# Patient Record
Sex: Male | Born: 1940 | Race: White | Hispanic: No | Marital: Married | Smoking: Former smoker
Health system: Southern US, Community
[De-identification: ages and names within clinical notes are randomized; demographics above are authoritative.]

## PROBLEM LIST (undated history)

## (undated) DIAGNOSIS — E785 Hyperlipidemia, unspecified: Secondary | ICD-10-CM

## (undated) DIAGNOSIS — I4891 Unspecified atrial fibrillation: Secondary | ICD-10-CM

## (undated) DIAGNOSIS — I10 Essential (primary) hypertension: Secondary | ICD-10-CM

---

## 2002-01-16 ENCOUNTER — Encounter (HOSPITAL_COMMUNITY): Admission: RE | Admit: 2002-01-16 | Discharge: 2002-04-16 | Payer: Self-pay | Admitting: Internal Medicine

## 2002-07-09 ENCOUNTER — Ambulatory Visit (HOSPITAL_COMMUNITY): Admission: RE | Admit: 2002-07-09 | Discharge: 2002-07-09 | Payer: Self-pay | Admitting: Gastroenterology

## 2002-07-09 ENCOUNTER — Encounter (INDEPENDENT_AMBULATORY_CARE_PROVIDER_SITE_OTHER): Payer: Self-pay | Admitting: Specialist

## 2002-08-31 ENCOUNTER — Encounter (HOSPITAL_COMMUNITY): Admission: RE | Admit: 2002-08-31 | Discharge: 2002-11-29 | Payer: Self-pay | Admitting: Internal Medicine

## 2003-03-31 ENCOUNTER — Encounter (HOSPITAL_COMMUNITY): Admission: RE | Admit: 2003-03-31 | Discharge: 2003-06-29 | Payer: Self-pay | Admitting: Internal Medicine

## 2003-10-01 ENCOUNTER — Encounter (HOSPITAL_COMMUNITY): Admission: RE | Admit: 2003-10-01 | Discharge: 2003-12-30 | Payer: Self-pay | Admitting: Internal Medicine

## 2004-06-28 ENCOUNTER — Encounter (HOSPITAL_COMMUNITY): Admission: RE | Admit: 2004-06-28 | Discharge: 2004-09-26 | Payer: Self-pay | Admitting: Internal Medicine

## 2005-03-12 ENCOUNTER — Encounter (HOSPITAL_COMMUNITY): Admission: RE | Admit: 2005-03-12 | Discharge: 2005-03-13 | Payer: Self-pay | Admitting: Internal Medicine

## 2005-07-27 ENCOUNTER — Encounter (HOSPITAL_COMMUNITY): Admission: RE | Admit: 2005-07-27 | Discharge: 2005-10-25 | Payer: Self-pay | Admitting: Internal Medicine

## 2006-01-24 ENCOUNTER — Ambulatory Visit (HOSPITAL_COMMUNITY): Admission: RE | Admit: 2006-01-24 | Discharge: 2006-01-24 | Payer: Self-pay | Admitting: Internal Medicine

## 2006-07-19 ENCOUNTER — Encounter (HOSPITAL_COMMUNITY): Admission: RE | Admit: 2006-07-19 | Discharge: 2006-10-08 | Payer: Self-pay | Admitting: Internal Medicine

## 2006-10-11 ENCOUNTER — Encounter (HOSPITAL_COMMUNITY): Admission: RE | Admit: 2006-10-11 | Discharge: 2007-01-09 | Payer: Self-pay | Admitting: Internal Medicine

## 2007-03-07 ENCOUNTER — Encounter (HOSPITAL_COMMUNITY): Admission: RE | Admit: 2007-03-07 | Discharge: 2007-05-05 | Payer: Self-pay | Admitting: Internal Medicine

## 2007-05-20 ENCOUNTER — Encounter (HOSPITAL_COMMUNITY): Admission: RE | Admit: 2007-05-20 | Discharge: 2007-08-01 | Payer: Self-pay | Admitting: Internal Medicine

## 2007-09-17 ENCOUNTER — Encounter (HOSPITAL_COMMUNITY): Admission: RE | Admit: 2007-09-17 | Discharge: 2007-09-25 | Payer: Self-pay | Admitting: Internal Medicine

## 2008-01-23 ENCOUNTER — Encounter (HOSPITAL_COMMUNITY): Admission: RE | Admit: 2008-01-23 | Discharge: 2008-03-31 | Payer: Self-pay | Admitting: Internal Medicine

## 2008-04-27 ENCOUNTER — Encounter (HOSPITAL_COMMUNITY): Admission: RE | Admit: 2008-04-27 | Discharge: 2008-07-07 | Payer: Self-pay | Admitting: Internal Medicine

## 2008-08-26 ENCOUNTER — Encounter (HOSPITAL_COMMUNITY): Admission: RE | Admit: 2008-08-26 | Discharge: 2008-11-24 | Payer: Self-pay | Admitting: Internal Medicine

## 2009-01-04 ENCOUNTER — Encounter (HOSPITAL_COMMUNITY): Admission: RE | Admit: 2009-01-04 | Discharge: 2009-03-14 | Payer: Self-pay | Admitting: Internal Medicine

## 2009-04-27 ENCOUNTER — Encounter (HOSPITAL_COMMUNITY): Admission: RE | Admit: 2009-04-27 | Discharge: 2009-07-26 | Payer: Self-pay | Admitting: Internal Medicine

## 2009-10-20 ENCOUNTER — Encounter (HOSPITAL_COMMUNITY): Admission: RE | Admit: 2009-10-20 | Discharge: 2010-01-04 | Payer: Self-pay | Admitting: Internal Medicine

## 2010-04-27 ENCOUNTER — Encounter (HOSPITAL_COMMUNITY): Admission: RE | Admit: 2010-04-27 | Discharge: 2010-06-29 | Payer: Self-pay | Admitting: Internal Medicine

## 2010-10-12 ENCOUNTER — Ambulatory Visit (HOSPITAL_COMMUNITY): Payer: Medicare Other | Attending: Internal Medicine

## 2010-11-13 LAB — CBC
HCT: 45.6 % (ref 39.0–52.0)
Hemoglobin: 15.6 g/dL (ref 13.0–17.0)
MCHC: 34.3 g/dL (ref 30.0–36.0)
MCV: 102.3 fL — ABNORMAL HIGH (ref 78.0–100.0)
Platelets: 170 10*3/uL (ref 150–400)
RBC: 4.46 MIL/uL (ref 4.22–5.81)
RDW: 13.1 % (ref 11.5–15.5)
WBC: 5.2 10*3/uL (ref 4.0–10.5)

## 2011-02-02 ENCOUNTER — Encounter (HOSPITAL_COMMUNITY): Payer: Medicare Other | Attending: Internal Medicine

## 2011-04-26 LAB — DIFFERENTIAL
Basophils Absolute: 0
Basophils Relative: 1
Eosinophils Absolute: 0.2
Eosinophils Relative: 4
Lymphocytes Relative: 31
Lymphs Abs: 1.6
Monocytes Absolute: 0.5
Monocytes Relative: 9
Neutro Abs: 2.9
Neutrophils Relative %: 55

## 2011-04-26 LAB — CBC
HCT: 43.9
Hemoglobin: 15.6
MCHC: 35.5
MCV: 100.5 — ABNORMAL HIGH
Platelets: 167
RBC: 4.37
RDW: 13.2
WBC: 5.2

## 2011-04-30 LAB — CBC
HCT: 46.9
Hemoglobin: 16
MCHC: 34.1
MCV: 102.9 — ABNORMAL HIGH
Platelets: 156
RBC: 4.56
RDW: 13
WBC: 5.8

## 2011-05-09 LAB — CBC
HCT: 43.7
Hemoglobin: 15.3
MCHC: 35
MCV: 99.8
Platelets: 169
RBC: 4.38
RDW: 13
WBC: 5.9

## 2011-05-25 ENCOUNTER — Ambulatory Visit (HOSPITAL_COMMUNITY)
Admission: RE | Admit: 2011-05-25 | Discharge: 2011-05-25 | Disposition: A | Discharge status: Home / Self Care | Payer: Medicare Other | Source: Ambulatory Visit | Attending: Internal Medicine | Admitting: Internal Medicine

## 2011-09-27 DIAGNOSIS — Z86010 Personal history of colonic polyps: Secondary | ICD-10-CM | POA: Diagnosis not present

## 2011-09-27 DIAGNOSIS — Z1331 Encounter for screening for depression: Secondary | ICD-10-CM | POA: Diagnosis not present

## 2011-09-27 DIAGNOSIS — I10 Essential (primary) hypertension: Secondary | ICD-10-CM | POA: Diagnosis not present

## 2011-09-27 DIAGNOSIS — E7800 Pure hypercholesterolemia, unspecified: Secondary | ICD-10-CM | POA: Diagnosis not present

## 2011-10-26 ENCOUNTER — Other Ambulatory Visit (HOSPITAL_COMMUNITY): Payer: Self-pay | Admitting: *Deleted

## 2011-10-30 ENCOUNTER — Encounter (HOSPITAL_COMMUNITY)
Admission: RE | Admit: 2011-10-30 | Discharge: 2011-10-30 | Disposition: A | Discharge status: Home / Self Care | Payer: Medicare Other | Source: Ambulatory Visit | Attending: Internal Medicine | Admitting: Internal Medicine

## 2011-10-30 MED ORDER — LIDOCAINE HCL (PF) 1 % IJ SOLN
INTRAMUSCULAR | Status: AC
  Filled 2011-10-30: qty 5

## 2011-10-30 MED ORDER — LIDOCAINE HCL 2 % IJ SOLN
0.1000 mL | INTRAMUSCULAR | Status: DC
  Filled 2011-10-30: qty 0.1

## 2011-10-30 NOTE — Progress Notes (Signed)
Left ac accessed for phlebotomy.  Numbed area with lidocaine prior to procedure per md order and removed 250cc of blood.  Pt tolerated procedure well.

## 2011-11-05 ENCOUNTER — Other Ambulatory Visit: Payer: Self-pay | Admitting: Dermatology

## 2011-11-05 DIAGNOSIS — L570 Actinic keratosis: Secondary | ICD-10-CM | POA: Diagnosis not present

## 2011-11-05 DIAGNOSIS — D485 Neoplasm of uncertain behavior of skin: Secondary | ICD-10-CM | POA: Diagnosis not present

## 2011-11-05 DIAGNOSIS — D239 Other benign neoplasm of skin, unspecified: Secondary | ICD-10-CM | POA: Diagnosis not present

## 2011-11-05 DIAGNOSIS — L820 Inflamed seborrheic keratosis: Secondary | ICD-10-CM | POA: Diagnosis not present

## 2011-11-05 DIAGNOSIS — Z85828 Personal history of other malignant neoplasm of skin: Secondary | ICD-10-CM | POA: Diagnosis not present

## 2011-11-19 ENCOUNTER — Other Ambulatory Visit: Payer: Self-pay | Admitting: Gastroenterology

## 2011-11-19 DIAGNOSIS — D126 Benign neoplasm of colon, unspecified: Secondary | ICD-10-CM | POA: Diagnosis not present

## 2011-11-19 DIAGNOSIS — Z09 Encounter for follow-up examination after completed treatment for conditions other than malignant neoplasm: Secondary | ICD-10-CM | POA: Diagnosis not present

## 2011-11-19 DIAGNOSIS — D129 Benign neoplasm of anus and anal canal: Secondary | ICD-10-CM | POA: Diagnosis not present

## 2011-11-19 DIAGNOSIS — Z86010 Personal history of colonic polyps: Secondary | ICD-10-CM | POA: Diagnosis not present

## 2011-11-19 DIAGNOSIS — D128 Benign neoplasm of rectum: Secondary | ICD-10-CM | POA: Diagnosis not present

## 2012-02-14 ENCOUNTER — Other Ambulatory Visit: Payer: Self-pay | Admitting: Internal Medicine

## 2012-02-15 ENCOUNTER — Encounter (HOSPITAL_COMMUNITY)
Admission: RE | Admit: 2012-02-15 | Discharge: 2012-02-15 | Disposition: A | Discharge status: Home / Self Care | Payer: Medicare Other | Source: Ambulatory Visit | Attending: Internal Medicine | Admitting: Internal Medicine

## 2012-02-15 LAB — POCT HEMOGLOBIN-HEMACUE: Hemoglobin: 15.2 g/dL (ref 13.0–17.0)

## 2012-02-15 MED ORDER — LIDOCAINE HCL 1 % IJ SOLN
2.0000 mL | Freq: Once | INTRAMUSCULAR | Status: DC
  Filled 2012-02-15: qty 2

## 2012-02-15 MED ORDER — LIDOCAINE HCL (PF) 1 % IJ SOLN
INTRAMUSCULAR | Status: AC
  Administered 2012-02-15: 2 mL via INTRADERMAL
  Filled 2012-02-15: qty 2

## 2012-02-15 NOTE — Progress Notes (Signed)
Phelbotomized 250 ml blood from left A/C after numbing with 1% lidocaine. Tol well.

## 2012-04-01 DIAGNOSIS — E7800 Pure hypercholesterolemia, unspecified: Secondary | ICD-10-CM | POA: Diagnosis not present

## 2012-04-01 DIAGNOSIS — M7660 Achilles tendinitis, unspecified leg: Secondary | ICD-10-CM | POA: Diagnosis not present

## 2012-04-01 DIAGNOSIS — I10 Essential (primary) hypertension: Secondary | ICD-10-CM | POA: Diagnosis not present

## 2012-04-09 DIAGNOSIS — M25579 Pain in unspecified ankle and joints of unspecified foot: Secondary | ICD-10-CM | POA: Diagnosis not present

## 2012-06-19 ENCOUNTER — Other Ambulatory Visit (HOSPITAL_COMMUNITY): Payer: Self-pay | Admitting: *Deleted

## 2012-06-20 ENCOUNTER — Ambulatory Visit (HOSPITAL_COMMUNITY)
Admission: RE | Admit: 2012-06-20 | Discharge: 2012-06-20 | Disposition: A | Discharge status: Home / Self Care | Payer: Medicare Other | Source: Ambulatory Visit | Attending: Internal Medicine | Admitting: Internal Medicine

## 2012-06-20 DIAGNOSIS — Z5189 Encounter for other specified aftercare: Secondary | ICD-10-CM | POA: Insufficient documentation

## 2012-06-20 LAB — POCT HEMOGLOBIN-HEMACUE: Hemoglobin: 16.9 g/dL (ref 13.0–17.0)

## 2012-06-20 MED ORDER — LIDOCAINE HCL (PF) 1 % IJ SOLN: 2.0000 mL | Freq: Once | INTRAMUSCULAR | Status: DC

## 2012-06-20 NOTE — Progress Notes (Signed)
HGB 16.9 per Hemocue: Theraputic phlebotomy complete per order accessing right antecub.; pt. Tolerated well

## 2012-08-20 ENCOUNTER — Other Ambulatory Visit: Payer: Self-pay | Admitting: Dermatology

## 2012-08-20 DIAGNOSIS — D0420 Carcinoma in situ of skin of unspecified ear and external auricular canal: Secondary | ICD-10-CM | POA: Diagnosis not present

## 2012-10-01 ENCOUNTER — Other Ambulatory Visit: Payer: Self-pay | Admitting: Internal Medicine

## 2012-10-01 DIAGNOSIS — I10 Essential (primary) hypertension: Secondary | ICD-10-CM | POA: Diagnosis not present

## 2012-10-23 DIAGNOSIS — D0420 Carcinoma in situ of skin of unspecified ear and external auricular canal: Secondary | ICD-10-CM | POA: Diagnosis not present

## 2012-10-30 ENCOUNTER — Other Ambulatory Visit: Payer: Self-pay | Admitting: Internal Medicine

## 2012-11-03 ENCOUNTER — Other Ambulatory Visit: Payer: Self-pay | Admitting: Internal Medicine

## 2012-11-06 ENCOUNTER — Inpatient Hospital Stay (HOSPITAL_COMMUNITY): Admission: RE | Admit: 2012-11-06 | Payer: Medicare Other | Source: Ambulatory Visit

## 2012-11-06 ENCOUNTER — Encounter (HOSPITAL_COMMUNITY)
Admission: RE | Admit: 2012-11-06 | Discharge: 2012-11-06 | Disposition: A | Discharge status: Home / Self Care | Payer: Medicare Other | Source: Ambulatory Visit | Attending: Internal Medicine | Admitting: Internal Medicine

## 2012-11-06 LAB — POCT HEMOGLOBIN-HEMACUE: Hemoglobin: 14.7 g/dL (ref 13.0–17.0)

## 2012-11-06 MED ORDER — LIDOCAINE HCL 2 % IJ SOLN
0.1000 mL | Freq: Once | INTRAMUSCULAR | Status: DC
  Filled 2012-11-06: qty 10

## 2012-11-06 MED ORDER — LIDOCAINE HCL (PF) 1 % IJ SOLN
0.1000 mL | Freq: Once | INTRAMUSCULAR | Status: AC
  Administered 2012-11-06: 0.1 mL via INTRADERMAL

## 2012-11-06 MED ORDER — LIDOCAINE HCL (PF) 1 % IJ SOLN
INTRAMUSCULAR | Status: AC
  Filled 2012-11-06: qty 2

## 2012-11-06 NOTE — Progress Notes (Signed)
Phlebotomy orders entered into [facility] by MD's office in the discharge orders.   Performed hemocue on patient per order.  Hgb 14.7.  Proceeded with phlebotomy, first injected lidocaine to Right AC, then phlebotomized 450cc of blood waster per order. Patient tolerated well.  VSS.

## 2013-02-19 ENCOUNTER — Other Ambulatory Visit: Payer: Self-pay | Admitting: Internal Medicine

## 2013-02-20 ENCOUNTER — Encounter (HOSPITAL_COMMUNITY)
Admission: RE | Admit: 2013-02-20 | Discharge: 2013-02-20 | Disposition: A | Discharge status: Home / Self Care | Payer: Medicare Other | Source: Ambulatory Visit | Attending: Internal Medicine | Admitting: Internal Medicine

## 2013-02-20 MED ORDER — LIDOCAINE HCL (PF) 1 % IJ SOLN
INTRAMUSCULAR | Status: AC
  Administered 2013-02-20: 2 mL
  Filled 2013-02-20: qty 2

## 2013-02-23 LAB — POCT HEMOGLOBIN-HEMACUE: Hemoglobin: 15.5 g/dL (ref 13.0–17.0)

## 2013-04-08 DIAGNOSIS — I10 Essential (primary) hypertension: Secondary | ICD-10-CM | POA: Diagnosis not present

## 2013-04-16 DIAGNOSIS — S9030XA Contusion of unspecified foot, initial encounter: Secondary | ICD-10-CM | POA: Diagnosis not present

## 2013-05-19 ENCOUNTER — Encounter (HOSPITAL_COMMUNITY)
Admission: RE | Admit: 2013-05-19 | Discharge: 2013-05-19 | Disposition: A | Discharge status: Home / Self Care | Payer: Medicare Other | Source: Ambulatory Visit | Attending: Internal Medicine | Admitting: Internal Medicine

## 2013-05-19 MED ORDER — LIDOCAINE HCL (PF) 1 % IJ SOLN: 2.0000 mL | Freq: Once | INTRAMUSCULAR | Status: AC

## 2013-05-19 MED ORDER — LIDOCAINE HCL (PF) 1 % IJ SOLN
INTRAMUSCULAR | Status: AC
  Administered 2013-05-19: 2 mL via INTRADERMAL
  Filled 2013-05-19: qty 2

## 2013-05-19 NOTE — Progress Notes (Addendum)
Numbed rt AC per pt request with lidacaine and order 500cc blood withdrawn pt tolerated well. HGB 16.1

## 2013-05-20 LAB — POCT HEMOGLOBIN-HEMACUE: Hemoglobin: 16.1 g/dL (ref 13.0–17.0)

## 2013-07-15 ENCOUNTER — Other Ambulatory Visit (HOSPITAL_COMMUNITY): Payer: Self-pay | Admitting: *Deleted

## 2013-07-16 ENCOUNTER — Encounter (HOSPITAL_COMMUNITY)
Admission: RE | Admit: 2013-07-16 | Discharge: 2013-07-16 | Disposition: A | Discharge status: Home / Self Care | Payer: Medicare Other | Source: Ambulatory Visit | Attending: Internal Medicine | Admitting: Internal Medicine

## 2013-07-16 LAB — POCT HEMOGLOBIN-HEMACUE: Hemoglobin: 15.6 g/dL (ref 13.0–17.0)

## 2013-07-16 MED ORDER — LIDOCAINE HCL (PF) 1 % IJ SOLN: 2.0000 mL | Freq: Once | INTRAMUSCULAR | Status: DC

## 2013-07-16 MED ORDER — LIDOCAINE HCL (PF) 1 % IJ SOLN
INTRAMUSCULAR | Status: AC
  Administered 2013-07-16: 0.1 mL via INTRADERMAL
  Filled 2013-07-16: qty 2

## 2013-07-16 NOTE — Progress Notes (Signed)
Hgb 15.6.  Lidocaine used in Right AC at 0950 for therapeutic phlebotomy.  Removed of blood waste.  Patient tolerated well.

## 2013-09-16 ENCOUNTER — Encounter (HOSPITAL_COMMUNITY)
Admission: RE | Admit: 2013-09-16 | Discharge: 2013-09-16 | Disposition: A | Discharge status: Home / Self Care | Payer: Medicare Other | Source: Ambulatory Visit | Attending: Internal Medicine | Admitting: Internal Medicine

## 2013-09-16 LAB — POCT HEMOGLOBIN-HEMACUE: Hemoglobin: 15.2 g/dL (ref 13.0–17.0)

## 2013-09-16 MED ORDER — LIDOCAINE HCL (PF) 1 % IJ SOLN
INTRAMUSCULAR | Status: AC
  Administered 2013-09-16: 0.2 mL via INTRADERMAL
  Filled 2013-09-16: qty 2

## 2013-09-16 MED ORDER — LIDOCAINE HCL 1 % IJ SOLN
0.2000 mg | Freq: Once | INTRAMUSCULAR | Status: DC
  Filled 2013-09-16: qty 0.02

## 2013-09-16 NOTE — Progress Notes (Signed)
1000 500 cc phlebotomy right antecubital after lidocaine 0.2mg . Patient tolerated well.  Taking po fluids

## 2013-11-05 DIAGNOSIS — R059 Cough, unspecified: Secondary | ICD-10-CM | POA: Diagnosis not present

## 2013-11-05 DIAGNOSIS — R05 Cough: Secondary | ICD-10-CM | POA: Diagnosis not present

## 2013-11-05 DIAGNOSIS — Z23 Encounter for immunization: Secondary | ICD-10-CM | POA: Diagnosis not present

## 2013-11-05 DIAGNOSIS — R21 Rash and other nonspecific skin eruption: Secondary | ICD-10-CM | POA: Diagnosis not present

## 2013-11-05 DIAGNOSIS — I10 Essential (primary) hypertension: Secondary | ICD-10-CM | POA: Diagnosis not present

## 2013-11-11 ENCOUNTER — Encounter (HOSPITAL_COMMUNITY)
Admission: RE | Admit: 2013-11-11 | Discharge: 2013-11-11 | Disposition: A | Discharge status: Home / Self Care | Payer: Medicare Other | Source: Ambulatory Visit | Attending: Internal Medicine | Admitting: Internal Medicine

## 2013-11-11 ENCOUNTER — Encounter (HOSPITAL_COMMUNITY): Payer: Medicare Other

## 2013-11-11 LAB — POCT HEMOGLOBIN-HEMACUE: Hemoglobin: 14.6 g/dL (ref 13.0–17.0)

## 2013-11-11 MED ORDER — LIDOCAINE HCL 1 % IJ SOLN
2.0000 mL | Freq: Once | INTRAMUSCULAR | Status: DC
  Filled 2013-11-11: qty 2

## 2013-11-11 MED ORDER — LIDOCAINE HCL (PF) 1 % IJ SOLN
INTRAMUSCULAR | Status: AC
  Administered 2013-11-11: 2 mL
  Filled 2013-11-11: qty 2

## 2013-11-11 NOTE — Progress Notes (Signed)
Attempted phlebotomy to right arm unsuccessfully.  Used lidocaine to left AC and phlebotomized 500cc of blood and pt tolerated procedure well.

## 2013-11-13 DIAGNOSIS — R233 Spontaneous ecchymoses: Secondary | ICD-10-CM | POA: Diagnosis not present

## 2013-11-13 DIAGNOSIS — R609 Edema, unspecified: Secondary | ICD-10-CM | POA: Diagnosis not present

## 2013-11-18 DIAGNOSIS — M79609 Pain in unspecified limb: Secondary | ICD-10-CM | POA: Diagnosis not present

## 2014-01-18 ENCOUNTER — Other Ambulatory Visit (HOSPITAL_COMMUNITY): Payer: Self-pay | Admitting: *Deleted

## 2014-01-19 ENCOUNTER — Encounter (HOSPITAL_COMMUNITY)
Admission: RE | Admit: 2014-01-19 | Discharge: 2014-01-19 | Disposition: A | Discharge status: Home / Self Care | Payer: Medicare Other | Source: Ambulatory Visit | Attending: Internal Medicine | Admitting: Internal Medicine

## 2014-01-19 LAB — POCT HEMOGLOBIN-HEMACUE: Hemoglobin: 15.2 g/dL (ref 13.0–17.0)

## 2014-01-19 MED ORDER — LIDOCAINE HCL (PF) 1 % IJ SOLN
INTRAMUSCULAR | Status: AC
  Administered 2014-01-19: 2 mL
  Filled 2014-01-19: qty 2

## 2014-01-19 MED ORDER — LIDOCAINE HCL (PF) 1 % IJ SOLN: 2.0000 mL | Freq: Once | INTRAMUSCULAR | Status: DC

## 2014-01-19 NOTE — Progress Notes (Signed)
Therapeutic phlebotomy performed without complication.  500cc blood removed.  Pt tolerated with no c/o or complication.  Will monitor prior to discharge.  Po's offered.

## 2014-03-03 DIAGNOSIS — M25579 Pain in unspecified ankle and joints of unspecified foot: Secondary | ICD-10-CM | POA: Diagnosis not present

## 2014-03-24 ENCOUNTER — Other Ambulatory Visit (HOSPITAL_COMMUNITY): Payer: Self-pay

## 2014-03-25 ENCOUNTER — Encounter (HOSPITAL_COMMUNITY): Payer: Medicare Other

## 2014-04-01 ENCOUNTER — Encounter (HOSPITAL_COMMUNITY)
Admission: RE | Admit: 2014-04-01 | Discharge: 2014-04-01 | Disposition: A | Discharge status: Home / Self Care | Payer: Medicare Other | Source: Ambulatory Visit | Attending: Internal Medicine | Admitting: Internal Medicine

## 2014-04-01 LAB — POCT HEMOGLOBIN-HEMACUE: Hemoglobin: 13.9 g/dL (ref 13.0–17.0)

## 2014-04-01 MED ORDER — LIDOCAINE HCL (PF) 1 % IJ SOLN: 2.0000 mL | Freq: Once | INTRAMUSCULAR | Status: AC | PRN

## 2014-04-01 MED ORDER — LIDOCAINE HCL (PF) 1 % IJ SOLN
INTRAMUSCULAR | Status: AC
  Administered 2014-04-01: 2 mL via INTRADERMAL
  Filled 2014-04-01: qty 2

## 2014-04-01 NOTE — Progress Notes (Signed)
500 cc phlebotomy right lateral AC.  Patient tolerated well.  Taking po fluids.

## 2014-05-10 ENCOUNTER — Ambulatory Visit
Admission: RE | Admit: 2014-05-10 | Discharge: 2014-05-10 | Disposition: A | Discharge status: Home / Self Care | Payer: Medicare Other | Source: Ambulatory Visit | Attending: Nurse Practitioner | Admitting: Nurse Practitioner

## 2014-05-10 ENCOUNTER — Other Ambulatory Visit: Payer: Self-pay | Admitting: Nurse Practitioner

## 2014-05-10 DIAGNOSIS — R05 Cough: Secondary | ICD-10-CM

## 2014-05-10 DIAGNOSIS — J189 Pneumonia, unspecified organism: Secondary | ICD-10-CM | POA: Diagnosis not present

## 2014-05-10 DIAGNOSIS — I10 Essential (primary) hypertension: Secondary | ICD-10-CM | POA: Diagnosis not present

## 2014-05-10 DIAGNOSIS — J181 Lobar pneumonia, unspecified organism: Secondary | ICD-10-CM | POA: Diagnosis not present

## 2014-05-10 DIAGNOSIS — R21 Rash and other nonspecific skin eruption: Secondary | ICD-10-CM | POA: Diagnosis not present

## 2014-05-10 DIAGNOSIS — R059 Cough, unspecified: Secondary | ICD-10-CM

## 2014-05-10 DIAGNOSIS — Z23 Encounter for immunization: Secondary | ICD-10-CM | POA: Diagnosis not present

## 2014-05-17 DIAGNOSIS — J189 Pneumonia, unspecified organism: Secondary | ICD-10-CM | POA: Diagnosis not present

## 2014-05-17 DIAGNOSIS — H6690 Otitis media, unspecified, unspecified ear: Secondary | ICD-10-CM | POA: Diagnosis not present

## 2014-06-04 ENCOUNTER — Other Ambulatory Visit (HOSPITAL_COMMUNITY): Payer: Self-pay | Admitting: *Deleted

## 2014-06-07 ENCOUNTER — Other Ambulatory Visit: Payer: Self-pay | Admitting: Nurse Practitioner

## 2014-06-07 ENCOUNTER — Encounter (HOSPITAL_COMMUNITY)
Admission: RE | Admit: 2014-06-07 | Discharge: 2014-06-07 | Disposition: A | Discharge status: Home / Self Care | Payer: Medicare Other | Source: Ambulatory Visit | Attending: Internal Medicine | Admitting: Internal Medicine

## 2014-06-07 ENCOUNTER — Ambulatory Visit
Admission: RE | Admit: 2014-06-07 | Discharge: 2014-06-07 | Disposition: A | Discharge status: Home / Self Care | Payer: Medicare Other | Source: Ambulatory Visit | Attending: Nurse Practitioner | Admitting: Nurse Practitioner

## 2014-06-07 DIAGNOSIS — I10 Essential (primary) hypertension: Secondary | ICD-10-CM

## 2014-06-07 DIAGNOSIS — R918 Other nonspecific abnormal finding of lung field: Secondary | ICD-10-CM | POA: Diagnosis not present

## 2014-06-07 DIAGNOSIS — J189 Pneumonia, unspecified organism: Secondary | ICD-10-CM | POA: Diagnosis not present

## 2014-06-07 LAB — POCT HEMOGLOBIN-HEMACUE: Hemoglobin: 15.5 g/dL (ref 13.0–17.0)

## 2014-06-07 MED ORDER — LIDOCAINE HCL (PF) 1 % IJ SOLN
2.0000 mL | Freq: Once | INTRAMUSCULAR | Status: AC | PRN
  Administered 2014-06-07: 2 mL via INTRADERMAL

## 2014-06-07 MED ORDER — LIDOCAINE HCL (PF) 1 % IJ SOLN
INTRAMUSCULAR | Status: DC
Start: 2014-06-07 — End: 2014-06-08
  Filled 2014-06-07: qty 2

## 2014-06-07 NOTE — Progress Notes (Signed)
Phlebotomy complete using right antecub.  500 ml; pt. Tolerated well

## 2014-07-29 ENCOUNTER — Other Ambulatory Visit: Payer: Self-pay | Admitting: Internal Medicine

## 2014-07-29 ENCOUNTER — Ambulatory Visit
Admission: RE | Admit: 2014-07-29 | Discharge: 2014-07-29 | Disposition: A | Discharge status: Home / Self Care | Payer: Medicare Other | Source: Ambulatory Visit | Attending: Internal Medicine | Admitting: Internal Medicine

## 2014-07-29 DIAGNOSIS — J189 Pneumonia, unspecified organism: Secondary | ICD-10-CM

## 2014-07-29 DIAGNOSIS — J449 Chronic obstructive pulmonary disease, unspecified: Secondary | ICD-10-CM | POA: Diagnosis not present

## 2014-08-09 ENCOUNTER — Other Ambulatory Visit (HOSPITAL_COMMUNITY): Payer: Self-pay | Admitting: *Deleted

## 2014-08-10 ENCOUNTER — Encounter (HOSPITAL_COMMUNITY)
Admission: RE | Admit: 2014-08-10 | Discharge: 2014-08-10 | Disposition: A | Discharge status: Home / Self Care | Payer: Medicare Other | Source: Ambulatory Visit | Attending: Internal Medicine | Admitting: Internal Medicine

## 2014-08-10 LAB — POCT HEMOGLOBIN-HEMACUE: Hemoglobin: 15.4 g/dL (ref 13.0–17.0)

## 2014-08-10 MED ORDER — LIDOCAINE HCL (PF) 1 % IJ SOLN
INTRAMUSCULAR | Status: AC
  Administered 2014-08-10: 0.3 mL via INTRADERMAL
  Filled 2014-08-10: qty 2

## 2014-08-10 MED ORDER — LIDOCAINE HCL (PF) 1 % IJ SOLN
2.0000 mL | Freq: Once | INTRAMUSCULAR | Status: AC
  Administered 2014-08-10: 0.3 mL via INTRADERMAL

## 2014-08-10 NOTE — Progress Notes (Signed)
1015 500 cc phlebotomy right antecubital fossae after ID lidocaine.  Patient tolerated well.  Taking po fluids

## 2014-10-05 DIAGNOSIS — R310 Gross hematuria: Secondary | ICD-10-CM | POA: Diagnosis not present

## 2014-10-05 DIAGNOSIS — R351 Nocturia: Secondary | ICD-10-CM | POA: Diagnosis not present

## 2014-10-05 DIAGNOSIS — N390 Urinary tract infection, site not specified: Secondary | ICD-10-CM | POA: Diagnosis not present

## 2014-10-05 DIAGNOSIS — N281 Cyst of kidney, acquired: Secondary | ICD-10-CM | POA: Diagnosis not present

## 2014-10-05 DIAGNOSIS — N401 Enlarged prostate with lower urinary tract symptoms: Secondary | ICD-10-CM | POA: Diagnosis not present

## 2014-10-06 ENCOUNTER — Other Ambulatory Visit (HOSPITAL_COMMUNITY): Payer: Self-pay | Admitting: *Deleted

## 2014-10-07 ENCOUNTER — Inpatient Hospital Stay (HOSPITAL_COMMUNITY): Admission: RE | Admit: 2014-10-07 | Payer: Medicare Other | Source: Ambulatory Visit

## 2014-10-07 DIAGNOSIS — N390 Urinary tract infection, site not specified: Secondary | ICD-10-CM | POA: Diagnosis not present

## 2014-10-07 DIAGNOSIS — R310 Gross hematuria: Secondary | ICD-10-CM | POA: Diagnosis not present

## 2014-10-15 DIAGNOSIS — R310 Gross hematuria: Secondary | ICD-10-CM | POA: Diagnosis not present

## 2014-10-15 DIAGNOSIS — I700 Atherosclerosis of aorta: Secondary | ICD-10-CM | POA: Diagnosis not present

## 2014-10-15 DIAGNOSIS — R591 Generalized enlarged lymph nodes: Secondary | ICD-10-CM | POA: Diagnosis not present

## 2014-11-09 ENCOUNTER — Encounter (HOSPITAL_COMMUNITY)
Admission: RE | Admit: 2014-11-09 | Discharge: 2014-11-09 | Disposition: A | Discharge status: Home / Self Care | Payer: Medicare Other | Source: Ambulatory Visit | Attending: Internal Medicine | Admitting: Internal Medicine

## 2014-11-09 LAB — POCT HEMOGLOBIN-HEMACUE: Hemoglobin: 14.5 g/dL (ref 13.0–17.0)

## 2014-11-09 MED ORDER — LIDOCAINE HCL (PF) 1 % IJ SOLN
2.0000 mL | Freq: Once | INTRAMUSCULAR | Status: AC
  Administered 2014-11-09: 2 mL via INTRADERMAL

## 2014-11-09 MED ORDER — LIDOCAINE HCL (PF) 1 % IJ SOLN
INTRAMUSCULAR | Status: AC
  Filled 2014-11-09: qty 2

## 2014-11-09 NOTE — Progress Notes (Signed)
500 ml phlebotomy completed using right antecubital fossoe; pt. Tolerated well; HGB per hemocue 14.5 prior to phlebotomy

## 2014-11-12 DIAGNOSIS — R351 Nocturia: Secondary | ICD-10-CM | POA: Diagnosis not present

## 2014-11-12 DIAGNOSIS — N401 Enlarged prostate with lower urinary tract symptoms: Secondary | ICD-10-CM | POA: Diagnosis not present

## 2014-11-30 DIAGNOSIS — Z86010 Personal history of colonic polyps: Secondary | ICD-10-CM | POA: Diagnosis not present

## 2014-11-30 DIAGNOSIS — Z0000 Encounter for general adult medical examination without abnormal findings: Secondary | ICD-10-CM | POA: Diagnosis not present

## 2014-11-30 DIAGNOSIS — I10 Essential (primary) hypertension: Secondary | ICD-10-CM | POA: Diagnosis not present

## 2014-11-30 DIAGNOSIS — E780 Pure hypercholesterolemia: Secondary | ICD-10-CM | POA: Diagnosis not present

## 2015-02-08 ENCOUNTER — Encounter (HOSPITAL_COMMUNITY): Payer: Medicare Other

## 2015-02-10 ENCOUNTER — Encounter (HOSPITAL_COMMUNITY): Payer: Medicare Other

## 2015-02-11 ENCOUNTER — Other Ambulatory Visit (HOSPITAL_COMMUNITY): Payer: Self-pay | Admitting: *Deleted

## 2015-02-14 ENCOUNTER — Encounter (HOSPITAL_COMMUNITY)
Admission: RE | Admit: 2015-02-14 | Discharge: 2015-02-14 | Disposition: A | Discharge status: Home / Self Care | Payer: Medicare Other | Source: Ambulatory Visit | Attending: Internal Medicine | Admitting: Internal Medicine

## 2015-02-14 LAB — POCT HEMOGLOBIN-HEMACUE: Hemoglobin: 16.6 g/dL (ref 13.0–17.0)

## 2015-02-14 MED ORDER — LIDOCAINE HCL (PF) 1 % IJ SOLN
INTRAMUSCULAR | Status: AC
  Filled 2015-02-14: qty 2

## 2015-02-14 MED ORDER — LIDOCAINE HCL (PF) 1 % IJ SOLN
2.0000 mL | Freq: Once | INTRAMUSCULAR | Status: AC
  Administered 2015-02-14: 0.2 mL via INTRADERMAL

## 2015-02-14 NOTE — Progress Notes (Signed)
Phlebotomy of 500cc via left antecubital hemocue >11; tolerated well

## 2015-05-10 ENCOUNTER — Encounter (HOSPITAL_COMMUNITY): Payer: Medicare Other

## 2015-05-13 ENCOUNTER — Other Ambulatory Visit (HOSPITAL_COMMUNITY): Payer: Self-pay | Admitting: *Deleted

## 2015-05-17 ENCOUNTER — Encounter (HOSPITAL_COMMUNITY)
Admission: RE | Admit: 2015-05-17 | Discharge: 2015-05-17 | Disposition: A | Discharge status: Home / Self Care | Payer: Medicare Other | Source: Ambulatory Visit | Attending: Internal Medicine | Admitting: Internal Medicine

## 2015-05-17 LAB — POCT HEMOGLOBIN-HEMACUE: Hemoglobin: 16.5 g/dL (ref 13.0–17.0)

## 2015-05-17 MED ORDER — LIDOCAINE HCL (PF) 1 % IJ SOLN
INTRAMUSCULAR | Status: AC
  Administered 2015-05-17: 2 mL via INTRADERMAL
  Filled 2015-05-17: qty 2

## 2015-06-02 DIAGNOSIS — Z23 Encounter for immunization: Secondary | ICD-10-CM | POA: Diagnosis not present

## 2015-06-02 DIAGNOSIS — H6123 Impacted cerumen, bilateral: Secondary | ICD-10-CM | POA: Diagnosis not present

## 2015-06-02 DIAGNOSIS — I10 Essential (primary) hypertension: Secondary | ICD-10-CM | POA: Diagnosis not present

## 2015-06-26 ENCOUNTER — Emergency Department (HOSPITAL_COMMUNITY)
Admission: EM | Admit: 2015-06-26 | Discharge: 2015-06-26 | Disposition: A | Discharge status: Home / Self Care | Payer: Medicare Other | Attending: Emergency Medicine | Admitting: Emergency Medicine

## 2015-06-26 ENCOUNTER — Encounter (HOSPITAL_COMMUNITY): Payer: Self-pay | Admitting: *Deleted

## 2015-06-26 ENCOUNTER — Emergency Department (HOSPITAL_COMMUNITY): Payer: Medicare Other

## 2015-06-26 DIAGNOSIS — S066X9A Traumatic subarachnoid hemorrhage with loss of consciousness of unspecified duration, initial encounter: Secondary | ICD-10-CM | POA: Diagnosis not present

## 2015-06-26 DIAGNOSIS — S066X0A Traumatic subarachnoid hemorrhage without loss of consciousness, initial encounter: Secondary | ICD-10-CM | POA: Insufficient documentation

## 2015-06-26 DIAGNOSIS — N390 Urinary tract infection, site not specified: Secondary | ICD-10-CM | POA: Diagnosis not present

## 2015-06-26 DIAGNOSIS — F10120 Alcohol abuse with intoxication, uncomplicated: Secondary | ICD-10-CM | POA: Insufficient documentation

## 2015-06-26 DIAGNOSIS — I10 Essential (primary) hypertension: Secondary | ICD-10-CM | POA: Insufficient documentation

## 2015-06-26 DIAGNOSIS — W1839XA Other fall on same level, initial encounter: Secondary | ICD-10-CM | POA: Insufficient documentation

## 2015-06-26 DIAGNOSIS — Z87891 Personal history of nicotine dependence: Secondary | ICD-10-CM | POA: Insufficient documentation

## 2015-06-26 DIAGNOSIS — Z79899 Other long term (current) drug therapy: Secondary | ICD-10-CM | POA: Insufficient documentation

## 2015-06-26 DIAGNOSIS — S61409A Unspecified open wound of unspecified hand, initial encounter: Secondary | ICD-10-CM | POA: Diagnosis not present

## 2015-06-26 DIAGNOSIS — Y998 Other external cause status: Secondary | ICD-10-CM | POA: Insufficient documentation

## 2015-06-26 DIAGNOSIS — S022XXA Fracture of nasal bones, initial encounter for closed fracture: Secondary | ICD-10-CM | POA: Diagnosis not present

## 2015-06-26 DIAGNOSIS — Y92000 Kitchen of unspecified non-institutional (private) residence as  the place of occurrence of the external cause: Secondary | ICD-10-CM | POA: Diagnosis not present

## 2015-06-26 DIAGNOSIS — S199XXA Unspecified injury of neck, initial encounter: Secondary | ICD-10-CM | POA: Diagnosis not present

## 2015-06-26 DIAGNOSIS — Y9389 Activity, other specified: Secondary | ICD-10-CM | POA: Diagnosis not present

## 2015-06-26 DIAGNOSIS — I609 Nontraumatic subarachnoid hemorrhage, unspecified: Secondary | ICD-10-CM

## 2015-06-26 DIAGNOSIS — F10920 Alcohol use, unspecified with intoxication, uncomplicated: Secondary | ICD-10-CM

## 2015-06-26 DIAGNOSIS — T148 Other injury of unspecified body region: Secondary | ICD-10-CM | POA: Diagnosis not present

## 2015-06-26 DIAGNOSIS — R55 Syncope and collapse: Secondary | ICD-10-CM | POA: Insufficient documentation

## 2015-06-26 DIAGNOSIS — S0990XA Unspecified injury of head, initial encounter: Secondary | ICD-10-CM | POA: Diagnosis present

## 2015-06-26 HISTORY — DX: Essential (primary) hypertension: I10

## 2015-06-26 LAB — CBC WITH DIFFERENTIAL/PLATELET
Basophils Absolute: 0 10*3/uL (ref 0.0–0.1)
Basophils Relative: 0 %
Eosinophils Absolute: 0.1 10*3/uL (ref 0.0–0.7)
Eosinophils Relative: 1 %
HCT: 45.1 % (ref 39.0–52.0)
Hemoglobin: 15.8 g/dL (ref 13.0–17.0)
Lymphocytes Relative: 16 %
Lymphs Abs: 1.5 10*3/uL (ref 0.7–4.0)
MCH: 35.9 pg — ABNORMAL HIGH (ref 26.0–34.0)
MCHC: 35 g/dL (ref 30.0–36.0)
MCV: 102.5 fL — ABNORMAL HIGH (ref 78.0–100.0)
Monocytes Absolute: 0.4 10*3/uL (ref 0.1–1.0)
Monocytes Relative: 5 %
Neutro Abs: 7.4 10*3/uL (ref 1.7–7.7)
Neutrophils Relative %: 78 %
Platelets: 170 10*3/uL (ref 150–400)
RBC: 4.4 MIL/uL (ref 4.22–5.81)
RDW: 13 % (ref 11.5–15.5)
WBC: 9.5 10*3/uL (ref 4.0–10.5)

## 2015-06-26 LAB — URINALYSIS, ROUTINE W REFLEX MICROSCOPIC
Bilirubin Urine: NEGATIVE
Glucose, UA: NEGATIVE mg/dL
Ketones, ur: NEGATIVE mg/dL
Nitrite: NEGATIVE
Protein, ur: NEGATIVE mg/dL
Specific Gravity, Urine: 1.011 (ref 1.005–1.030)
pH: 5.5 (ref 5.0–8.0)

## 2015-06-26 LAB — COMPREHENSIVE METABOLIC PANEL
ALT: 53 U/L (ref 17–63)
AST: 53 U/L — ABNORMAL HIGH (ref 15–41)
Albumin: 4.3 g/dL (ref 3.5–5.0)
Alkaline Phosphatase: 65 U/L (ref 38–126)
Anion gap: 8 (ref 5–15)
BUN: 14 mg/dL (ref 6–20)
CO2: 24 mmol/L (ref 22–32)
Calcium: 9.4 mg/dL (ref 8.9–10.3)
Chloride: 107 mmol/L (ref 101–111)
Creatinine, Ser: 0.94 mg/dL (ref 0.61–1.24)
GFR calc Af Amer: >60 mL/min (ref >60)
GFR calc non Af Amer: >60 mL/min (ref >60)
Glucose, Bld: 109 mg/dL — ABNORMAL HIGH (ref 65–99)
Potassium: 3.9 mmol/L (ref 3.5–5.1)
Sodium: 139 mmol/L (ref 135–145)
Total Bilirubin: 0.7 mg/dL (ref 0.3–1.2)
Total Protein: 7.6 g/dL (ref 6.5–8.1)

## 2015-06-26 LAB — URINE MICROSCOPIC-ADD ON
RBC / HPF: NONE SEEN RBC/hpf (ref 0–5)
Squamous Epithelial / LPF: NONE SEEN

## 2015-06-26 LAB — RAPID URINE DRUG SCREEN, HOSP PERFORMED
Amphetamines: NOT DETECTED
Barbiturates: NOT DETECTED
Benzodiazepines: NOT DETECTED
Cocaine: NOT DETECTED
Opiates: NOT DETECTED
Tetrahydrocannabinol: NOT DETECTED

## 2015-06-26 LAB — ETHANOL: Alcohol, Ethyl (B): 237 mg/dL — ABNORMAL HIGH (ref <5)

## 2015-06-26 LAB — TROPONIN I: Troponin I: <0.03 ng/mL (ref <0.031)

## 2015-06-26 MED ORDER — SODIUM CHLORIDE 0.9 % IV SOLN
INTRAVENOUS | Status: DC
  Administered 2015-06-26: 19:00:00 via INTRAVENOUS

## 2015-06-26 MED ORDER — HYDROCODONE-ACETAMINOPHEN 5-325 MG PO TABS: 1.0000 | ORAL_TABLET | ORAL | Status: DC | PRN

## 2015-06-26 MED ORDER — CEPHALEXIN 500 MG PO CAPS: 500.0000 mg | ORAL_CAPSULE | Freq: Four times a day (QID) | ORAL | Status: DC

## 2015-06-26 NOTE — ED Notes (Signed)
Cleaned blood from patient head no laceration noted.

## 2015-06-26 NOTE — ED Provider Notes (Signed)
CSN: [id number]     Arrival date & time 06/26/15  1751 History   First MD Initiated Contact with Patient 06/26/15 1815     Chief Complaint  Patient presents with  . Fall    with head injury      (Consider location/radiation/quality/duration/timing/severity/associated sxs/prior Treatment) HPI Comments: Patient here after having a syncopal event just prior to arrival. Patient is to drinking one [employer] and states that the next thing he knew he woke up on the floor. Did not have any food prior to drinking this. Denies any other drug ingestions. Would not specify how large the martini was. States he does drink daily. Patient denies any symptoms prior to the event. When he awoke he had swelling to the right side of his face as well as laceration to the left parietal scalp. Denies any pain from below the neck. No recent history of illnesses. No recent medication changes  Patient is a 74 y.o. male presenting with fall. The history is provided by the patient and the spouse.  Fall    Past Medical History  Diagnosis Date  . Hypertension    No past surgical history on file. History reviewed. No pertinent family history. Social History  Substance Use Topics  . Smoking status: Former Research scientist (life sciences)  . Smokeless tobacco: None  . Alcohol Use: 1.2 oz/week    2 Glasses of wine per week     Comment: 2 glasses wine daily     Review of Systems  All other systems reviewed and are negative.     Allergies  Review of patient's allergies indicates no known allergies.  Home Medications   Prior to Admission medications   Medication Sig Start Date End Date Taking? Authorizing Provider  amLODipine-atorvastatin (CADUET) 5-10 MG per tablet Take 1 tablet by mouth daily. 5 mg daily    Historical Provider, MD   BP 149/96 mmHg  Pulse 73  Temp(Src) 97.4 F (36.3 C) (Oral)  Ht 6' (1.829 m)  Wt 93.441 kg  BMI 27.93 kg/m2  SpO2 98% Physical Exam  Constitutional: He is oriented to person, place,  and time. He appears well-developed and well-nourished.  Non-toxic appearance. No distress.  HENT:  Head: Normocephalic and atraumatic.    Eyes: Conjunctivae, EOM and lids are normal. Pupils are equal, round, and reactive to light.  Patient denies diplopia. His extraocular muscles are intact  Neck: Normal range of motion. Neck supple. No spinous process tenderness and no muscular tenderness present. No tracheal deviation present. No thyroid mass present.  Cardiovascular: Normal rate, regular rhythm and normal heart sounds.  Exam reveals no gallop.   No murmur heard. Pulmonary/Chest: Effort normal and breath sounds normal. No stridor. No respiratory distress. He has no decreased breath sounds. He has no wheezes. He has no rhonchi. He has no rales.  Abdominal: Soft. Normal appearance and bowel sounds are normal. He exhibits no distension. There is no tenderness. There is no rebound and no CVA tenderness.  Musculoskeletal: Normal range of motion. He exhibits no edema or tenderness.  Neurological: He is alert and oriented to person, place, and time. He has normal strength. No cranial nerve deficit or sensory deficit. GCS eye subscore is 4. GCS verbal subscore is 5. GCS motor subscore is 6.  Skin: Skin is warm and dry. No abrasion and no rash noted.  Psychiatric: He has a normal mood and affect. His speech is normal and behavior is normal.  Nursing note and vitals reviewed.   ED Course  Procedures (including critical care time) Labs Review Labs Reviewed  CBC WITH DIFFERENTIAL/PLATELET  COMPREHENSIVE METABOLIC PANEL  ETHANOL  URINE RAPID DRUG SCREEN, HOSP PERFORMED  TROPONIN I  URINALYSIS, ROUTINE W REFLEX MICROSCOPIC (NOT AT [facility])    Imaging Review No results found. I have personally reviewed and evaluated these images and lab results as part of my medical decision-making.   EKG Interpretation   Date/Time:  Sunday June 26 2015 18:30:16 EST Ventricular Rate:  66 PR Interval:   216 QRS Duration: 88 QT Interval:  397 QTC Calculation: 416 R Axis:   23 Text Interpretation:  Sinus rhythm Atrial premature complex Borderline  prolonged PR interval Confirmed by Zenia Resides  MD, Keenan Dimitrov ([id number]) on  06/26/2015 7:04:11 PM      MDM   Final diagnoses:  None    Patient's head CT results noted. Discuss with neurosurgeon on call, Dr. Saintclair Halsted, will follow-up with patient in 2 days. Patient's nasal fracture noted in will be given referral to ENT on call. This is UTI to be treated with Keflex. Suspect the constipation syncope worse from excessive alcohol intake. He is not orthostatic here.  9:14 PM At time of discharge, patient is alert and oriented 3. The patient's right Eye was reexamined he has no evidence of hyphema. He has no visual complaints of at this time.   [name], MD 06/26/15 2114

## 2015-06-26 NOTE — ED Notes (Signed)
Patient found down by wife sitting in kitchen floor covered in blood.  Unsure of how long patient was down before got there.  Per EMS patient has laceration right posterior head.  Swollen right eye and nose. Skin tear to right arm.  Patient lost consciousness  And does not remember what happened. Patient had 1 martini prior to incident

## 2015-06-26 NOTE — Discharge Instructions (Signed)
Alcohol Intoxication Alcohol intoxication occurs when the amount of alcohol that a person has consumed impairs his or her ability to mentally and physically function. Alcohol directly impairs the normal chemical activity of the brain. Drinking large amounts of alcohol can lead to changes in mental function and behavior, and it can cause many physical effects that can be harmful.  Alcohol intoxication can range in severity from mild to very severe. Various factors can affect the level of intoxication that occurs, such as the person's age, gender, weight, frequency of alcohol consumption, and the presence of other medical conditions (such as diabetes, seizures, or heart conditions). Dangerous levels of alcohol intoxication may occur when people drink large amounts of alcohol in a short period (binge drinking). Alcohol can also be especially dangerous when combined with certain prescription medicines or "recreational" drugs. SIGNS AND SYMPTOMS Some common signs and symptoms of mild alcohol intoxication include:  Loss of coordination.  Changes in mood and behavior.  Impaired judgment.  Slurred speech. As alcohol intoxication progresses to more severe levels, other signs and symptoms will appear. These may include:  Vomiting.  Confusion and impaired memory.  Slowed breathing.  Seizures.  Loss of consciousness. DIAGNOSIS  Your health care provider will take a medical history and perform a physical exam. You will be asked about the amount and type of alcohol you have consumed. Blood tests will be done to measure the concentration of alcohol in your blood. In many places, your blood alcohol level must be lower than 80 mg/dL (0.08%) to legally drive. However, many dangerous effects of alcohol can occur at much lower levels.  TREATMENT  People with alcohol intoxication often do not require treatment. Most of the effects of alcohol intoxication are temporary, and they go away as the alcohol naturally  leaves the body. Your health care provider will monitor your condition until you are stable enough to go home. Fluids are sometimes given through an IV access tube to help prevent dehydration.  HOME CARE INSTRUCTIONS  Do not drive after drinking alcohol.  Stay hydrated. Drink enough water and fluids to keep your urine clear or pale yellow. Avoid caffeine.   Only take over-the-counter or prescription medicines as directed by your health care provider.  SEEK MEDICAL CARE IF:   You have persistent vomiting.   You do not feel better after a few days.  You have frequent alcohol intoxication. Your health care provider can help determine if you should see a substance use treatment counselor. SEEK IMMEDIATE MEDICAL CARE IF:   You become shaky or tremble when you try to stop drinking.   You shake uncontrollably (seizure).   You throw up (vomit) blood. This may be bright red or may look like black coffee grounds.   You have blood in your stool. This may be bright red or may appear as a black, tarry, bad smelling stool.   You become lightheaded or faint.  MAKE SURE YOU:   Understand these instructions.  Will watch your condition.  Will get help right away if you are not doing well or get worse.   This information is not intended to replace advice given to you by your health care provider. Make sure you discuss any questions you have with your health care provider.   Document Released: 04/25/2005 Document Revised: 03/18/2013 Document Reviewed: 12/19/2012 Elsevier Interactive Patient Education 2016 Elsevier Inc. Subarachnoid Hemorrhage Subarachnoid hemorrhage is bleeding in the area between the brain and the membrane that covers the brain (subarachnoid space). This  increases the pressure on the brain and causes some areas of the brain to be deprived of blood flow. Subarachnoid hemorrhage is a medical emergency that may cause permanent brain damage, stroke, or even death if not  treated.  CAUSES   Head injury.   Ruptured brain aneurysm.   Bleeding from blood vessels that develop abnormally (arteriovenous malformation).   Bleeding disorder.   Use of blood thinners (anticoagulants).  Use of certain drugs, such as cocaine. For some people with subarachnoid hemorrhage, the cause is unknown.  RISK FACTORS  Smoking.  Having high blood pressure (hypertension).  Abusing alcohol.  Being a male, especially being of post-menopausal age.  Having a family history of disease in the blood vessels of the brain (cerebrovascular disease).  Having certain genetic syndromes that result in kidney disease or connective tissue disease. SIGNS AND SYMPTOMS   A sudden, severe headache with no known cause. The headache is often described as the worst headache ever experienced.  Nausea or vomiting, especially when combined with other symptoms such as a headache.  Sudden weakness or numbness of the face, arm, or leg, especially on one side of the body.  Sudden trouble walking or difficulty moving arms or legs.  Sudden confusion.  Sudden personality changes.  Trouble speaking (aphasia) or understanding.  Difficulty swallowing.  Sudden trouble seeing in one or both eyes.  Double vision.  Dizziness.  Loss of balance or coordination.  Intolerance to light.  Stiff neck. DIAGNOSIS  Your health care provider will perform a physical exam and ask about your symptoms. If a subarachnoid hemorrhage is suspected, various tests may be ordered. These tests may include:   A CT scan.  An MRI.  A cerebral angiogram.  A spinal tap (lumbar puncture).  Blood tests. TREATMENT  Immediate treatment in the hospital is often required to reduce the risk of brain damage. Treatment will depend on the cause of the bleeding, where it is located, and the extent of the bleeding and damage. The goals of treatment include stopping the bleeding, repairing the cause of bleeding,  providing relief of symptoms, and preventing problems.   Medicines may be given to:  Lower blood pressure (antihypertensives).  Relieve pain (analgesics).  Relieve nausea or vomiting.  Surgery may also be needed to stop the bleeding, repair the cause of the bleeding, or remove the blood.  Rehabilitation may be needed to improve any cognitive and day-to-day functions impaired by the condition. Further treatment depends on the duration, severity, and cause of your symptoms. Physical, speech, and occupational therapists will assess you and work to improve any functions impaired by the subarachnoid hemorrhage. Measures will be taken to prevent short-term and long-term problems, including infection from breathing foreign material into the lungs (aspiration pneumonia), blood clots in the legs, bedsores, and falls. HOME CARE INSTRUCTIONS After your hospitalization or inpatient rehabilitation is completed and you are well enough to go home, it is important to prevent a reoccurrence. Take these steps to help prevent this:  Take medicines only as directed by your health care provider.  If swallow studies have determined that your swallowing reflex is present, you should eat healthy foods. A diet low in salt (sodium), saturated fat, trans fat, and cholesterol may be recommended to manage high blood pressure. Foods may need to be a special consistency (soft or pureed), or small bites may need to be taken in order to avoid aspirating or choking.  Rest and limit activities or movements as directed by your health care  provider.  Do not use any tobacco products including cigarettes, chewing tobacco, or electronic cigarettes. If you need help quitting, ask your health care provider.  Limit alcohol intake to no more than 1 drink per day for nonpregnant women and 2 drinks per day for men. One drink equals 12 ounces of beer, 5 ounces of wine, or 1 ounces of hard liquor.  Make any other lifestyle changes as  directed by your health care provider.  Monitor and record your blood pressure as directed by your health care provider.  A safe home environment is important to reduce the risk of falls. Your health care provider may arrange for specialists to evaluate your home. Having grab bars in the bedroom and bathroom is often important. Your health care provider may arrange for special equipment to be used at home, such as raised toilets and a seat for the shower.  Physical, occupational, and speech therapy. Ongoing therapy may be needed to maximize your recovery after a subarachnoid bleed. If you have been advised to use a walker or a cane, use it at all times. Be sure to keep your therapy appointments.  Keep all follow-up visits with your health care provider and other specialists. This includes any referrals, physical therapy, and rehabilitation. SEEK IMMEDIATE MEDICAL CARE IF:   You suddenly have a sudden, severe headache with no known cause.  You have nausea or vomiting occurring with another symptom.  You have sudden weakness or numbness of the face, arm, or leg, especially on one side of the body.  You have sudden trouble walking or difficulty moving arms or legs.  You have sudden confusion.  You have trouble speaking (aphasia) or understanding.  You have sudden trouble seeing in one or both eyes.  You have a sudden loss of balance or coordination.  You have a stiff neck.  You have difficulty breathing.  You have a partial or total loss of consciousness. Any of these symptoms may represent a serious problem that is an emergency. Do not wait to see if the symptoms will go away. Get medical help right away. Call your local emergency services (911 in [place] Do not drive yourself to the hospital.   This information is not intended to replace advice given to you by your health care provider. Make sure you discuss any questions you have with your health care provider.   Document  Released: 06/02/2004 Document Revised: 04/06/2015 Document Reviewed: 08/29/2012 Elsevier Interactive Patient Education 2016 Elsevier Inc. Nasal Fracture A nasal fracture is a break or crack in the bones or cartilage of the nose. Minor breaks do not require treatment. These breaks usually heal on their own after about one month. Serious breaks may require surgery. CAUSES This injury is usually caused by a blunt injury to the nose. This type of injury often occurs from:  Contact sports.  Car accidents.  Falls.  Getting punched. SYMPTOMS Symptoms of this injury include:  Pain.  Swelling of the nose.  Bleeding from the nose.  Bruising around the nose or eyes. This may include having black eyes.  Crooked appearance of the nose. DIAGNOSIS This injury may be diagnosed with a physical exam. The health care provider will gently feel the nose for signs of broken bones. He or she will look inside the nostrils to make sure that there is not a blood-filled swelling on the dividing wall between the nostrils (septal hematoma). X-rays of the nose may not show a nasal fracture even when one is present. In  some cases, X-rays or a CT scan may be done 1-5 days after the injury. Sometimes, the health care provider will want to wait until the swelling has gone down. TREATMENT Often, minor fractures that have caused no deformity do not require treatment. More serious fractures in which bones have moved out of position may require surgery, which will take place after the swelling is gone. Surgery will stabilize and align the fracture. In some cases, a health care provider may be able to reposition the bones without surgery. This may be done in the health care provider's office after medicine is given to numb the area (local anesthetic). HOME CARE INSTRUCTIONS  If directed, apply ice to the injured area:  Put ice in a plastic bag.  Place a towel between your skin and the bag.  Leave the ice on for 20  minutes, 2-3 times per day.  Take over-the-counter and prescription medicines only as told by your health care provider.  If your nose starts to bleed, sit in an upright position while you squeeze the soft parts of your nose against the dividing wall between your nostrils (septum) for 10 minutes.  Try to avoid blowing your nose.  Return to your normal activities as told by your health care provider. Ask your health care provider what activities are safe for you.  Avoid contact sports for 3-4 weeks or as told by your health care provider.  Keep all follow-up visits as told by your health care provider. This is important. SEEK MEDICAL CARE IF:  Your pain increases or becomes severe.  You continue to have nosebleeds.  The shape of your nose does not return to normal within 5 days.  You have pus draining out of your nose. SEEK IMMEDIATE MEDICAL CARE IF:  You have bleeding from your nose that does not stop after you pinch your nostrils closed for 20 minutes and keep ice on your nose.  You have clear fluid draining out of your nose.  You notice a grape-like swelling on the septum. This swelling is a collection of blood (hematoma) that must be drained to help prevent infection.  You have difficulty moving your eyes.  You have repeated vomiting.   This information is not intended to replace advice given to you by your health care provider. Make sure you discuss any questions you have with your health care provider.   Document Released: 07/13/2000 Document Revised: 04/06/2015 Document Reviewed: 08/23/2014 Elsevier Interactive Patient Education 2016 [employer]. Syncope Syncope is a medical term for fainting or passing out. This means you lose consciousness and drop to the ground. People are generally unconscious for less than 5 minutes. You may have some muscle twitches for up to 15 seconds before waking up and returning to normal. Syncope occurs more often in older adults, but it can  happen to anyone. While most causes of syncope are not dangerous, syncope can be a sign of a serious medical problem. It is important to seek medical care.  CAUSES  Syncope is caused by a sudden drop in blood flow to the brain. The specific cause is often not determined. Factors that can bring on syncope include:  Taking medicines that lower blood pressure.  Sudden changes in posture, such as standing up quickly.  Taking more medicine than prescribed.  Standing in one place for too long.  Seizure disorders.  Dehydration and excessive exposure to heat.  Low blood sugar (hypoglycemia).  Straining to have a bowel movement.  Heart disease, irregular heartbeat,  or other circulatory problems.  Fear, emotional distress, seeing blood, or severe pain. SYMPTOMS  Right before fainting, you may:  Feel dizzy or light-headed.  Feel nauseous.  See all white or all black in your field of vision.  Have cold, clammy skin. DIAGNOSIS  Your health care provider will ask about your symptoms, perform a physical exam, and perform an electrocardiogram (ECG) to record the electrical activity of your heart. Your health care provider may also perform other heart or blood tests to determine the cause of your syncope which may include:  Transthoracic echocardiogram (TTE). During echocardiography, sound waves are used to evaluate how blood flows through your heart.  Transesophageal echocardiogram (TEE).  Cardiac monitoring. This allows your health care provider to monitor your heart rate and rhythm in real time.  Holter monitor. This is a portable device that records your heartbeat and can help diagnose heart arrhythmias. It allows your health care provider to track your heart activity for several days, if needed.  Stress tests by exercise or by giving medicine that makes the heart beat faster. TREATMENT  In most cases, no treatment is needed. Depending on the cause of your syncope, your health care  provider may recommend changing or stopping some of your medicines. HOME CARE INSTRUCTIONS  Have someone stay with you until you feel stable.  Do not drive, use machinery, or play sports until your health care provider says it is okay.  Keep all follow-up appointments as directed by your health care provider.  Lie down right away if you start feeling like you might faint. Breathe deeply and steadily. Wait until all the symptoms have passed.  Drink enough fluids to keep your urine clear or pale yellow.  If you are taking blood pressure or heart medicine, get up slowly and take several minutes to sit and then stand. This can reduce dizziness. SEEK IMMEDIATE MEDICAL CARE IF:   You have a severe headache.  You have unusual pain in the chest, abdomen, or back.  You are bleeding from your mouth or rectum, or you have black or tarry stool.  You have an irregular or very fast heartbeat.  You have pain with breathing.  You have repeated fainting or seizure-like jerking during an episode.  You faint when sitting or lying down.  You have confusion.  You have trouble walking.  You have severe weakness.  You have vision problems. If you fainted, call your local emergency services (911 in [place] Do not drive yourself to the hospital.    This information is not intended to replace advice given to you by your health care provider. Make sure you discuss any questions you have with your health care provider.   Document Released: 07/16/2005 Document Revised: 11/30/2014 Document Reviewed: 09/14/2011 Elsevier Interactive Patient Education 2016 Elsevier Inc. Urinary Tract Infection Urinary tract infections (UTIs) can develop anywhere along your urinary tract. Your urinary tract is your body's drainage system for removing wastes and extra water. Your urinary tract includes two kidneys, two ureters, a bladder, and a urethra. Your kidneys are a pair of bean-shaped organs. Each kidney is about the  size of your fist. They are located below your ribs, one on each side of your spine. CAUSES Infections are caused by microbes, which are microscopic organisms, including fungi, viruses, and bacteria. These organisms are so small that they can only be seen through a microscope. Bacteria are the microbes that most commonly cause UTIs. SYMPTOMS  Symptoms of UTIs may vary by age and  gender of the patient and by the location of the infection. Symptoms in young women typically include a frequent and intense urge to urinate and a painful, burning feeling in the bladder or urethra during urination. Older women and men are more likely to be tired, shaky, and weak and have muscle aches and abdominal pain. A fever may mean the infection is in your kidneys. Other symptoms of a kidney infection include pain in your back or sides below the ribs, nausea, and vomiting. DIAGNOSIS To diagnose a UTI, your caregiver will ask you about your symptoms. Your caregiver will also ask you to provide a urine sample. The urine sample will be tested for bacteria and white blood cells. White blood cells are made by your body to help fight infection. TREATMENT  Typically, UTIs can be treated with medication. Because most UTIs are caused by a bacterial infection, they usually can be treated with the use of antibiotics. The choice of antibiotic and length of treatment depend on your symptoms and the type of bacteria causing your infection. HOME CARE INSTRUCTIONS  If you were prescribed antibiotics, take them exactly as your caregiver instructs you. Finish the medication even if you feel better after you have only taken some of the medication.  Drink enough water and fluids to keep your urine clear or pale yellow.  Avoid caffeine, tea, and carbonated beverages. They tend to irritate your bladder.  Empty your bladder often. Avoid holding urine for long periods of time.  Empty your bladder before and after sexual  intercourse.  After a bowel movement, women should cleanse from front to back. Use each tissue only once. SEEK MEDICAL CARE IF:   You have back pain.  You develop a fever.  Your symptoms do not begin to resolve within 3 days. SEEK IMMEDIATE MEDICAL CARE IF:   You have severe back pain or lower abdominal pain.  You develop chills.  You have nausea or vomiting.  You have continued burning or discomfort with urination. MAKE SURE YOU:   Understand these instructions.  Will watch your condition.  Will get help right away if you are not doing well or get worse.   This information is not intended to replace advice given to you by your health care provider. Make sure you discuss any questions you have with your health care provider.   Document Released: 04/25/2005 Document Revised: 04/06/2015 Document Reviewed: 08/24/2011 Elsevier Interactive Patient Education [employer].

## 2015-06-26 NOTE — ED Notes (Signed)
Bed: WA06 Expected date:  Expected time:  Means of arrival:  Comments: EMS-Fall head lac

## 2015-06-28 DIAGNOSIS — S022XXA Fracture of nasal bones, initial encounter for closed fracture: Secondary | ICD-10-CM | POA: Diagnosis not present

## 2015-06-28 DIAGNOSIS — I609 Nontraumatic subarachnoid hemorrhage, unspecified: Secondary | ICD-10-CM | POA: Diagnosis not present

## 2015-08-16 ENCOUNTER — Encounter (HOSPITAL_COMMUNITY)
Admission: RE | Admit: 2015-08-16 | Discharge: 2015-08-16 | Disposition: A | Discharge status: Home / Self Care | Payer: Medicare Other | Source: Ambulatory Visit | Attending: Internal Medicine | Admitting: Internal Medicine

## 2015-08-16 ENCOUNTER — Other Ambulatory Visit (HOSPITAL_COMMUNITY): Payer: Self-pay | Admitting: *Deleted

## 2015-08-16 LAB — POCT HEMOGLOBIN-HEMACUE: Hemoglobin: 13.9 g/dL (ref 13.0–17.0)

## 2015-08-16 MED ORDER — LIDOCAINE HCL (PF) 1 % IJ SOLN
2.0000 mL | Freq: Once | INTRAMUSCULAR | Status: AC
  Administered 2015-08-16: 2 mL via INTRADERMAL

## 2015-08-16 MED ORDER — LIDOCAINE HCL (PF) 1 % IJ SOLN
INTRAMUSCULAR | Status: AC
  Administered 2015-08-16: 2 mL via INTRADERMAL
  Filled 2015-08-16: qty 2

## 2015-08-16 NOTE — Progress Notes (Signed)
hemocue 13.9. Right AC injected with lidocaine per md order and pt request.  Phlebotomized 500cc and pt tolerated procedure well. VSS upon DC

## 2015-11-07 ENCOUNTER — Other Ambulatory Visit (HOSPITAL_COMMUNITY): Payer: Self-pay | Admitting: *Deleted

## 2015-11-08 ENCOUNTER — Encounter (HOSPITAL_COMMUNITY)
Admission: RE | Admit: 2015-11-08 | Discharge: 2015-11-08 | Disposition: A | Discharge status: Home / Self Care | Payer: Medicare Other | Source: Ambulatory Visit | Attending: Internal Medicine | Admitting: Internal Medicine

## 2015-11-08 LAB — POCT HEMOGLOBIN-HEMACUE: Hemoglobin: 15.6 g/dL (ref 13.0–17.0)

## 2015-11-08 MED ORDER — LIDOCAINE HCL (PF) 1 % IJ SOLN
2.0000 mL | Freq: Once | INTRAMUSCULAR | Status: AC
  Administered 2015-11-08: 2 mL via INTRADERMAL

## 2015-11-08 NOTE — Progress Notes (Signed)
One unit phlebotomy completed using right anecub; pt tolerated well

## 2016-02-01 ENCOUNTER — Other Ambulatory Visit (HOSPITAL_COMMUNITY): Payer: Self-pay | Admitting: *Deleted

## 2016-02-02 ENCOUNTER — Encounter (HOSPITAL_COMMUNITY)
Admission: RE | Admit: 2016-02-02 | Discharge: 2016-02-02 | Disposition: A | Discharge status: Home / Self Care | Payer: Medicare Other | Source: Ambulatory Visit | Attending: Internal Medicine | Admitting: Internal Medicine

## 2016-02-02 LAB — POCT HEMOGLOBIN-HEMACUE: Hemoglobin: 14.4 g/dL (ref 13.0–17.0)

## 2016-02-02 MED ORDER — LIDOCAINE HCL (PF) 1 % IJ SOLN: 2.0000 mL | Freq: Once | INTRAMUSCULAR | Status: DC

## 2016-02-02 NOTE — Progress Notes (Signed)
PT came in today for scheduled therapeutic phlebotomy.  HemoCue was 14.4 prior to procedure.  Right AC was used Lidocaine 1% was administered sq prior to procedure. 500 cc of blood was removed.  Pt tolerated procedure well.  Will continue to monitor

## 2016-02-23 DIAGNOSIS — I10 Essential (primary) hypertension: Secondary | ICD-10-CM | POA: Diagnosis not present

## 2016-02-23 DIAGNOSIS — E7800 Pure hypercholesterolemia, unspecified: Secondary | ICD-10-CM | POA: Diagnosis not present

## 2016-02-23 DIAGNOSIS — Z0000 Encounter for general adult medical examination without abnormal findings: Secondary | ICD-10-CM | POA: Diagnosis not present

## 2016-02-23 DIAGNOSIS — Z1389 Encounter for screening for other disorder: Secondary | ICD-10-CM | POA: Diagnosis not present

## 2016-02-23 DIAGNOSIS — Z86010 Personal history of colonic polyps: Secondary | ICD-10-CM | POA: Diagnosis not present

## 2016-02-23 DIAGNOSIS — Z6830 Body mass index (BMI) 30.0-30.9, adult: Secondary | ICD-10-CM | POA: Diagnosis not present

## 2016-05-07 ENCOUNTER — Other Ambulatory Visit (HOSPITAL_COMMUNITY): Payer: Self-pay | Admitting: *Deleted

## 2016-05-08 ENCOUNTER — Encounter (HOSPITAL_COMMUNITY)
Admission: RE | Admit: 2016-05-08 | Discharge: 2016-05-08 | Disposition: A | Discharge status: Home / Self Care | Payer: Medicare Other | Source: Ambulatory Visit | Attending: Internal Medicine | Admitting: Internal Medicine

## 2016-05-08 LAB — POCT HEMOGLOBIN-HEMACUE: Hemoglobin: 14.9 g/dL (ref 13.0–17.0)

## 2016-05-08 MED ORDER — LIDOCAINE HCL (PF) 1 % IJ SOLN: 2.0000 mL | INTRAMUSCULAR | Status: DC

## 2016-05-08 MED ORDER — LIDOCAINE HCL (PF) 1 % IJ SOLN
INTRAMUSCULAR | Status: AC
  Filled 2016-05-08: qty 2

## 2016-05-08 NOTE — Progress Notes (Signed)
Pt came in today for scheduled therapeutic phlebotomy.  Pt's HemoCue prior to procedure was 14.9.  Right AC was used.  Lidocaine was used.  500 cc of blood was removed.  Pt tolerated procedure well.

## 2016-06-01 IMAGING — CR DG CHEST 2V
2 series · 2 of 2 positions shown · non-contrast
Comparison: None.

CLINICAL DATA: Cough for 4 weeks, productive sputum

EXAM:
CHEST  2 VIEW

[w chest pa]
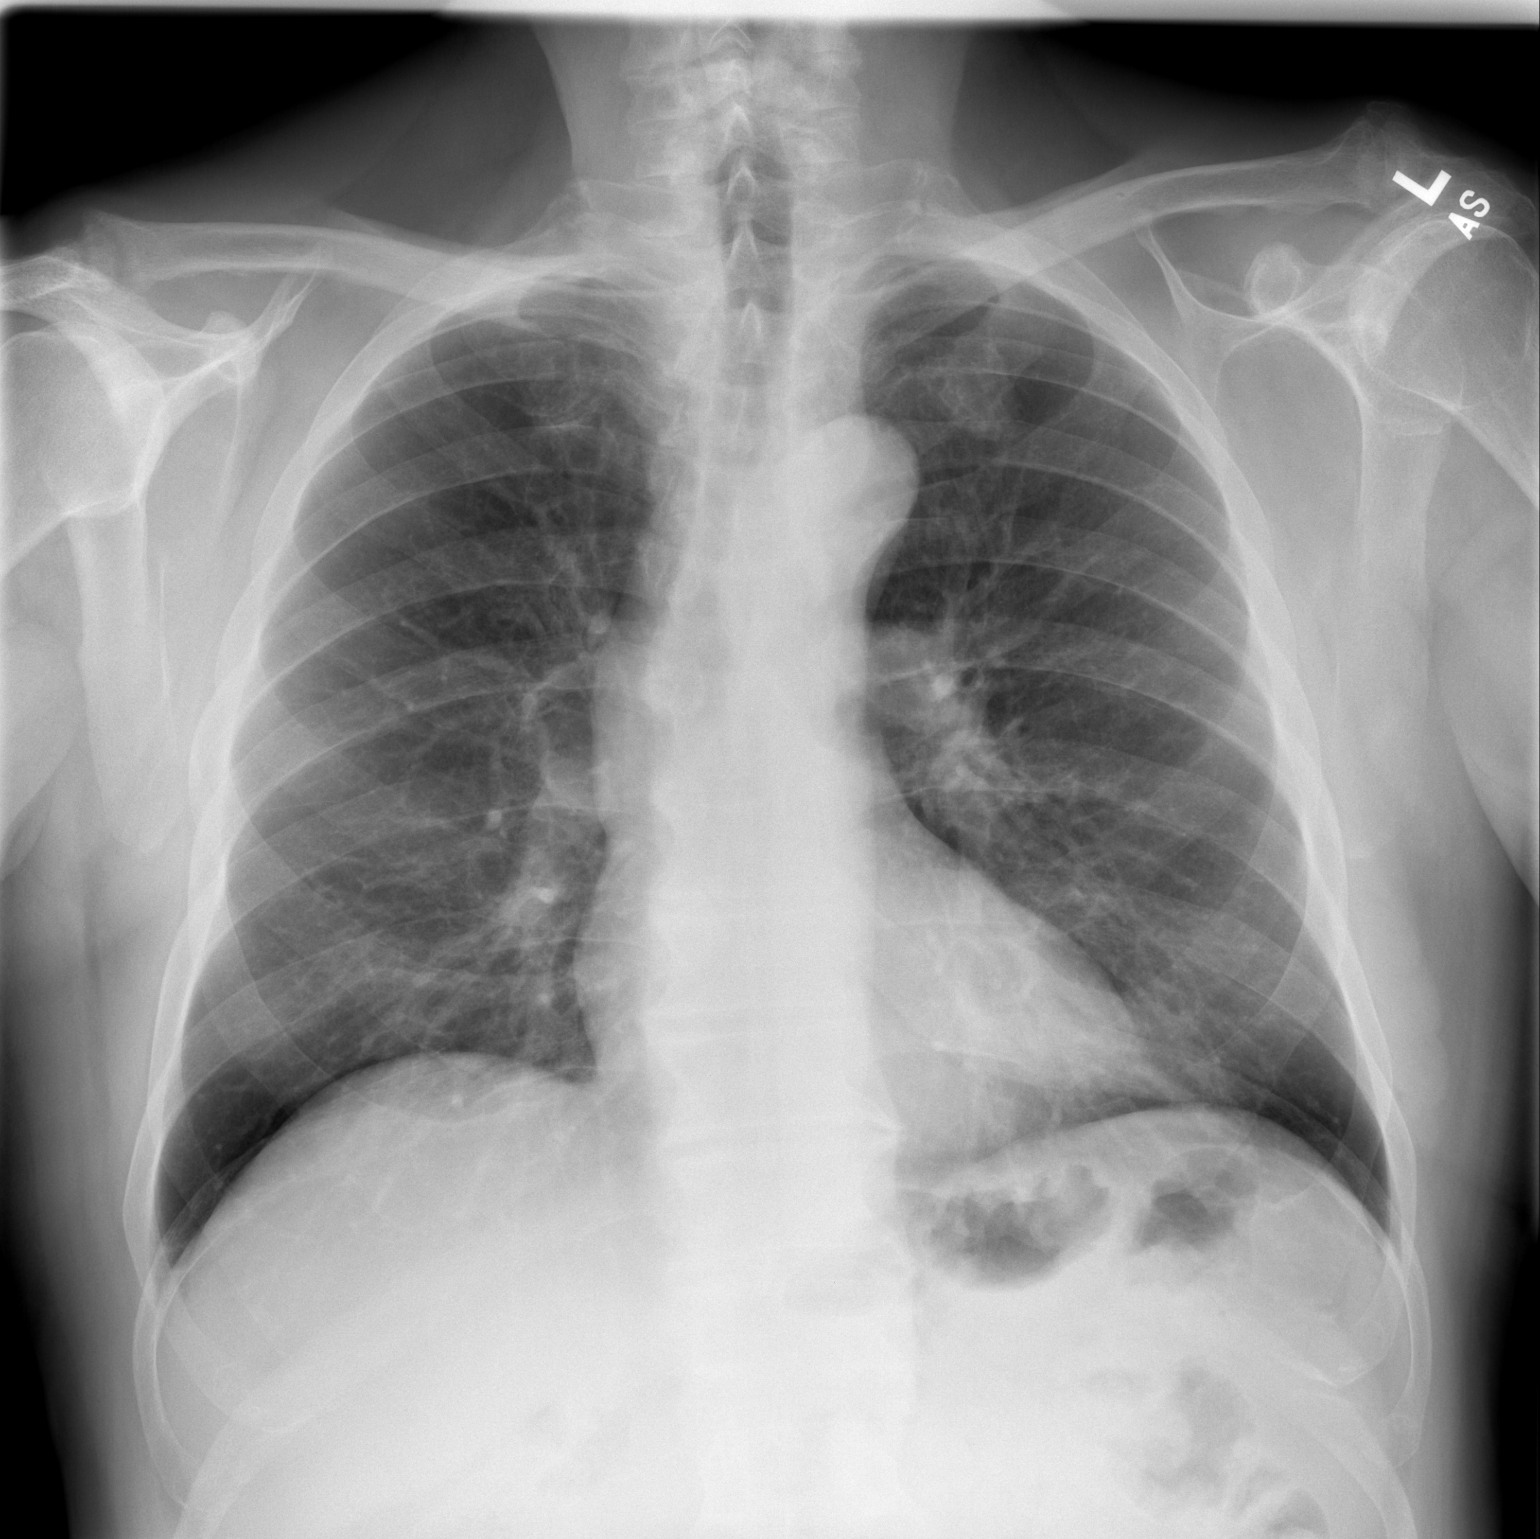

[w chest lat]
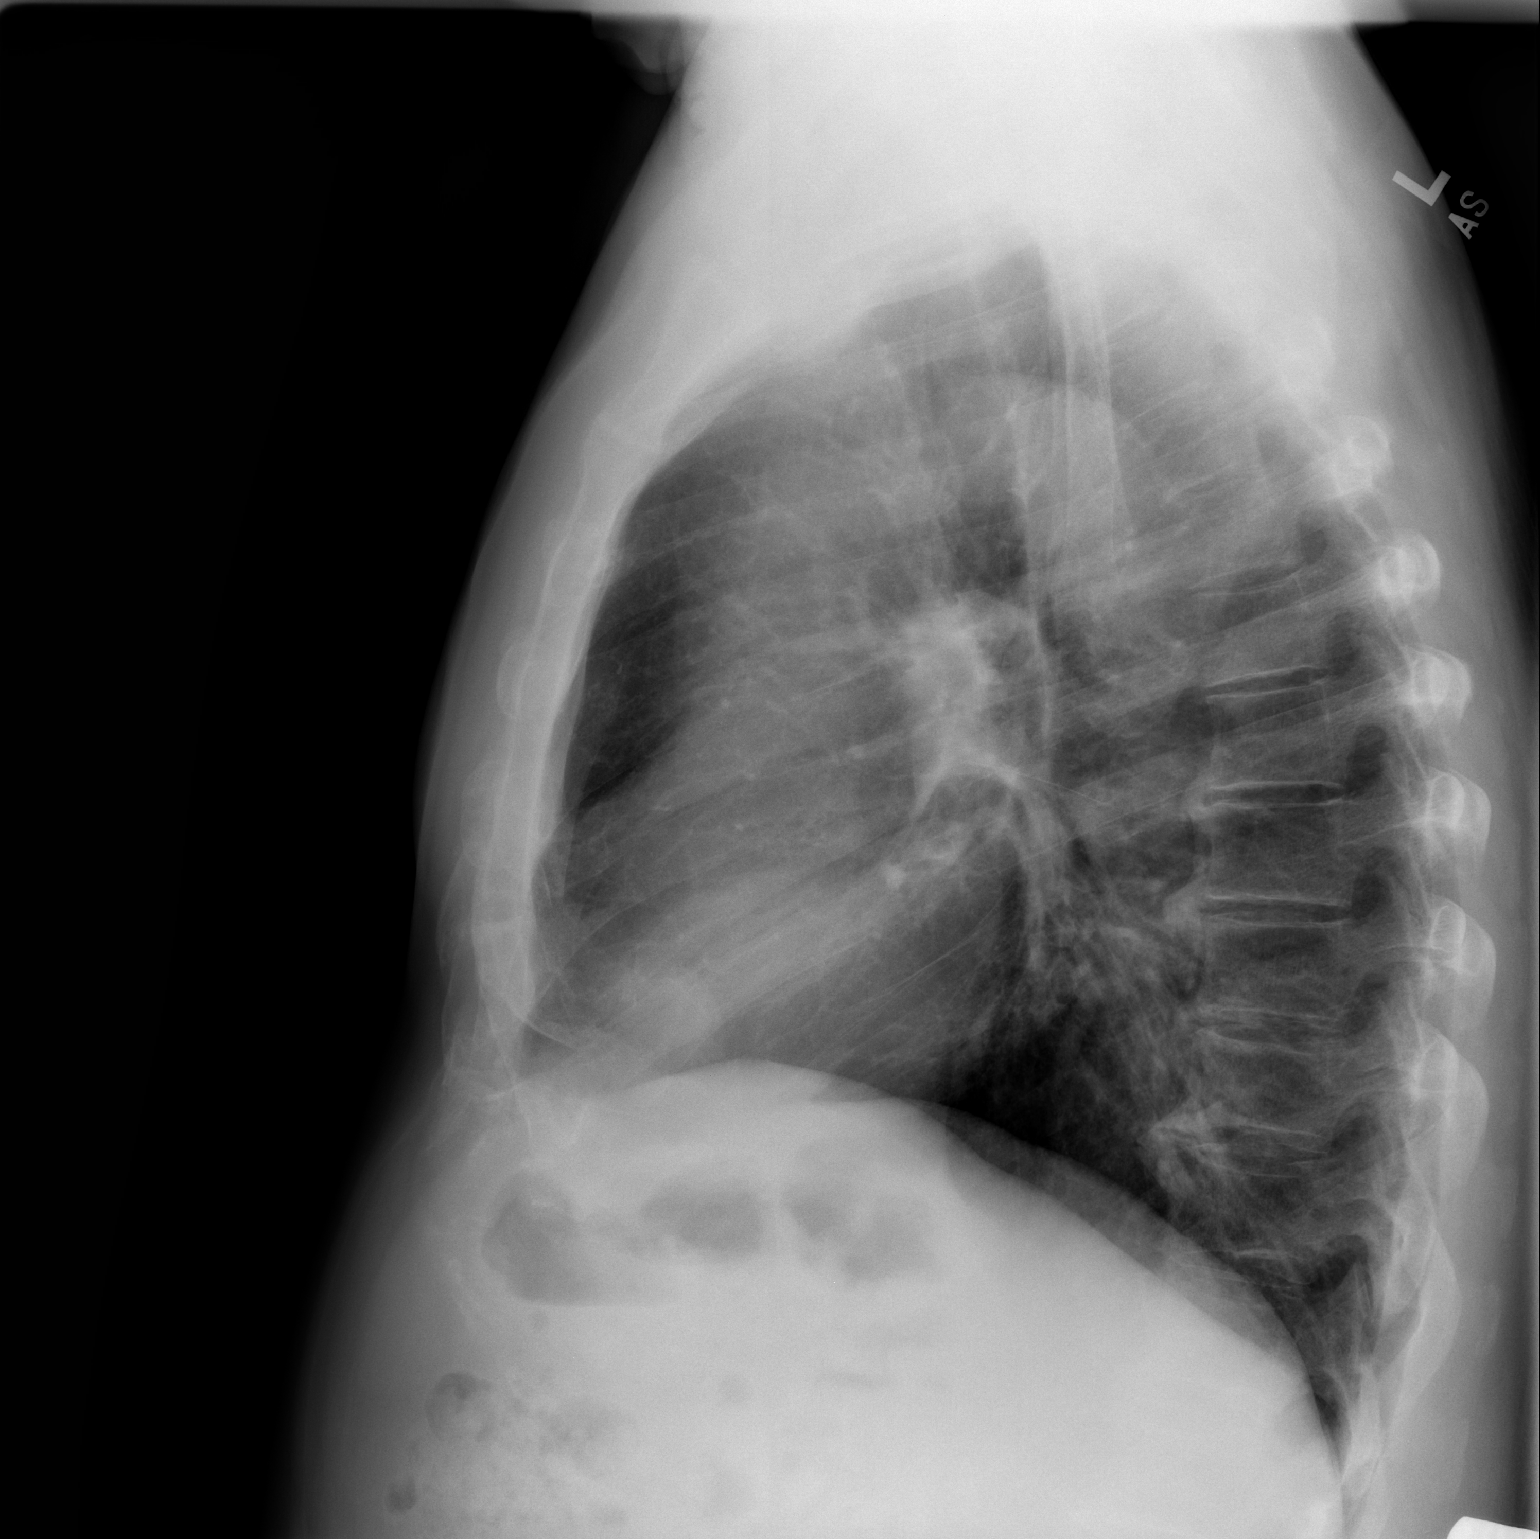

[2 of 2 positions shown; findings below may reference images not displayed]

FINDINGS: There is lingular airspace disease most concerning for pneumonia.
There is no pleural effusion or pneumothorax. The heart and
mediastinal contours are unremarkable.

The osseous structures are unremarkable.
IMPRESSION: Lingular pneumonia. Recommend followup radiography in 4-6 weeks, to
document complete resolution following adequate medical therapy. If
there is not complete resolution, then recommend further evaluation
with CT of the chest to exclude underlying pathology.

## 2016-07-10 DIAGNOSIS — Z23 Encounter for immunization: Secondary | ICD-10-CM | POA: Diagnosis not present

## 2016-08-07 ENCOUNTER — Inpatient Hospital Stay (HOSPITAL_COMMUNITY): Admission: RE | Admit: 2016-08-07 | Payer: Medicare Other | Source: Ambulatory Visit

## 2016-08-15 ENCOUNTER — Other Ambulatory Visit (HOSPITAL_COMMUNITY): Payer: Self-pay | Admitting: *Deleted

## 2016-08-16 ENCOUNTER — Inpatient Hospital Stay (HOSPITAL_COMMUNITY): Admission: RE | Admit: 2016-08-16 | Payer: Medicare Other | Source: Ambulatory Visit

## 2016-08-23 ENCOUNTER — Encounter (HOSPITAL_COMMUNITY)
Admission: RE | Admit: 2016-08-23 | Discharge: 2016-08-23 | Disposition: A | Discharge status: Home / Self Care | Payer: Medicare Other | Source: Ambulatory Visit | Attending: Internal Medicine | Admitting: Internal Medicine

## 2016-08-23 LAB — POCT HEMOGLOBIN-HEMACUE: Hemoglobin: 15.6 g/dL (ref 13.0–17.0)

## 2016-08-23 MED ORDER — LIDOCAINE HCL 2 % IJ SOLN
0.1000 mL | Freq: Once | INTRAMUSCULAR | Status: AC
  Administered 2016-08-23: 2 mg via INTRADERMAL

## 2016-08-23 MED ORDER — LIDOCAINE HCL (PF) 2 % IJ SOLN
INTRAMUSCULAR | Status: AC
  Filled 2016-08-23: qty 10

## 2016-08-23 NOTE — Progress Notes (Signed)
Pt came in today for scheduled therapeutic phlebotomy.  His HemoCue prior to procedure was 15.8  His right Ac was used.  500 cc was removed.  Lidocaine was used prior to procedure.  Pt tolerated well.

## 2016-10-10 ENCOUNTER — Other Ambulatory Visit (HOSPITAL_COMMUNITY): Payer: Self-pay | Admitting: *Deleted

## 2016-10-11 ENCOUNTER — Encounter (HOSPITAL_COMMUNITY)
Admission: RE | Admit: 2016-10-11 | Discharge: 2016-10-11 | Disposition: A | Discharge status: Home / Self Care | Payer: Medicare Other | Source: Ambulatory Visit | Attending: Internal Medicine | Admitting: Internal Medicine

## 2016-10-11 MED ORDER — LIDOCAINE HCL 2 % IJ SOLN
0.1000 mL | Freq: Once | INTRAMUSCULAR | Status: DC
  Administered 2016-10-11: 0.1 mL via INTRADERMAL

## 2016-10-11 MED ORDER — LIDOCAINE HCL (PF) 2 % IJ SOLN
INTRAMUSCULAR | Status: AC
  Administered 2016-10-11: 0.1 mL via INTRADERMAL
  Filled 2016-10-11: qty 10

## 2016-10-11 NOTE — Progress Notes (Signed)
PT came in today for scheduled therapeutic phlebotomy.  Pts HemoCue prior to procedure was 16.3.  Lidocaine was used prior to procedure.  Right AC was used 500 cc of blood was removed.  PT tolerated procedure well

## 2016-10-12 LAB — POCT HEMOGLOBIN-HEMACUE: Hemoglobin: 16.3 g/dL (ref 13.0–17.0)

## 2016-12-12 ENCOUNTER — Other Ambulatory Visit (HOSPITAL_COMMUNITY): Payer: Self-pay | Admitting: *Deleted

## 2016-12-13 ENCOUNTER — Encounter (HOSPITAL_COMMUNITY)
Admission: RE | Admit: 2016-12-13 | Discharge: 2016-12-13 | Disposition: A | Discharge status: Home / Self Care | Payer: Medicare Other | Source: Ambulatory Visit | Attending: Internal Medicine | Admitting: Internal Medicine

## 2016-12-13 LAB — POCT HEMOGLOBIN-HEMACUE: Hemoglobin: 15.6 g/dL (ref 13.0–17.0)

## 2016-12-13 MED ORDER — LIDOCAINE HCL 2 % IJ SOLN
0.1000 mL | Freq: Once | INTRAMUSCULAR | Status: DC
Start: 2016-12-13 — End: 2016-12-14

## 2016-12-13 MED ORDER — LIDOCAINE HCL (PF) 1 % IJ SOLN
INTRAMUSCULAR | Status: AC
  Administered 2016-12-13: 2 mL
  Filled 2016-12-13: qty 2

## 2016-12-13 NOTE — Progress Notes (Signed)
hemocue 15.6 today.  Used lidocaine to right AC per MD order and pt request.  Phlebotomized 500cc and pt tolerated procedure well.

## 2016-12-25 DIAGNOSIS — R21 Rash and other nonspecific skin eruption: Secondary | ICD-10-CM | POA: Diagnosis not present

## 2016-12-25 DIAGNOSIS — R040 Epistaxis: Secondary | ICD-10-CM | POA: Diagnosis not present

## 2017-01-01 DIAGNOSIS — I10 Essential (primary) hypertension: Secondary | ICD-10-CM | POA: Diagnosis not present

## 2017-01-01 DIAGNOSIS — R040 Epistaxis: Secondary | ICD-10-CM | POA: Diagnosis not present

## 2017-01-02 DIAGNOSIS — R040 Epistaxis: Secondary | ICD-10-CM | POA: Diagnosis not present

## 2017-02-28 DIAGNOSIS — Z125 Encounter for screening for malignant neoplasm of prostate: Secondary | ICD-10-CM | POA: Diagnosis not present

## 2017-02-28 DIAGNOSIS — Z0000 Encounter for general adult medical examination without abnormal findings: Secondary | ICD-10-CM | POA: Diagnosis not present

## 2017-02-28 DIAGNOSIS — Z1211 Encounter for screening for malignant neoplasm of colon: Secondary | ICD-10-CM | POA: Diagnosis not present

## 2017-02-28 DIAGNOSIS — E7800 Pure hypercholesterolemia, unspecified: Secondary | ICD-10-CM | POA: Diagnosis not present

## 2017-02-28 DIAGNOSIS — I10 Essential (primary) hypertension: Secondary | ICD-10-CM | POA: Diagnosis not present

## 2017-02-28 DIAGNOSIS — Z6830 Body mass index (BMI) 30.0-30.9, adult: Secondary | ICD-10-CM | POA: Diagnosis not present

## 2017-02-28 DIAGNOSIS — Z86010 Personal history of colonic polyps: Secondary | ICD-10-CM | POA: Diagnosis not present

## 2017-02-28 DIAGNOSIS — N400 Enlarged prostate without lower urinary tract symptoms: Secondary | ICD-10-CM | POA: Diagnosis not present

## 2017-03-25 DIAGNOSIS — D229 Melanocytic nevi, unspecified: Secondary | ICD-10-CM | POA: Diagnosis not present

## 2017-03-25 DIAGNOSIS — L723 Sebaceous cyst: Secondary | ICD-10-CM | POA: Diagnosis not present

## 2017-04-06 DIAGNOSIS — I499 Cardiac arrhythmia, unspecified: Secondary | ICD-10-CM | POA: Diagnosis not present

## 2017-04-06 DIAGNOSIS — S7001XA Contusion of right hip, initial encounter: Secondary | ICD-10-CM | POA: Diagnosis not present

## 2017-04-06 DIAGNOSIS — S51021A Laceration with foreign body of right elbow, initial encounter: Secondary | ICD-10-CM | POA: Diagnosis not present

## 2017-04-06 DIAGNOSIS — Z87891 Personal history of nicotine dependence: Secondary | ICD-10-CM | POA: Diagnosis not present

## 2017-04-06 DIAGNOSIS — I440 Atrioventricular block, first degree: Secondary | ICD-10-CM | POA: Diagnosis not present

## 2017-04-06 DIAGNOSIS — R000 Tachycardia, unspecified: Secondary | ICD-10-CM | POA: Diagnosis not present

## 2017-04-06 DIAGNOSIS — R937 Abnormal findings on diagnostic imaging of other parts of musculoskeletal system: Secondary | ICD-10-CM | POA: Diagnosis not present

## 2017-04-06 DIAGNOSIS — I498 Other specified cardiac arrhythmias: Secondary | ICD-10-CM | POA: Diagnosis not present

## 2017-04-06 DIAGNOSIS — R9431 Abnormal electrocardiogram [ECG] [EKG]: Secondary | ICD-10-CM | POA: Diagnosis not present

## 2017-04-06 DIAGNOSIS — E7800 Pure hypercholesterolemia, unspecified: Secondary | ICD-10-CM | POA: Diagnosis not present

## 2017-04-06 DIAGNOSIS — S7011XA Contusion of right thigh, initial encounter: Secondary | ICD-10-CM | POA: Diagnosis not present

## 2017-04-06 DIAGNOSIS — S79911A Unspecified injury of right hip, initial encounter: Secondary | ICD-10-CM | POA: Diagnosis not present

## 2017-04-06 DIAGNOSIS — M795 Residual foreign body in soft tissue: Secondary | ICD-10-CM | POA: Diagnosis not present

## 2017-04-06 DIAGNOSIS — W1830XA Fall on same level, unspecified, initial encounter: Secondary | ICD-10-CM | POA: Diagnosis not present

## 2017-04-06 DIAGNOSIS — S51011A Laceration without foreign body of right elbow, initial encounter: Secondary | ICD-10-CM | POA: Diagnosis not present

## 2017-04-08 DIAGNOSIS — T8189XA Other complications of procedures, not elsewhere classified, initial encounter: Secondary | ICD-10-CM | POA: Diagnosis not present

## 2017-04-08 DIAGNOSIS — L98491 Non-pressure chronic ulcer of skin of other sites limited to breakdown of skin: Secondary | ICD-10-CM | POA: Diagnosis not present

## 2017-04-11 ENCOUNTER — Encounter (HOSPITAL_COMMUNITY): Payer: Self-pay

## 2017-04-11 ENCOUNTER — Encounter (HOSPITAL_COMMUNITY): Payer: Medicare Other

## 2017-04-11 ENCOUNTER — Emergency Department (HOSPITAL_COMMUNITY)
Admission: EM | Admit: 2017-04-11 | Discharge: 2017-04-11 | Discharge status: Against Medical Advice | Payer: Medicare Other | Source: Home / Self Care

## 2017-04-11 DIAGNOSIS — S99911A Unspecified injury of right ankle, initial encounter: Secondary | ICD-10-CM | POA: Diagnosis not present

## 2017-04-11 DIAGNOSIS — S7011XD Contusion of right thigh, subsequent encounter: Secondary | ICD-10-CM | POA: Diagnosis not present

## 2017-04-11 DIAGNOSIS — M7989 Other specified soft tissue disorders: Secondary | ICD-10-CM | POA: Diagnosis not present

## 2017-04-11 DIAGNOSIS — I10 Essential (primary) hypertension: Secondary | ICD-10-CM | POA: Insufficient documentation

## 2017-04-11 DIAGNOSIS — R600 Localized edema: Secondary | ICD-10-CM | POA: Diagnosis not present

## 2017-04-11 DIAGNOSIS — R2242 Localized swelling, mass and lump, left lower limb: Secondary | ICD-10-CM | POA: Diagnosis not present

## 2017-04-11 DIAGNOSIS — R2241 Localized swelling, mass and lump, right lower limb: Secondary | ICD-10-CM | POA: Insufficient documentation

## 2017-04-11 DIAGNOSIS — S9031XD Contusion of right foot, subsequent encounter: Secondary | ICD-10-CM | POA: Insufficient documentation

## 2017-04-11 DIAGNOSIS — Z7982 Long term (current) use of aspirin: Secondary | ICD-10-CM | POA: Diagnosis not present

## 2017-04-11 DIAGNOSIS — Z87891 Personal history of nicotine dependence: Secondary | ICD-10-CM | POA: Insufficient documentation

## 2017-04-11 DIAGNOSIS — Z79899 Other long term (current) drug therapy: Secondary | ICD-10-CM | POA: Insufficient documentation

## 2017-04-11 DIAGNOSIS — S99921A Unspecified injury of right foot, initial encounter: Secondary | ICD-10-CM | POA: Diagnosis not present

## 2017-04-11 DIAGNOSIS — S51811D Laceration without foreign body of right forearm, subsequent encounter: Secondary | ICD-10-CM | POA: Insufficient documentation

## 2017-04-11 DIAGNOSIS — W19XXXD Unspecified fall, subsequent encounter: Secondary | ICD-10-CM | POA: Diagnosis not present

## 2017-04-11 NOTE — ED Notes (Signed)
No answer in the waiting room

## 2017-04-12 ENCOUNTER — Encounter (HOSPITAL_COMMUNITY): Payer: Self-pay

## 2017-04-12 ENCOUNTER — Emergency Department (HOSPITAL_COMMUNITY)
Admission: EM | Admit: 2017-04-12 | Discharge: 2017-04-12 | Disposition: A | Discharge status: Home / Self Care | Payer: Medicare Other | Attending: Emergency Medicine | Admitting: Emergency Medicine

## 2017-04-12 ENCOUNTER — Emergency Department (HOSPITAL_COMMUNITY): Payer: Medicare Other

## 2017-04-12 ENCOUNTER — Ambulatory Visit (HOSPITAL_BASED_OUTPATIENT_CLINIC_OR_DEPARTMENT_OTHER)
Admission: RE | Admit: 2017-04-12 | Discharge: 2017-04-12 | Disposition: A | Discharge status: Home / Self Care | Payer: Medicare Other | Source: Ambulatory Visit | Attending: Student | Admitting: Student

## 2017-04-12 DIAGNOSIS — W19XXXA Unspecified fall, initial encounter: Secondary | ICD-10-CM

## 2017-04-12 DIAGNOSIS — S99911A Unspecified injury of right ankle, initial encounter: Secondary | ICD-10-CM | POA: Diagnosis not present

## 2017-04-12 DIAGNOSIS — M7989 Other specified soft tissue disorders: Secondary | ICD-10-CM | POA: Diagnosis not present

## 2017-04-12 DIAGNOSIS — R2241 Localized swelling, mass and lump, right lower limb: Secondary | ICD-10-CM | POA: Diagnosis not present

## 2017-04-12 DIAGNOSIS — S99921A Unspecified injury of right foot, initial encounter: Secondary | ICD-10-CM | POA: Diagnosis not present

## 2017-04-12 DIAGNOSIS — R600 Localized edema: Secondary | ICD-10-CM | POA: Diagnosis not present

## 2017-04-12 DIAGNOSIS — S56921D Laceration of unspecified muscles, fascia and tendons at forearm level, right arm, subsequent encounter: Secondary | ICD-10-CM | POA: Diagnosis not present

## 2017-04-12 DIAGNOSIS — S51801D Unspecified open wound of right forearm, subsequent encounter: Secondary | ICD-10-CM | POA: Diagnosis not present

## 2017-04-12 MED ORDER — DOXYCYCLINE HYCLATE 100 MG PO CAPS: 100.0000 mg | ORAL_CAPSULE | Freq: Two times a day (BID) | ORAL | 0 refills | Status: DC

## 2017-04-12 MED ORDER — BACITRACIN ZINC 500 UNIT/GM EX OINT
TOPICAL_OINTMENT | Freq: Two times a day (BID) | CUTANEOUS | Status: DC
  Administered 2017-04-12: 06:00:00 via TOPICAL
  Filled 2017-04-12: qty 1.8

## 2017-04-12 NOTE — Discharge Instructions (Signed)
Please make sure you take the new antibiotics for 7 days. Keep the wound clean and dry. Your xray showed no signs of fracture. Make sure you see your pcp today and follow up with your [place] this am.     IMPORTANT PATIENT INSTRUCTIONS:  You have been scheduled for an Outpatient Vascular Study at [facility].    If tomorrow is a Saturday, Sunday or holiday, please go to the [facility] Emergency Department Registration Desk at 8 am tomorrow morning and tell them you are there for a vascular study.  If tomorrow is a weekday (Monday-Friday), please go to [facility] at 8 am and tell them  you are  there for a vascular study.

## 2017-04-12 NOTE — ED Provider Notes (Signed)
[facility] DEPT Provider Note   CSN: [id number] Arrival date & time: 04/11/17  2335     History   Chief Complaint Chief Complaint  Patient presents with  . Leg Swelling    HPI Drew Calderon is a 76 y.o. male.  HPI 76 year old Caucasian male past medical history significant for hypertension presents to the emergency Department today with complaints of right ankle swelling. The patient states that he recently returned from [place] one day ago. The patient states that he flew on an air pain. The patient states that 3 days ago patient was in [place] and tripped and fell causing a laceration to his right forearm and bruising to his right lateral thigh. The patient was seen at [facility] in [place] where he was seen by ortho who underwent surgical debridement and laceration repair. Patient has 60 stitches to his right lateral forearm. The patient was started on Keflex. Denies any pain with associated with the arm. The patient states that since being back from [place] he has noticed swelling in his right ankle. Denies any associated pain. Patient has been ambulatory. Denies any associated shortness of breath or chest pain. Patient concern for possible blood clot and would like ultrasound to evaluate for this.he patient also states he has follow-up with his PCP at 8 o'clock this morning to get referral to wound clinic for his wound to his right lateral forearm.patient reports ecchymosis to his right lateral thigh. Denies any associated pain. Patient denies hitting his head during this fall. States that he did have imaging that showed no signs of fracture.   Pt denies any fever, chill, ha, vision changes, lightheadedness, dizziness, congestion, neck pain, cp, sob, cough, abd pain, n/v/d, urinary symptoms, change in bowel habits, melena, hematochezia, lower extremity paresthesias.   Past Medical History:  Diagnosis Date  . Hypertension     There are no active problems to display for this  patient.   History reviewed. No pertinent surgical history.     Home Medications    Prior to Admission medications   Medication Sig Start Date End Date Taking? Authorizing Provider  amLODipine (NORVASC) 5 MG tablet Take 5 mg by mouth daily. 05/20/15  Yes [provider]  aspirin EC 81 MG tablet Take 81 mg by mouth daily.   Yes [provider]  atorvastatin (LIPITOR) 10 MG tablet Take 10 mg by mouth daily. 05/20/15  Yes [provider]  cephALEXin (KEFLEX) 500 MG capsule Take 1 capsule by mouth 4 (four) times daily. 04/08/17 04/19/17 Yes [provider]  ECHINACEA PO Take 1 tablet by mouth daily.   Yes [provider]  OVER THE COUNTER MEDICATION Take 1 tablet by mouth 2 (two) times daily. Antioxidant multi vitamin   Yes [provider]  cephALEXin (KEFLEX) 500 MG capsule Take 1 capsule (500 mg total) by mouth 4 (four) times daily. Patient not taking: Reported on 04/12/2017 06/26/15   Drew Leigh, MD  doxycycline (VIBRAMYCIN) 100 MG capsule Take 1 capsule (100 mg total) by mouth 2 (two) times daily. 04/12/17   [name], PA-C  HYDROcodone-acetaminophen (NORCO/VICODIN) 5-325 MG tablet Take 1-2 tablets by mouth every 4 (four) hours as needed. Patient not taking: Reported on 04/12/2017 06/26/15   Drew Leigh, MD    Family History History reviewed. No pertinent family history.  Social History Social History  Substance Use Topics  . Smoking status: Former Research scientist (life sciences)  . Smokeless tobacco: Never Used  . Alcohol use 1.2 oz/week    2  Glasses of wine per week     Comment: 2 glasses wine daily      Allergies   Patient has no known allergies.   Review of Systems Review of Systems  Constitutional: Negative for chills and fever.  HENT: Negative for congestion and sore throat.   Eyes: Negative for visual disturbance.  Respiratory: Negative for cough and shortness of breath.   Cardiovascular: Positive for leg swelling.  Negative for chest pain and palpitations.  Gastrointestinal: Negative for abdominal pain, diarrhea, nausea and vomiting.  Genitourinary: Negative for dysuria, flank pain, frequency, hematuria, scrotal swelling, testicular pain and urgency.  Musculoskeletal: Negative for arthralgias and myalgias.  Skin: Positive for color change and wound. Negative for rash.  Neurological: Negative for dizziness, syncope, weakness, light-headedness, numbness and headaches.  Psychiatric/Behavioral: Negative for sleep disturbance. The patient is not nervous/anxious.      Physical Exam Updated Vital Signs BP (!) 148/97 (BP Location: Left Arm)   Pulse 74   Temp 98.4 F (36.9 C) (Oral)   Resp 18   SpO2 95%   Physical Exam  Constitutional: He is oriented to person, place, and time. He appears well-developed and well-nourished.  Non-toxic appearance. No distress.  HENT:  Head: Normocephalic and atraumatic.  Mouth/Throat: Oropharynx is clear and moist.  Eyes: Pupils are equal, round, and reactive to light. Conjunctivae are normal. Right eye exhibits no discharge. Left eye exhibits no discharge.  Neck: Normal range of motion. Neck supple.  Cardiovascular: Normal rate, regular rhythm, normal heart sounds and intact distal pulses.  Exam reveals no gallop and no friction rub.   No murmur heard. Pulmonary/Chest: Effort normal and breath sounds normal. No respiratory distress. He has no wheezes. He has no rales. He exhibits no tenderness.  Abdominal: Soft. Bowel sounds are normal. He exhibits no distension. There is no tenderness. There is no rebound and no guarding.  Musculoskeletal: Normal range of motion. He exhibits edema. He exhibits no tenderness or deformity.  The patient does have nonpitting edema to his right ankle that is nontender. No ecchymosis. Does have some bruising between the first and second metatarsal joint without tenderness to palpation. Full range of motion of the right foot and right ankle  without pain. Cap refill is normal. DP pulses are 2+ bilaterally. Sensation intact in all dermatomes. PATIENT WITH LARGE ECCHYMOSIS TO THE RIGHT LATERAL THIGH AND HIP.COMPARTMENTS ARE SOFT.  Lymphadenopathy:    He has no cervical adenopathy.  Neurological: He is alert and oriented to person, place, and time.  Skin: Skin is warm and dry. Capillary refill takes less than 2 seconds. No rash noted.  Patient does have healing wound to the right lateral forearm with stitches in place. Mild purulent drainage is noted. Granulated tissue noted. Full range of motion of the right elbow. Radial pulses 2+ bilaterally.  Psychiatric: His behavior is normal. Judgment and thought content normal.  Nursing note and vitals reviewed.          ED Treatments / Results  Labs (all labs ordered are listed, but only abnormal results are displayed) Labs Reviewed - No data to display  EKG  EKG Interpretation None       Radiology Dg Ankle Complete Right  Result Date: 04/12/2017 CLINICAL DATA:  Right foot ankle pain and swelling. Fall 4 days prior. EXAM: RIGHT ANKLE - COMPLETE 3+ VIEW COMPARISON:  None. FINDINGS: There is no evidence of acute fracture, dislocation, or joint effusion. Well corticated osseous density adjacent to the medial malleolus may be  sequela of remote prior injury or an accessory ossicle. Diffuse soft tissue edema. IMPRESSION: Soft tissue edema without acute osseous abnormality. No acute fracture. Electronically Signed   By: Jeb Levering M.D.   On: 04/12/2017 05:08   Dg Foot Complete Right  Result Date: 04/12/2017 CLINICAL DATA:  Right foot ankle pain and swelling. Fall 4 days prior. EXAM: RIGHT FOOT COMPLETE - 3+ VIEW COMPARISON:  None. FINDINGS: There is no evidence of fracture or dislocation. 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 Prominent plantar calcaneal spur and small Achilles tendon enthesophyte. Incidental os peroneal. Dorsal soft tissue edema. IMPRESSION: Soft tissue  edema without acute osseous abnormality. Scattered osteoarthritis. Electronically Signed   By: Jeb Levering M.D.   On: 04/12/2017 05:07    Procedures Procedures (including critical care time)  Medications Ordered in ED Medications  bacitracin ointment ( Topical Given 04/12/17 0540)     Initial Impression / Assessment and Plan / ED Course  I have reviewed the triage vital signs and the nursing notes.  Pertinent labs & imaging results that were available during my care of the patient were reviewed by me and considered in my medical decision making (see chart for details).     Patient presents to the ED with complaints of swelling to his right ankle. Recent travel from the [place] with surgical for orthopedic debridement after fall. Patient denies any associated pain to the right ankle, chest pain, shortness of breath. Patient does have ecchymosis to the right lateral thigh with soft compartments. Wound to the right lateral forearm with stitches in place and mild purulent drainage. Patient is neurovascularly intact in all extremity is.  Patient is overall well-appearing and nontoxic. Vital signs are reassuring. No hypoxia, no tachypnea, no tachycardia. Patient is afebrile.  Patient's wound was redressed and patient was started on doxycycline to cover for infection. Patient does have some lower extremity edema on exam however he does have ecchymosis to his right lateral thigh. Recent fall that required surgical intervention.  Unable to obtain lower extremity Doppler at this time. Patient will need outpatient  Ultrasound at [facility] in the a.m. Cautious to provide patient with Lovenox given recent fall and significant ecchymosis to the right lateral thigh concern for increasing component syndrome the patient about a Lovenox.  The patient does have some bruising noted to the first and second metatarsal joint of the right foot. We'll obtain x-rays to rule out any occult fracture that could  be causing this patient edema. X-ray showed no occult fracture. Patient is ambulatory. Neurovascular intact in all extremities. Patient denies any associated symptoms of chest pain or shortness of breath. Given reassuring vital signs doubt PE at this time.  Shared decision-making with patient concerning Lovenox administration for patient. Discussed risks versus benefits of Lovenox injections given recent fall and significant ecchymosis of the thigh vs risk of dvt/pe. Patient would like to defer the Lovenox injection at this time. Will see his primary care doctor for an appointment this morning. We'll also have outpatient ultrasound this a.m. Patient discussed strict return precautions.  Pt is hemodynamically stable, in NAD, & able to ambulate in the ED. Evaluation does not show pathology that would require ongoing emergent intervention or inpatient treatment. I explained the diagnosis to the patient. Pain has been managed & has no complaints prior to dc. Pt is comfortable with above plan and is stable for discharge at this time. All questions were answered prior to disposition. Strict return precautions for f/u to the  ED were discussed. Encouraged follow up with PCP.   Pt seen and eval by Dr. Randal Buba who is agreeable to the above plan.   Final Clinical Impressions(s) / ED Diagnoses   Final diagnoses:  Fall    New Prescriptions Discharge Medication List as of 04/12/2017  5:37 AM    START taking these medications   Details  doxycycline (VIBRAMYCIN) 100 MG capsule Take 1 capsule (100 mg total) by mouth 2 (two) times daily., Starting Fri 04/12/2017, Print         [name], PA-C 04/12/17 1245    Randal Buba, April, MD 04/12/17 8099

## 2017-04-12 NOTE — ED Notes (Signed)
Right arm wound dressed with Telfa and Bacitracin. Pt tolerated well. Site supported with ACE wrap.

## 2017-04-12 NOTE — ED Triage Notes (Signed)
Pt complains of right foot swelling for 4 hours Pt fell in [place] 4 days ago and had emergency surgery on his right arm and has 60 stitches Pt flew home yesterday from [place], about 4 hours ago he noticed his right foot was much larger than his left foot Pt denies any pain

## 2017-04-12 NOTE — ED Notes (Signed)
Patient transported to X-ray 

## 2017-04-12 NOTE — Progress Notes (Signed)
**  Preliminary report by tech**  Right lower extremity venous duplex complete. There is no evidence of deep or superficial vein thrombosis involving the right lower extremity. All visualized vessels appear patent and compressible. There is no evidence of a Baker's cyst on the right.  04/12/17 10:30 AM Drew Calderon RVT

## 2017-04-15 ENCOUNTER — Encounter: Payer: Medicare Other | Attending: Surgery | Admitting: Surgery

## 2017-04-15 DIAGNOSIS — X58XXXA Exposure to other specified factors, initial encounter: Secondary | ICD-10-CM | POA: Diagnosis not present

## 2017-04-15 DIAGNOSIS — Z79899 Other long term (current) drug therapy: Secondary | ICD-10-CM | POA: Insufficient documentation

## 2017-04-15 DIAGNOSIS — I10 Essential (primary) hypertension: Secondary | ICD-10-CM | POA: Insufficient documentation

## 2017-04-15 DIAGNOSIS — Z7982 Long term (current) use of aspirin: Secondary | ICD-10-CM | POA: Insufficient documentation

## 2017-04-15 DIAGNOSIS — S51801A Unspecified open wound of right forearm, initial encounter: Secondary | ICD-10-CM | POA: Insufficient documentation

## 2017-04-15 DIAGNOSIS — Z87891 Personal history of nicotine dependence: Secondary | ICD-10-CM | POA: Diagnosis not present

## 2017-04-15 DIAGNOSIS — S51811A Laceration without foreign body of right forearm, initial encounter: Secondary | ICD-10-CM | POA: Diagnosis not present

## 2017-04-15 NOTE — Progress Notes (Signed)
MARCELUS, DUBBERLY ([id number]) Visit Report for 04/15/2017 Abuse/Suicide Risk Screen Details Patient Name: Drew Calderon, Drew Calderon 04/15/2017 1:00 Date of Service: PM Medical Record [id number] Number: Patient Account Number: [id number] Date of Birth/Sex: [birth year] (76 y.o. Male) Treating RN: Deliah Boston, Other Clinician: Primary Care Mallori Araque: RUPASHREE Treating Britto, Bo Mcclintock, Saniyyah Elster/Extender: Referring Shannia Jacuinde: Jake Samples in Treatment: 0 Abuse/Suicide Risk Screen Items Answer ABUSE/SUICIDE RISK SCREEN: Has anyone close to you tried to hurt or harm you recentlyo No Do you feel uncomfortable with anyone in your familyo No Has anyone forced you do things that you didnot want to doo No Do you have any thoughts of harming yourselfo No Patient displays signs or symptoms of abuse and/or neglect. No Electronic Signature(s) Signed: 04/15/2017 3:22:11 PM By: Gretta Cool, BSN, RN, CWS, Kim RN, BSN Entered By: Gretta Cool, BSN, RN, CWS, Kim on 04/15/2017 13:27:25 Drew Calderon ([id number]) -------------------------------------------------------------------------------- Activities of Daily Living Details Patient Name: Drew, Calderon 04/15/2017 1:00 Date of Service: PM Medical Record [id number] Number: Patient Account Number: [id number] Date of Birth/Sex: 07/02/1941 (76 y.o. Male) Treating RN: Deliah Boston, Other Clinician: Primary Care Margit Batte: RUPASHREE Treating Britto, Bo Mcclintock, Reise Gladney/Extender: Referring Dartagnan Beavers: Jake Samples in Treatment: 0 Activities of Daily Living Items Answer Activities of Daily Living (Please select one for each item) Drive Automobile Completely Able Take Medications Completely Able Use Telephone Completely Able Care for Appearance Completely Able Use Toilet Completely Able Bath / Shower Completely Able Dress Self Completely Able Feed Self Completely Able Walk Completely Able Get In / Out Bed  Completely Able Housework Completely Able Prepare Meals Completely [place] for Self Completely Able Electronic Signature(s) Signed: 04/15/2017 3:22:11 PM By: Gretta Cool, BSN, RN, CWS, Kim RN, BSN Entered By: Gretta Cool, BSN, RN, CWS, Kim on 04/15/2017 13:27:31 Drew Calderon ([id number]) -------------------------------------------------------------------------------- Education Assessment Details Patient Name: Drew Calderon 04/15/2017 1:00 Date of Service: PM Medical Record [id number] Number: Patient Account Number: [id number] Date of Birth/Sex: [birth year] (76 y.o. Male) Treating RN: Deliah Boston, Other Clinician: Primary Care Ottie Neglia: RUPASHREE Treating Britto, Bo Mcclintock, Reona Zendejas/Extender: Referring Login Muckleroy: Jake Samples in Treatment: 0 Primary Learner Assessed: Patient Learning Preferences/Education Level/Primary Language Learning Preference: Explanation Highest Education Level: College or Above Preferred Language: English Cognitive Barrier Assessment/Beliefs Language Barrier: No Translator Needed: No Memory Deficit: No Emotional Barrier: No Cultural/Religious Beliefs Affecting Medical No Care: Physical Barrier Assessment Impaired Vision: No Impaired Hearing: No Decreased Hand dexterity: No Knowledge/Comprehension Assessment Knowledge Level: High Comprehension Level: High Ability to understand written High instructions: Ability to understand verbal High instructions: Motivation Assessment Anxiety Level: Calm Cooperation: Cooperative Education Importance: Acknowledges Need Interest in Health Problems: Asks Questions Perception: Coherent Willingness to Engage in Self- High Management Activities: High SHRIHAN, PUTT ([id number]) Readiness to Engage in Self- Management Activities: Electronic Signature(s) Signed: 04/15/2017 3:22:11 PM By: Gretta Cool, BSN, RN, CWS, Kim RN, BSN Entered By: Gretta Cool, BSN,  RN, CWS, Kim on 04/15/2017 13:27:36 Drew Calderon ([id number]) -------------------------------------------------------------------------------- Fall Risk Assessment Details Patient Name: Drew Calderon. 04/15/2017 1:00 Date of Service: PM Medical Record [id number] Number: Patient Account Number: [id number] Date of Birth/Sex: [birth year] (76 y.o. Male) Treating RN: Deliah Boston, Other Clinician: Primary Care Jasamine Pottinger: RUPASHREE Treating Britto, Bo Mcclintock, Rielly Brunn/Extender: Referring Nozomi Mettler: Jake Samples in Treatment: 0 Fall Risk Assessment Items Have you had 2 or more falls in the last 12 monthso 0 No Have you had any fall that resulted in injury in the last 12 monthso 0 Yes FALL RISK ASSESSMENT: History of  falling - immediate or within 3 months 25 Yes Secondary diagnosis 0 No Ambulatory aid None/bed rest/wheelchair/nurse 0 Yes Crutches/cane/walker 0 No Furniture 0 No IV Access/Saline Lock 0 No Gait/Training Normal/bed rest/immobile 0 Yes Weak 0 No Impaired 0 No Mental Status Oriented to own ability 0 Yes Electronic Signature(s) Signed: 04/15/2017 3:22:11 PM By: Gretta Cool, BSN, RN, CWS, Kim RN, BSN Entered By: Gretta Cool, BSN, RN, CWS, Kim on 04/15/2017 13:27:41 Drew Calderon ([id number]) -------------------------------------------------------------------------------- Nutrition Risk Assessment Details Patient Name: Drew, Calderon 04/15/2017 1:00 Date of Service: PM Medical Record [id number] Number: Patient Account Number: [id number] Date of Birth/Sex: [birth year] (76 y.o. Male) Treating RN: Deliah Boston, Other Clinician: Primary Care Chrisy Hillebrand: RUPASHREE Treating Britto, Bo Mcclintock, Felita Bump/Extender: Referring Hatley Henegar: Jake Samples in Treatment: 0 Height (in): 72 Weight (lbs): 222 Body Mass Index (BMI): 30.1 Nutrition Risk Assessment Items NUTRITION RISK SCREEN: I have an illness or condition that made me  change the kind and/or 0 No amount of food I eat I eat fewer than two meals per day 0 No I eat few fruits and vegetables, or milk products 0 No I have three or more drinks of beer, liquor or wine almost every day 0 No I have tooth or mouth problems that make it hard for me to eat 0 No I don't always have enough money to buy the food I need 0 No I eat alone most of the time 0 No I take three or more different prescribed or over-the-counter drugs a 0 No day Without wanting to, I have lost or gained 10 pounds in the last six 0 No months I am not always physically able to shop, cook and/or feed myself 0 No Nutrition Protocols Good Risk Protocol 0 No interventions needed Moderate Risk Protocol Electronic Signature(s) Signed: 04/15/2017 3:22:11 PM By: Gretta Cool, BSN, RN, CWS, Kim RN, BSN Entered By: Gretta Cool, BSN, RN, CWS, Kim on 04/15/2017 13:27:46

## 2017-04-16 NOTE — Progress Notes (Addendum)
KELLER, BOUNDS ([id number]) Visit Report for 04/15/2017 Allergy List Details Patient Name: Drew Calderon, Drew Calderon 04/15/2017 1:00 Date of Service: PM Medical Record [id number] Number: Patient Account Number: [id number] Date of Birth/Sex: [birth year] (76 y.o. Male) Treating RN: Deliah Boston, Other Clinician: Primary Care Romin Divita: RUPASHREE Treating Britto, Bo Mcclintock, Samaira Holzworth/Extender: Referring Lee-Ann Gal: Jake Samples in Treatment: 0 Allergies Active Allergies No Known Allergies Allergy Notes Electronic Signature(s) Signed: 04/15/2017 3:22:11 PM By: Gretta Cool, BSN, RN, CWS, Kim RN, BSN Entered By: Gretta Cool, BSN, RN, CWS, Kim on 04/15/2017 13:26:57 Drew Calderon Drew Calderon ([id number]) -------------------------------------------------------------------------------- Arrival Information Details Patient Name: Drew Calderon, Drew Calderon 04/15/2017 1:00 Date of Service: PM Medical Record [id number] Number: Patient Account Number: [id number] Date of Birth/Sex: 30-Jan-1941 (76 y.o. Male) Treating RN: Deliah Boston, Other Clinician: Primary Care Penelopi Mikrut: RUPASHREE Treating Britto, Bo Mcclintock, Aleksa Catterton/Extender: Referring Calyx Hawker: Jake Samples in Treatment: 0 Visit Information Patient Arrived: Ambulatory Arrival Time: 13:16 Accompanied By: wife Transfer Assistance: None Patient Identification Verified: Yes Secondary Verification Process Yes Completed: Patient Requires Transmission- No Based Precautions: Patient Has Alerts: Yes Patient Alerts: Patient on Blood Thinner 81mg  aspirin Electronic Signature(s) Signed: 04/15/2017 3:22:11 PM By: Gretta Cool, BSN, RN, CWS, Kim RN, BSN Entered By: Gretta Cool, BSN, RN, CWS, Kim on 04/15/2017 13:26:11 Drew Calderon Drew Calderon ([id number]) -------------------------------------------------------------------------------- Clinic Level of Care Assessment Details Patient Name: Drew Calderon, Drew Calderon 04/15/2017 1:00 Date of  Service: PM Medical Record [id number] Number: Patient Account Number: [id number] Date of Birth/Sex: 30-Mar-1941 (76 y.o. Male) Treating RN: Deliah Boston, Other Clinician: Primary Care Yash Cacciola: RUPASHREE Treating Britto, Bo Mcclintock, Smiley Birr/Extender: Referring Zaedyn Covin: Jake Samples in Treatment: 0 Clinic Level of Care Assessment Items TOOL 2 Quantity Score []  - Use when only an EandM is performed on the INITIAL visit 0 ASSESSMENTS - Nursing Assessment / Reassessment X - General Physical Exam (combine w/ comprehensive assessment (listed just 1 20 below) when performed on new pt. evals) X - Comprehensive Assessment (HX, ROS, Risk Assessments, Wounds Hx, etc.) 1 25 ASSESSMENTS - Wound and Skin Assessment / Reassessment X - Simple Wound Assessment / Reassessment - one wound 1 5 []  - Complex Wound Assessment / Reassessment - multiple wounds 0 []  - Dermatologic / Skin Assessment (not related to wound area) 0 ASSESSMENTS - Ostomy and/or Continence Assessment and Care []  - Incontinence Assessment and Management 0 []  - Ostomy Care Assessment and Management (repouching, etc.) 0 PROCESS - Coordination of Care X - Simple Patient / Family Education for ongoing care 1 15 []  - Complex (extensive) Patient / Family Education for ongoing care 0 X - Staff obtains Programmer, systems, Records, Test Results / Process Orders 1 10 []  - Staff telephones HHA, Nursing Homes / Clarify orders / etc 0 []  - Routine Transfer to another Facility (non-emergent condition) 0 []  - Routine Hospital Admission (non-emergent condition) 0 X - New Admissions / Biomedical engineer / Ordering NPWT, Apligraf, etc. 1 15 MALICHI, PALARDY ([id number]) []  - Emergency Hospital Admission (emergent condition) 0 X - Simple Discharge Coordination 1 10 []  - Complex (extensive) Discharge Coordination 0 PROCESS - Special Needs []  - Pediatric / Minor Patient Management 0 []  - Isolation Patient Management 0 []  -  Hearing / Language / Visual special needs 0 []  - Assessment of Community assistance (transportation, D/C planning, etc.) 0 []  - Additional assistance / Altered mentation 0 []  - Support Surface(s) Assessment (bed, cushion, seat, etc.) 0 INTERVENTIONS - Wound Cleansing / Measurement X - Wound Imaging (photographs - any number of wounds) 1 5 []  - Wound Tracing (instead  of photographs) 0 X - Simple Wound Measurement - one wound 1 5 []  - Complex Wound Measurement - multiple wounds 0 []  - Simple Wound Cleansing - one wound 0 []  - Complex Wound Cleansing - multiple wounds 0 INTERVENTIONS - Wound Dressings []  - Small Wound Dressing one or multiple wounds 0 X - Medium Wound Dressing one or multiple wounds 1 15 []  - Large Wound Dressing one or multiple wounds 0 []  - Application of Medications - injection 0 INTERVENTIONS - Miscellaneous []  - External ear exam 0 []  - Specimen Collection (cultures, biopsies, blood, body fluids, etc.) 0 []  - Specimen(s) / Culture(s) sent or taken to Lab for analysis 0 []  - Patient Transfer (multiple staff / Harrel Lemon Lift / Similar devices) 0 Drew Calderon Drew Calderon ([id number]) []  - Simple Staple / Suture removal (25 or less) 0 []  - Complex Staple / Suture removal (26 or more) 0 []  - Hypo / Hyperglycemic Management (close monitor of Blood Glucose) 0 []  - Ankle / Brachial Index (ABI) - do not check if billed separately 0 Has the patient been seen at the hospital within the last three years: Yes Total Score: 125 Level Of Care: New/Established - Level 4 Electronic Signature(s) Signed: 04/15/2017 4:18:20 PM By: Gretta Cool, BSN, RN, CWS, Kim RN, BSN Entered By: Gretta Cool, BSN, RN, CWS, Kim on 04/15/2017 16:16:01 Drew Calderon Drew Calderon ([id number]) -------------------------------------------------------------------------------- Encounter Discharge Information Details Patient Name: Drew Calderon, Drew Calderon 04/15/2017 1:00 Date of Service: PM Medical Record [id number] Number: Patient  Account Number: [id number] Date of Birth/Sex: 12-22-1940 (76 y.o. Male) Treating RN: Deliah Boston, Other Clinician: Primary Care Waneda Klammer: RUPASHREE Treating Britto, Bo Mcclintock, Zakiah Beckerman/Extender: Referring Kathrene Sinopoli: Jake Samples in Treatment: 0 Encounter Discharge Information Items Discharge Pain Level: 0 Discharge Condition: Stable Ambulatory Status: Ambulatory Discharge Destination: Home Transportation: Private Auto Accompanied By: wife Schedule Follow-up Appointment: Yes Medication Reconciliation completed and provided to Patient/Care Yes Thamar Holik: Provided on Clinical Summary of Care: 04/16/2017 Form Type Recipient Paper Patient WS Electronic Signature(s) Signed: 04/16/2017 4:18:12 PM By: Ruthine Dose Previous Signature: 04/15/2017 4:18:20 PM Version By: Gretta Cool, BSN, RN, CWS, Kim RN, BSN Entered By: Ruthine Dose on 04/16/2017 09:02:05 Drew Calderon Drew Calderon ([id number]) -------------------------------------------------------------------------------- Lower Extremity Assessment Details Patient Name: Drew Calderon Drew Calderon. 04/15/2017 1:00 Date of Service: PM Medical Record [id number] Number: Patient Account Number: [id number] Date of Birth/Sex: [birth year] (76 y.o. Male) Treating RN: Deliah Boston, Other Clinician: Primary Care Joseangel Nettleton: RUPASHREE Treating Britto, Bo Mcclintock, Aiyana Stegmann/Extender: Referring Jaimen Melone: Jake Samples in Treatment: 0 Electronic Signature(s) Signed: 04/15/2017 3:22:11 PM By: Gretta Cool, BSN, RN, CWS, Kim RN, BSN Entered By: Gretta Cool, BSN, RN, CWS, Kim on 04/15/2017 13:33:33 Drew Calderon Drew Calderon ([id number]) -------------------------------------------------------------------------------- Multi Wound Chart Details Patient Name: Drew Calderon Drew Calderon. 04/15/2017 1:00 Date of Service: PM Medical Record [id number] Number: Patient Account Number: [id number] Date of Birth/Sex: [birth year] (76 y.o. Male) Treating RN: Deliah Boston, Other Clinician: Primary Care Lee-Anne Flicker: RUPASHREE Treating Britto, Bo Mcclintock, Towanda Hornstein/Extender: Referring Keyle Doby: Jake Samples in Treatment: 0 Vital Signs Height(in): 72 Pulse(bpm): 64 Weight(lbs): 222 Blood Pressure 140/73 (mmHg): Body Mass Index(BMI): 30 Temperature(F): 98.2 Respiratory Rate 16 (breaths/min): Photos: [N/A:N/A] Wound Location: Right Forearm N/A N/A Wounding Event: Trauma N/A N/A Primary Etiology: Trauma, Other N/A N/A Date Acquired: 04/06/2017 N/A N/A Weeks of Treatment: 0 N/A N/A Wound Status: Open N/A N/A Measurements L x W x D 5.6x5x0.1 N/A N/A (cm) Area (cm) : 21.991 N/A N/A Volume (cm) : 2.199 N/A N/A % Reduction in Area: 0.00% N/A N/A % Reduction in Volume: 0.00%  N/A N/A Classification: Partial Thickness N/A N/A Exudate Amount: Medium N/A N/A Exudate Type: Serous N/A N/A Exudate Color: amber N/A N/A Wound Margin: Flat and Intact N/A N/A Granulation Amount: None Present (0%) N/A N/A Necrotic Amount: Large (67-100%) N/A N/A Necrotic Tissue: Eschar, Adherent Slough N/A N/A Exposed Structures: N/A N/A DYLLEN, MENNING ([id number]) Fat Layer (Subcutaneous Tissue) Exposed: Yes Fascia: No Tendon: No Muscle: No Joint: No Bone: No Epithelialization: None N/A N/A Periwound Skin Texture: Scarring: Yes N/A N/A Excoriation: No Induration: No Callus: No Crepitus: No Rash: No Periwound Skin Maceration: No N/A N/A Moisture: Dry/Scaly: No Periwound Skin Color: Erythema: Yes N/A N/A Atrophie Blanche: No Cyanosis: No Ecchymosis: No Hemosiderin Staining: No Mottled: No Pallor: No Rubor: No Erythema Location: Circumferential N/A N/A Tenderness on No N/A N/A Palpation: Wound Preparation: Ulcer Cleansing: N/A N/A Rinsed/Irrigated with Saline Topical Anesthetic Applied: Other Treatment Notes Electronic Signature(s) Signed: 04/15/2017 3:43:44 PM By: Christin Fudge MD, FACS Entered By: Christin Fudge on  04/15/2017 14:02:26 Drew Calderon Drew Calderon ([id number]) -------------------------------------------------------------------------------- [place] Details Patient Name: AAIDEN, DEPOY 04/15/2017 1:00 Date of Service: PM Medical Record [id number] Number: Patient Account Number: [id number] Date of Birth/Sex: 01/17/1941 (76 y.o. Male) Treating RN: Deliah Boston, Other Clinician: Primary Care Devoiry Corriher: RUPASHREE Treating Britto, Bo Mcclintock, Eivin Mascio/Extender: Referring Dehaven Sine: Jake Samples in Treatment: 0 Active Inactive ` Abuse / Safety / Falls / Self Care Management Nursing Diagnoses: History of Falls Potential for falls Goals: Patient will remain injury free related to falls Date Initiated: 04/15/2017 Target Resolution Date: 04/26/2017 Goal Status: Active Interventions: Assess fall risk on admission and as needed Notes: ` Orientation to the Wound Care Program Nursing Diagnoses: Knowledge deficit related to the wound healing center program Goals: Patient/caregiver will verbalize understanding of the [facility] Program Date Initiated: 04/15/2017 Target Resolution Date: 04/26/2017 Goal Status: Active Interventions: Provide education on orientation to the wound center Notes: ` Wound/Skin Impairment KEONDRICK, DILKS ([id number]) Nursing Diagnoses: Impaired tissue integrity Goals: Ulcer/skin breakdown will heal within 14 weeks Date Initiated: 04/15/2017 Target Resolution Date: 07/29/2017 Goal Status: Active Interventions: Assess patient/caregiver ability to obtain necessary supplies Treatment Activities: Skin care regimen initiated : 04/15/2017 Topical wound management initiated : 04/15/2017 Notes: Electronic Signature(s) Signed: 04/15/2017 3:22:11 PM By: Gretta Cool, BSN, RN, CWS, Kim RN, BSN Entered By: Gretta Cool, BSN, RN, CWS, Kim on 04/15/2017 13:45:47 Drew Calderon Drew Calderon  ([id number]) -------------------------------------------------------------------------------- Pain Assessment Details Patient Name: Drew Calderon Drew Calderon. 04/15/2017 1:00 Date of Service: PM Medical Record [id number] Number: Patient Account Number: [id number] Date of Birth/Sex: 10/01/1940 (76 y.o. Male) Treating RN: Deliah Boston, Other Clinician: Primary Care Makyi Ledo: RUPASHREE Treating Britto, Bo Mcclintock, Lesleyanne Politte/Extender: Referring Junie Engram: Jake Samples in Treatment: 0 Active Problems Location of Pain Severity and Description of Pain Patient Has Paino No Site Locations With Dressing Change: No Pain Management and Medication Current Pain Management: Goals for Pain Management Topical or injectable lidocaine is offered to patient for acute pain when surgical debridement is performed. If needed, Patient is instructed to use over the counter pain medication for the following 24-48 hours after debridement. Wound care MDs do not prescribed pain medications. Patient has chronic pain or uncontrolled pain. Patient has been instructed to make an appointment with their Primary Care Physician for pain management. Electronic Signature(s) Signed: 04/15/2017 3:22:11 PM By: Gretta Cool, BSN, RN, CWS, Kim RN, BSN Entered By: Gretta Cool, BSN, RN, CWS, Kim on 04/15/2017 13:26:35 Drew Calderon Drew Calderon ([id number]) -------------------------------------------------------------------------------- Patient/Caregiver Education Details Patient Name: AODHAN, SCHEIDT 04/15/2017 1:00 Date of  Service: PM Medical Record [id number] Number: Patient Account Number: [id number] Date of Birth/Gender: 1941-04-12 (76 y.o. Male) Treating RN: Cornell Barman [place], Other Clinician: Physician: RUPASHREE Treating Britto, Bo Mcclintock, Physician/Extender: Referring Physician: Jake Samples in Treatment: 0 Education Assessment Education Provided To: Patient Education Topics  Provided Safety: Handouts: Personal Safety Methods: Demonstration Responses: State content correctly Welcome To [facility] [place]: Handouts: Welcome To [facility] [place] Methods: Demonstration, Explain/Verbal Responses: State content correctly Wound/Skin Impairment: Handouts: Caring for Your Ulcer Methods: Demonstration, Explain/Verbal Responses: State content correctly Electronic Signature(s) Signed: 04/15/2017 4:18:20 PM By: Gretta Cool, BSN, RN, CWS, Kim RN, BSN Entered By: Gretta Cool, BSN, RN, CWS, Kim on 04/15/2017 16:18:02 Drew Calderon Drew Calderon ([id number]) -------------------------------------------------------------------------------- Wound Assessment Details Patient Name: Drew Calderon, Drew Calderon 04/15/2017 1:00 Date of Service: PM Medical Record [id number] Number: Patient Account Number: [id number] Date of Birth/Sex: September 26, 1940 (76 y.o. Male) Treating RN: Deliah Boston, Other Clinician: Primary Care Gabrella Stroh: RUPASHREE Treating Britto, Bo Mcclintock, Alfreda Hammad/Extender: Referring Dason Mosley: Jake Samples in Treatment: 0 Wound Status Wound Number: 1 Primary Etiology: Trauma, Other Wound Location: Right Forearm Wound Status: Open Wounding Event: Trauma Date Acquired: 04/06/2017 Weeks Of Treatment: 0 Clustered Wound: No Photos Wound Measurements Length: (cm) 5.6 Width: (cm) 5 Depth: (cm) 0.1 Area: (cm) 21.991 Volume: (cm) 2.199 % Reduction in Area: 0% % Reduction in Volume: 0% Epithelialization: None Tunneling: No Undermining: No Wound Description Classification: Partial Thickness Foul Odor After Wound Margin: Flat and Intact Slough/Fibrino Exudate Amount: Medium Exudate Type: Serous Exudate Color: amber Cleansing: No Yes Wound Bed Granulation Amount: None Present (0%) Exposed Structure Necrotic Amount: Large (67-100%) Fascia Exposed: No Necrotic Quality: Eschar, Adherent Slough Fat Layer (Subcutaneous Tissue) Exposed: Yes Tendon Exposed:  No Muscle Exposed: No Drew Calderon Drew Calderon ([id number]) Joint Exposed: No Bone Exposed: No Periwound Skin Texture Texture Color No Abnormalities Noted: No No Abnormalities Noted: No Callus: No Atrophie Blanche: No Crepitus: No Cyanosis: No Excoriation: No Ecchymosis: No Induration: No Erythema: Yes Rash: No Erythema Location: Circumferential Scarring: Yes Hemosiderin Staining: No Mottled: No Moisture Pallor: No No Abnormalities Noted: No Rubor: No Dry / Scaly: No Maceration: No Wound Preparation Ulcer Cleansing: Rinsed/Irrigated with Saline Topical Anesthetic Applied: Other Treatment Notes Wound #1 (Right Forearm) 1. Cleansed with: Clean wound with Normal Saline 4. Dressing Applied: Mepitel 5. Secondary Dressing Applied Non-Adherent pad Notes ABD, conform and stretch net. Electronic Signature(s) Signed: 04/15/2017 3:22:11 PM By: Gretta Cool, BSN, RN, CWS, Kim RN, BSN Entered By: Gretta Cool, BSN, RN, CWS, Kim on 04/15/2017 13:32:44 Drew Calderon Drew Calderon ([id number]) -------------------------------------------------------------------------------- [place] Details Patient Name: Drew Calderon, Drew Calderon 04/15/2017 1:00 Date of Service: PM Medical Record [id number] Number: Patient Account Number: [id number] Date of Birth/Sex: 08-02-1940 (76 y.o. Male) Treating RN: Deliah Boston, Other Clinician: Primary Care Keiden Deskin: RUPASHREE Treating Britto, Bo Mcclintock, Cagney Degrace/Extender: Referring Amely Voorheis: Jake Samples in Treatment: 0 Vital Signs Time Taken: 13:17 Temperature (F): 98.2 Height (in): 72 Pulse (bpm): 64 Weight (lbs): 222 Respiratory Rate (breaths/min): 16 Body Mass Index (BMI): 30.1 Blood Pressure (mmHg): 140/73 Reference Range: 80 - 120 mg / dl Electronic Signature(s) Signed: 04/15/2017 3:22:11 PM By: Gretta Cool, BSN, RN, CWS, Kim RN, BSN Entered By: Gretta Cool, BSN, RN, CWS, Kim on 04/15/2017 13:26:42

## 2017-04-18 ENCOUNTER — Other Ambulatory Visit (HOSPITAL_COMMUNITY): Payer: Self-pay | Admitting: *Deleted

## 2017-04-19 ENCOUNTER — Encounter (HOSPITAL_COMMUNITY)
Admission: RE | Admit: 2017-04-19 | Discharge: 2017-04-19 | Disposition: A | Discharge status: Home / Self Care | Payer: Medicare Other | Source: Ambulatory Visit | Attending: Internal Medicine | Admitting: Internal Medicine

## 2017-04-19 LAB — POCT HEMOGLOBIN-HEMACUE: Hemoglobin: 15.6 g/dL (ref 13.0–17.0)

## 2017-04-19 MED ORDER — LIDOCAINE HCL (PF) 1 % IJ SOLN
INTRAMUSCULAR | Status: AC
  Administered 2017-04-19: 2 mL via INTRADERMAL
  Filled 2017-04-19: qty 2

## 2017-04-19 MED ORDER — LIDOCAINE HCL (PF) 1 % IJ SOLN
2.0000 mL | Freq: Once | INTRAMUSCULAR | Status: AC
  Administered 2017-04-19: 2 mL via INTRADERMAL

## 2017-04-19 NOTE — Progress Notes (Signed)
PEARLEY, BARANEK ([id number]) Visit Report for 04/15/2017 Chief Complaint Document Details Patient Name: Drew Calderon, Drew Calderon 04/15/2017 1:00 Date of Service: PM Medical Record [id number] Number: Patient Account Number: [id number] Date of Birth/Sex: Feb 15, 1941 (76 y.o. Male) Treating RN: Deliah Boston, Other Clinician: Primary Care Provider: RUPASHREE Treating Annelise Mccoy, Bo Mcclintock, Provider/Extender: Referring Provider: Jake Samples in Treatment: 0 Information Obtained from: Patient Chief Complaint Patient presents to the wound care center with open non-healing surgical wound(s) to the right forearm caused by a complex laceration on 04/06/2017 Electronic Signature(s) Signed: 04/15/2017 3:43:44 PM By: Christin Fudge MD, FACS Entered By: Christin Fudge on 04/15/2017 14:02:58 Drew Calderon ([id number]) -------------------------------------------------------------------------------- HPI Details Patient Name: Drew Calderon Date of Service: 04/15/2017 1:00 PM Medical Record Patient Account Number: [id number] Number: Treating RN: Cornell Barman Date of Birth/Sex: 04-01-1941 (75 y.o. Male) Other Clinician: [place], Treating ROBSON, MICHAEL Provider: RUPASHREE Provider/Extender: Renae Fickle, Referring Provider: Jake Samples in Treatment: 0 History of Present Illness Location: right lateral forearm just below elbow has a lacerated wound which is status post suturing Quality: Patient reports experiencing a dull pain to affected area(s). Severity: Patient states wound are getting worse. Duration: Patient has had the wound for < 2 weeks prior to presenting for treatment Timing: Pain in wound is Intermittent (comes and goes Context: The wound occurred when the patient had a fall in [place] and was taken to the OR for a complex lacerated suturing Modifying Factors: Other treatment(s) tried include:oral antibiotics and local  dressing Associated Signs and Symptoms: Patient reports having increase swelling. HPI Description: 76 year old male who was recently seen at the [facility] emergency medicine department at [facility], with a past medical history of hypertension, with complaints of right ankle swelling. He had recently returned from [place], [place], where he had recently tripped and fallen and was seen in the hospital where he underwent surgical debridement and laceration repair. He had 60 stitches placed to his right lateral forearm and was started on Keflex. He was concerned about a blood clot in his leg and would like to get evaluated for the same problem. x-rays done earlier showed no fracture. in the ER he was found to have edema of his right ankle and large ecchymosis to the right lateral thigh and hip. x-rays done at: Showed right ankle without acute bone abnormality and right foot showed no acute bone pathology. patient was started on doxycycline and a DVT study was recommended. this was done on 04/12/2017 -- Summary: - No evidence of deep vein or superficial thrombosis involving the right lower extremity and left common femoral vein. - No evidence of Bakerand's cyst on the right. I have reviewed electronic medical records of Dr. Cannon Kettle from [facility] -- instruction on 04/06/2017 recommended sutures and staples to come out in 11 days. x-rays done there showed right elbow 3 views -- no acute displaced fracture, and there were tiny foreign body radiopaque seen adjacent to the olecranon. X-ray right shoulder no acute fracture. x-ray of the pelvis and right hip -- no fracture. the patient was taken to the OR for closure of the wound. the procedure performed was a layered closure of complex laceration of the skin and soft tissue of the right elbow. Wound was irrigated with pulse irrigation of 5000 units of bacitracin and the patient also received 2 g of Ancef. The wound was debrided,  subcutaneous closed with 4-0 Vicryl interrupted sutures. A total of 30 4 interrupted sutures were applied to  the skin flaps. past medical history of hemochromatosis currently with phlebotomy 500 mL every 2 months, hypertension, colon polyps, ex-smoker stopped in 2014. Electronic Signature(s) ABISAI, DEER ([id number]) Signed: 04/15/2017 3:43:44 PM By: Christin Fudge MD, FACS Signed: 04/16/2017 4:13:20 PM By: Linton Ham MD Entered By: Christin Fudge on 04/15/2017 14:03:07 Drew Calderon ([id number]) -------------------------------------------------------------------------------- Physical Exam Details Patient Name: Drew Calderon 04/15/2017 1:00 Date of Service: PM Medical Record [id number] Number: Patient Account Number: [id number] Date of Birth/Sex: Jan 15, 1941 (76 y.o. Male) Treating RN: Deliah Boston, Other Clinician: Primary Care Provider: RUPASHREE Treating Willer Osorno, Bo Mcclintock, Provider/Extender: Referring Provider: Jake Samples in Treatment: 0 Constitutional . Pulse regular. Respirations normal and unlabored. Afebrile. . Eyes Nonicteric. Reactive to light. Ears, Nose, Mouth, and Throat Lips, teeth, and gums WNL.Marland Kitchen Moist mucosa without lesions. Neck supple and nontender. No palpable supraclavicular or cervical adenopathy. Normal sized without goiter. Respiratory WNL. No retractions.. Cardiovascular Pedal Pulses WNL. No clubbing, cyanosis or edema. Gastrointestinal (GI) Abdomen without masses or tenderness.. No liver or spleen enlargement or tenderness.. Lymphatic No adneopathy. No adenopathy. No adenopathy. Musculoskeletal Adexa without tenderness or enlargement.. Digits and nails w/o clubbing, cyanosis, infection, petechiae, ischemia, or inflammatory conditions.. Integumentary (Hair, Skin) No suspicious lesions. No crepitus or fluctuance. No peri-wound warmth or erythema. No masses.Marland Kitchen Psychiatric Judgement and insight Intact.. No  evidence of depression, anxiety, or agitation.. Notes the patient has a sutured wound to the right forearm just below the elbow region where there are multiple nylon sutures putting back skin and subcutaneous tissue. The skin is looking blanched and showing initial signs of necrosis and I believe the entire skin flap which was sutures in place is going to necrosis over the next few days. No debridement was done today Electronic Signature(s) Signed: 04/15/2017 3:43:44 PM By: Christin Fudge MD, FACS Entered By: Christin Fudge on 04/15/2017 14:04:29 Drew Calderon ([id number]) Drew Calderon ([id number]) -------------------------------------------------------------------------------- Physician Orders Details Patient Name: MONTE, ZINNI 04/15/2017 1:00 Date of Service: PM Medical Record [id number] Number: Patient Account Number: [id number] Date of Birth/Sex: 1940-11-19 (76 y.o. Male) Treating RN: Deliah Boston, Other Clinician: Primary Care Provider: RUPASHREE Treating Roy Snuffer, Bo Mcclintock, Provider/Extender: Referring Provider: Jake Samples in Treatment: 0 Verbal / Phone Orders: No Diagnosis Coding ICD-10 Coding Code Description K24.097D Unspecified open wound of right forearm, initial encounter S51.811A Laceration without foreign body of right forearm, initial encounter Wound Cleansing Wound #1 Right Forearm o Clean wound with Normal Saline. Primary Wound Dressing Wound #1 Right Forearm o Mepitel One Contact layer Secondary Dressing Wound #1 Right Forearm o Non-adherent pad o ABD and Kerlix/Conform Dressing Change Frequency Wound #1 Right Forearm o Other: - as needed due to moisture. Follow-up Appointments Wound #1 Right Forearm o Return Appointment in 1 week. Electronic Signature(s) Signed: 04/15/2017 4:15:31 PM By: Gretta Cool, BSN, RN, CWS, Kim RN, BSN Signed: 04/18/2017 2:14:22 PM By: Christin Fudge MD, FACS Entered By: Gretta Cool,  BSN, RN, CWS, Kim on 04/15/2017 16:15:30 Drew Calderon ([id number]) PHONG, ISENBERG ([id number]) -------------------------------------------------------------------------------- Problem List Details Patient Name: MACYN, REMMERT 04/15/2017 1:00 Date of Service: PM Medical Record [id number] Number: Patient Account Number: [id number] Date of Birth/Sex: 1940/12/22 (76 y.o. Male) Treating RN: Deliah Boston, Other Clinician: Primary Care Provider: RUPASHREE Treating Durell Lofaso, Bo Mcclintock, Provider/Extender: Referring Provider: Jake Samples in Treatment: 0 Active Problems ICD-10 Encounter Code Description Active Date Diagnosis S51.801A Unspecified open wound of right forearm, initial encounter 04/15/2017 Yes S51.811A Laceration without foreign body of right forearm, initial 04/15/2017 Yes encounter  Inactive Problems Resolved Problems Electronic Signature(s) Signed: 04/15/2017 3:43:44 PM By: Christin Fudge MD, FACS Entered By: Christin Fudge on 04/15/2017 14:02:18 Drew Calderon ([id number]) -------------------------------------------------------------------------------- Progress Note Details Patient Name: Drew Calderon 04/15/2017 1:00 Date of Service: PM Medical Record [id number] Number: Patient Account Number: [id number] Date of Birth/Sex: 1941-04-18 (76 y.o. Male) Treating RN: Deliah Boston, Other Clinician: Primary Care Provider: RUPASHREE Treating Channelle Bottger, Bo Mcclintock, Provider/Extender: Referring Provider: Jake Samples in Treatment: 0 Subjective Chief Complaint Information obtained from Patient Patient presents to the wound care center with open non-healing surgical wound(s) to the right forearm caused by a complex laceration on 04/06/2017 History of Present Illness (HPI) The following HPI elements were documented for the patient's wound: Location: right lateral forearm just below elbow has a lacerated wound  which is status post suturing Quality: Patient reports experiencing a dull pain to affected area(s). Severity: Patient states wound are getting worse. Duration: Patient has had the wound for < 2 weeks prior to presenting for treatment Timing: Pain in wound is Intermittent (comes and goes Context: The wound occurred when the patient had a fall in [place] and was taken to the OR for a complex lacerated suturing Modifying Factors: Other treatment(s) tried include:oral antibiotics and local dressing Associated Signs and Symptoms: Patient reports having increase swelling. 76 year old male who was recently seen at the [facility] emergency medicine department at [facility], with a past medical history of hypertension, with complaints of right ankle swelling. He had recently returned from [place], [place], where he had recently tripped and fallen and was seen in the hospital where he underwent surgical debridement and laceration repair. He had 60 stitches placed to his right lateral forearm and was started on Keflex. He was concerned about a blood clot in his leg and would like to get evaluated for the same problem. x-rays done earlier showed no fracture. in the ER he was found to have edema of his right ankle and large ecchymosis to the right lateral thigh and hip. x-rays done at: Showed right ankle without acute bone abnormality and right foot showed no acute bone pathology. patient was started on doxycycline and a DVT study was recommended. this was done on 04/12/2017 -- Summary: - No evidence of deep vein or superficial thrombosis involving the right lower extremity and left common femoral vein. - No evidence of Bakerand's cyst on the right. I have reviewed electronic medical records of Dr. Cannon Kettle from [facility] -- instruction on 04/06/2017 recommended sutures and staples to come out in 11 days. x-rays done there showed right elbow 3 views -- no acute displaced fracture, and  there were tiny foreign body radiopaque seen adjacent to the olecranon. X-ray right shoulder no acute fracture. x-ray of the pelvis and right hip -- no fracture. the patient was taken to the OR for closure of the wound. the procedure performed was a layered closure of JAMEIRE, KOUBA ([id number]) complex laceration of the skin and soft tissue of the right elbow. Wound was irrigated with pulse irrigation of 5000 units of bacitracin and the patient also received 2 g of Ancef. The wound was debrided, subcutaneous closed with 4-0 Vicryl interrupted sutures. A total of 30 4 interrupted sutures were applied to the skin flaps. past medical history of hemochromatosis currently with phlebotomy 500 mL every 2 months, hypertension, colon polyps, ex-smoker stopped in 2014. Wound History Patient presents with 1 open wound that has been present for approximately 2 weeks. Patient has been treating  wound in the following manner: bacitracin. Laboratory tests have not been performed in the last month. Patient reportedly has not tested positive for an antibiotic resistant organism. Patient reportedly has not tested positive for osteomyelitis. Patient reportedly has not had testing performed to evaluate circulation in the legs. Patient experiences the following problems associated with their wounds: infection, swelling. Patient History Allergies No Known Allergies Family History Cancer - Mother, Siblings, No family history of Diabetes, Heart Disease, Hereditary Spherocytosis, Hypertension, Kidney Disease, Lung Disease, Seizures, Stroke, Thyroid Problems, Tuberculosis. Social History Former smoker, Marital Status - Married, Alcohol Use - Moderate, Drug Use - No History, Caffeine Use - Moderate. Medical History Eyes Denies history of Cataracts, Glaucoma, Optic Neuritis Ear/Nose/Mouth/Throat Denies history of Chronic sinus problems/congestion, Middle ear problems Hematologic/Lymphatic Denies history of  Anemia, Hemophilia, Human Immunodeficiency Virus, Lymphedema, Sickle Cell Disease Respiratory Denies history of Aspiration, Asthma, Chronic Obstructive Pulmonary Disease (COPD), Pneumothorax, Sleep Apnea, Tuberculosis Cardiovascular Denies history of Angina, Arrhythmia, Congestive Heart Failure, Coronary Artery Disease, Deep Vein Thrombosis, Hypertension, Hypotension, Myocardial Infarction, Peripheral Arterial Disease, Peripheral Venous Disease, Phlebitis, Vasculitis Gastrointestinal Denies history of Cirrhosis , Colitis, Crohn s, Hepatitis A, Hepatitis B, Hepatitis C Endocrine Denies history of Type I Diabetes, Type II Diabetes Genitourinary Denies history of End Stage Renal Disease Immunological Denies history of Lupus Erythematosus, Raynaud s, Scleroderma Integumentary (Skin) Drew Calderon ([id number]) Denies history of History of Burn, History of pressure wounds Musculoskeletal Denies history of Gout, Rheumatoid Arthritis, Osteoarthritis, Osteomyelitis Neurologic Denies history of Dementia, Neuropathy, Quadriplegia, Paraplegia, Seizure Disorder Oncologic Denies history of Received Chemotherapy, Received Radiation Psychiatric Denies history of Anorexia/bulimia, Confinement Anxiety Medical And Surgical History Notes Constitutional Symptoms (General Health) HTN; Cholesterol; hemochromatosis Review of Systems (ROS) Constitutional Symptoms (General Health) The patient has no complaints or symptoms. Eyes The patient has no complaints or symptoms. Ear/Nose/Mouth/Throat The patient has no complaints or symptoms. Hematologic/Lymphatic The patient has no complaints or symptoms. Respiratory The patient has no complaints or symptoms. Cardiovascular Complains or has symptoms of LE edema - right foot dt fall. Denies complaints or symptoms of Chest pain. Gastrointestinal The patient has no complaints or symptoms. Endocrine The patient has no complaints or  symptoms. Genitourinary The patient has no complaints or symptoms. Immunological The patient has no complaints or symptoms. Integumentary (Skin) Complains or has symptoms of Wounds. Denies complaints or symptoms of Bleeding or bruising tendency, Breakdown, Swelling. Musculoskeletal The patient has no complaints or symptoms. Neurologic The patient has no complaints or symptoms. Oncologic The patient has no complaints or symptoms. Psychiatric The patient has no complaints or symptoms. HORACE, LUKAS ([id number]) Medications echinacea atorvastatin 10 mg tablet oral tablet oral amlodipine 5 mg tablet oral tablet oral Advil 200 mg tablet oral tablet oral aspirin 81 mg tablet,delayed release oral tablet,delayed release (DR/EC) oral Objective Constitutional Pulse regular. Respirations normal and unlabored. Afebrile. Vitals Time Taken: 1:17 PM, Height: 72 in, Weight: 222 lbs, BMI: 30.1, Temperature: 98.2 F, Pulse: 64 bpm, Respiratory Rate: 16 breaths/min, Blood Pressure: 140/73 mmHg. Eyes Nonicteric. Reactive to light. Ears, Nose, Mouth, and Throat Lips, teeth, and gums WNL.Marland Kitchen Moist mucosa without lesions. Neck supple and nontender. No palpable supraclavicular or cervical adenopathy. Normal sized without goiter. Respiratory WNL. No retractions.. Cardiovascular Pedal Pulses WNL. No clubbing, cyanosis or edema. Gastrointestinal (GI) Abdomen without masses or tenderness.. No liver or spleen enlargement or tenderness.. Lymphatic No adneopathy. No adenopathy. No adenopathy. Musculoskeletal Adexa without tenderness or enlargement.. Digits and nails w/o clubbing, cyanosis, infection, petechiae, ischemia, or inflammatory conditions.Marland Kitchen  Psychiatric Judgement and insight Intact.. No evidence of depression, anxiety, or agitation.Drew Calderon ([id number]) General Notes: the patient has a sutured wound to the right forearm just below the elbow region where there are multiple  nylon sutures putting back skin and subcutaneous tissue. The skin is looking blanched and showing initial signs of necrosis and I believe the entire skin flap which was sutures in place is going to necrosis over the next few days. No debridement was done today Integumentary (Hair, Skin) No suspicious lesions. No crepitus or fluctuance. No peri-wound warmth or erythema. No masses.. Wound #1 status is Open. Original cause of wound was Trauma. The wound is located on the Right Forearm. The wound measures 5.6cm length x 5cm width x 0.1cm depth; 21.991cm^2 area and 2.199cm^3 volume. There is Fat Layer (Subcutaneous Tissue) Exposed exposed. There is no tunneling or undermining noted. There is a medium amount of serous drainage noted. The wound margin is flat and intact. There is no granulation within the wound bed. There is a large (67-100%) amount of necrotic tissue within the wound bed including Eschar and Adherent Slough. The periwound skin appearance exhibited: Scarring, Erythema. The periwound skin appearance did not exhibit: Callus, Crepitus, Excoriation, Induration, Rash, Dry/Scaly, Maceration, Atrophie Blanche, Cyanosis, Ecchymosis, Hemosiderin Staining, Mottled, Pallor, Rubor. The surrounding wound skin color is noted with erythema which is circumferential. Assessment Active Problems ICD-10 S51.801A - Unspecified open wound of right forearm, initial encounter S51.811A - Laceration without foreign body of right forearm, initial encounter this 76 year old gentleman had a complex lacerated wound of his right forearm, on 04/06/2017 which was sutured after OR debridement and irrigation. Clinically the skin flap is not doing well and I suspect will necrose over the next few days. The patient is on oral antibiotics which she will continue and I have recommended a nonadherent dressing and a light Kerlix bandage over this and we will review him in a week's time. He had several questions answered to  his satisfaction Plan Wound Cleansing: Wound #1 Right Forearm: Clean wound with Normal Saline. ALIN, CHAVIRA ([id number]) Primary Wound Dressing: Wound #1 Right Forearm: Mepitel One Contact layer Secondary Dressing: Wound #1 Right Forearm: Non-adherent pad ABD and Kerlix/Conform Dressing Change Frequency: Wound #1 Right Forearm: Other: - as needed due to moisture. Follow-up Appointments: Wound #1 Right Forearm: Return Appointment in 1 week. this 76 year old gentleman had a complex lacerated wound of his right forearm, on 04/06/2017 which was sutured after OR debridement and irrigation. Clinically the skin flap is not doing well and I suspect will necrose over the next few days. The patient is on oral antibiotics which she will continue and I have recommended a nonadherent dressing and a light Kerlix bandage over this and we will review him in a week's time. He had several questions answered to his satisfaction Electronic Signature(s) Signed: 04/18/2017 2:14:22 PM By: Christin Fudge MD, FACS Previous Signature: 04/15/2017 3:43:44 PM Version By: Christin Fudge MD, FACS Entered By: Christin Fudge on 04/18/2017 13:49:35 Drew Calderon ([id number]) -------------------------------------------------------------------------------- ROS/PFSH Details Patient Name: Drew Calderon 04/15/2017 1:00 Date of Service: PM Medical Record [id number] Number: Patient Account Number: [id number] Date of Birth/Sex: 12/22/1940 (76 y.o. Male) Treating RN: Deliah Boston, Other Clinician: Primary Care Provider: RUPASHREE Treating Trulee Hamstra, Bo Mcclintock, Provider/Extender: Referring Provider: Jake Samples in Treatment: 0 Wound History Do you currently have one or more open woundso Yes How many open wounds do you currently haveo 1 Approximately how long have you had your woundso 2  weeks How have you been treating your wound(s) until nowo bacitracin Has your wound(s) ever  healed and then re-openedo No Have you had any lab work done in the past montho No Have you tested positive for an antibiotic resistant organism (MRSA, VRE)o No Have you tested positive for osteomyelitis (bone infection)o No Have you had any tests for circulation on your legso No Have you had other problems associated with your woundso Infection, Swelling Cardiovascular Complaints and Symptoms: Positive for: LE edema - right foot dt fall Negative for: Chest pain Medical History: Negative for: Angina; Arrhythmia; Congestive Heart Failure; Coronary Artery Disease; Deep Vein Thrombosis; Hypertension; Hypotension; Myocardial Infarction; Peripheral Arterial Disease; Peripheral Venous Disease; Phlebitis; Vasculitis Integumentary (Skin) Complaints and Symptoms: Positive for: Wounds Negative for: Bleeding or bruising tendency; Breakdown; Swelling Medical History: Negative for: History of Burn; History of pressure wounds Constitutional Symptoms (General Health) Complaints and Symptoms: No Complaints or Symptoms Medical History: Past Medical History Notes: SILVIO, SAUSEDO ([id number]) HTN; Cholesterol; hemochromatosis Eyes Complaints and Symptoms: No Complaints or Symptoms Medical History: Negative for: Cataracts; Glaucoma; Optic Neuritis Ear/Nose/Mouth/Throat Complaints and Symptoms: No Complaints or Symptoms Medical History: Negative for: Chronic sinus problems/congestion; Middle ear problems Hematologic/Lymphatic Complaints and Symptoms: No Complaints or Symptoms Medical History: Negative for: Anemia; Hemophilia; Human Immunodeficiency Virus; Lymphedema; Sickle Cell Disease Respiratory Complaints and Symptoms: No Complaints or Symptoms Medical History: Negative for: Aspiration; Asthma; Chronic Obstructive Pulmonary Disease (COPD); Pneumothorax; Sleep Apnea; Tuberculosis Gastrointestinal Complaints and Symptoms: No Complaints or Symptoms Medical History: Negative for:  Cirrhosis ; Colitis; Crohnos; Hepatitis A; Hepatitis B; Hepatitis C Endocrine Complaints and Symptoms: No Complaints or Symptoms Medical History: Negative for: Type I Diabetes; Type II Diabetes Genitourinary KEYONTA, BARRADAS ([id number]) Complaints and Symptoms: No Complaints or Symptoms Medical History: Negative for: End Stage Renal Disease Immunological Complaints and Symptoms: No Complaints or Symptoms Medical History: Negative for: Lupus Erythematosus; Raynaudos; Scleroderma Musculoskeletal Complaints and Symptoms: No Complaints or Symptoms Medical History: Negative for: Gout; Rheumatoid Arthritis; Osteoarthritis; Osteomyelitis Neurologic Complaints and Symptoms: No Complaints or Symptoms Medical History: Negative for: Dementia; Neuropathy; Quadriplegia; Paraplegia; Seizure Disorder Oncologic Complaints and Symptoms: No Complaints or Symptoms Medical History: Negative for: Received Chemotherapy; Received Radiation Psychiatric Complaints and Symptoms: No Complaints or Symptoms Medical History: Negative for: Anorexia/bulimia; Confinement Anxiety Immunizations Pneumococcal Vaccine: Received Pneumococcal Vaccination: Yes Family and Social History JEBEDIAH, MACRAE ([id number]) Cancer: Yes - Mother, Siblings; Diabetes: No; Heart Disease: No; Hereditary Spherocytosis: No; Hypertension: No; Kidney Disease: No; Lung Disease: No; Seizures: No; Stroke: No; Thyroid Problems: No; Tuberculosis: No; Former smoker; Marital Status - Married; Alcohol Use: Moderate; Drug Use: No History; Caffeine Use: Moderate; Advanced Directives: Yes (Not Provided); Patient does not want information on Advanced Directives; Living Will: Yes (Not Provided) Physician Affirmation I have reviewed and agree with the above information. Electronic Signature(s) Signed: 04/15/2017 3:22:11 PM By: Gretta Cool, BSN, RN, CWS, Kim RN, BSN Signed: 04/15/2017 3:43:44 PM By: Christin Fudge MD, FACS Entered By:  Christin Fudge on 04/15/2017 14:01:00 Drew Calderon ([id number]) -------------------------------------------------------------------------------- [place] Details Patient Name: Drew Calderon Date of Service: 04/15/2017 Medical Record Number: [id number] Patient Account Number: [id number] Date of Birth/Sex: Dec 10, 1940 (76 y.o. Male) Treating RN: Cornell Barman Primary Care Provider: Leeroy Cha Other Clinician: Referring Provider: Leeroy Cha Treating Provider/Extender: Frann Rider in Treatment: 0 Diagnosis Coding ICD-10 Codes Code Description Y10.175Z Unspecified open wound of right forearm, initial encounter S51.811A Laceration without foreign body of right forearm, initial encounter Facility Procedures CPT4 Code: [id number] Description: [phone] -  WOUND CARE VISIT-LEV 4 EST PT Modifier: Quantity: 1 Physician Procedures CPT4 Code Description: [id number] - WC PHYS LEVEL 3 - EST PT ICD-10 Description Diagnosis S51.801A Unspecified open wound of right forearm, initial encou S51.811A Laceration without foreign body of right forearm, init Modifier: nter ial encounte Quantity: 1 r Electronic Signature(s) Signed: 04/15/2017 4:18:20 PM By: Gretta Cool, BSN, RN, CWS, Kim RN, BSN Signed: 04/18/2017 2:14:22 PM By: Christin Fudge MD, FACS Previous Signature: 04/15/2017 3:43:44 PM Version By: Christin Fudge MD, FACS Entered By: Gretta Cool, BSN, RN, CWS, Kim on 04/15/2017 [place]

## 2017-04-19 NOTE — Progress Notes (Signed)
hemocue 15.6; phlebotomized 500cc and pt tolerated well.

## 2017-04-22 ENCOUNTER — Encounter: Payer: Medicare Other | Admitting: Surgery

## 2017-04-22 DIAGNOSIS — Z87891 Personal history of nicotine dependence: Secondary | ICD-10-CM | POA: Diagnosis not present

## 2017-04-22 DIAGNOSIS — I10 Essential (primary) hypertension: Secondary | ICD-10-CM | POA: Diagnosis not present

## 2017-04-22 DIAGNOSIS — S51811A Laceration without foreign body of right forearm, initial encounter: Secondary | ICD-10-CM | POA: Diagnosis not present

## 2017-04-22 DIAGNOSIS — Z7982 Long term (current) use of aspirin: Secondary | ICD-10-CM | POA: Diagnosis not present

## 2017-04-22 DIAGNOSIS — S51801A Unspecified open wound of right forearm, initial encounter: Secondary | ICD-10-CM | POA: Diagnosis not present

## 2017-04-23 ENCOUNTER — Ambulatory Visit: Payer: Medicare Other | Admitting: Internal Medicine

## 2017-04-23 NOTE — Progress Notes (Signed)
Drew, Calderon ([id number]) Visit Report for 04/22/2017 Arrival Information Details Patient Name: Drew Calderon, Drew Calderon 04/22/2017 1:30 Date of Service: PM Medical Record [id number] Number: Patient Account Number: [id number] Date of Birth/Sex: 10-07-1940 (76 y.o. Male) Treating RN: Ferdinand Cava, Other Clinician: Primary Care Charnika Herbst: RUPASHREE Treating Britto, Bo Mcclintock, Jahi Roza/Extender: Referring Nelly Scriven: Jake Samples in Treatment: 1 Visit Information History Since Last Visit Added or deleted any medications: No Patient Arrived: Ambulatory Any new allergies or adverse reactions: No Arrival Time: 14:02 Had a fall or experienced change in No Accompanied By: spouse activities of daily living that may affect Transfer Assistance: None risk of falls: Patient Identification Verified: Yes Signs or symptoms of abuse/neglect since last No Secondary Verification Process Yes visito Completed: Hospitalized since last visit: No Patient Requires Transmission- No Has Dressing in Place as Prescribed: Yes Based Precautions: Pain Present Now: No Patient Has Alerts: Yes Patient Alerts: Patient on Blood Thinner 81mg  aspirin Electronic Signature(s) Signed: 04/22/2017 5:03:19 PM By: Montey Hora Entered By: Montey Hora on 04/22/2017 14:03:14 Drew Calderon ([id number]) -------------------------------------------------------------------------------- Encounter Discharge Information Details Patient Name: Drew Calderon. 04/22/2017 1:30 Date of Service: PM Medical Record [id number] Number: Patient Account Number: [id number] Date of Birth/Sex: 1941/05/19 (76 y.o. Male) Treating RN: Ferdinand Cava, Other Clinician: Primary Care Shonn Farruggia: RUPASHREE Treating Britto, Bo Mcclintock, Devondre Guzzetta/Extender: Referring Strummer Canipe: Jake Samples in Treatment: 1 Encounter Discharge Information Items Discharge Pain Level: 0 Discharge  Condition: Stable Ambulatory Status: Ambulatory Discharge Destination: Home Transportation: Private Auto Accompanied By: spouse Schedule Follow-up Appointment: Yes Medication Reconciliation completed and provided to Patient/Care No Cason Luffman: Provided on Clinical Summary of Care: 04/22/2017 Form Type Recipient Paper Patient WS Electronic Signature(s) Signed: 04/22/2017 5:03:19 PM By: Montey Hora Entered By: Montey Hora on 04/22/2017 16:17:37 Drew Calderon ([id number]) -------------------------------------------------------------------------------- Multi Wound Chart Details Patient Name: Drew Calderon. 04/22/2017 1:30 Date of Service: PM Medical Record [id number] Number: Patient Account Number: [id number] Date of Birth/Sex: 11-20-1940 (76 y.o. Male) Treating RN: Ferdinand Cava, Other Clinician: Primary Care Milah Recht: RUPASHREE Treating Britto, Bo Mcclintock, Kwamaine Cuppett/Extender: Referring Chazlyn Cude: Jake Samples in Treatment: 1 Vital Signs Height(in): 72 Pulse(bpm): 66 Weight(lbs): 222 Blood Pressure 146/86 (mmHg): Body Mass Index(BMI): 30 Temperature(F): 98.5 Respiratory Rate 16 (breaths/min): Photos: [1:No Photos] [N/A:N/A] Wound Location: [1:Right Forearm] [N/A:N/A] Wounding Event: [1:Trauma] [N/A:N/A] Primary Etiology: [1:Trauma, Other] [N/A:N/A] Date Acquired: [1:04/06/2017] [N/A:N/A] Weeks of Treatment: [1:1] [N/A:N/A] Wound Status: [1:Open] [N/A:N/A] Measurements L x W x D 6x5x0.2 [N/A:N/A] (cm) Area (cm) : [1:23.562] [N/A:N/A] Volume (cm) : [1:4.712] [N/A:N/A] % Reduction in Area: [1:-7.10%] [N/A:N/A] % Reduction in Volume: -114.30% [N/A:N/A] Classification: [1:Partial Thickness] [N/A:N/A] Exudate Amount: [1:Medium] [N/A:N/A] Exudate Type: [1:Serous] [N/A:N/A] Exudate Color: [1:amber] [N/A:N/A] Wound Margin: [1:Flat and Intact] [N/A:N/A] Granulation Amount: [1:None Present (0%)] [N/A:N/A] Necrotic Amount:  [1:Large (67-100%)] [N/A:N/A] Necrotic Tissue: [1:Eschar, Adherent Slough] [N/A:N/A] Exposed Structures: [1:Fat Layer (Subcutaneous Tissue) Exposed: Yes Fascia: No Tendon: No Muscle: No] [N/A:N/A] Joint: No Bone: No Epithelialization: None N/A N/A Debridement: Debridement (38101- N/A N/A 11047) Pre-procedure 14:16 N/A N/A Verification/Time Out Taken: Pain Control: Lidocaine 4% Topical N/A N/A Solution Tissue Debrided: Necrotic/Eschar, N/A N/A Fibrin/Slough, Other, Subcutaneous Level: Skin/Subcutaneous N/A N/A Tissue Debridement Area (sq 30 N/A N/A cm): Instrument: Forceps, Scissors N/A N/A Bleeding: Minimum N/A N/A Hemostasis Achieved: Pressure N/A N/A Procedural Pain: 0 N/A N/A Post Procedural Pain: 0 N/A N/A Debridement Treatment Procedure was tolerated N/A N/A Response: well Post Debridement 5x6x0.4 N/A N/A Measurements L x W x D (cm) Post Debridement 9.425 N/A N/A Volume: (cm)  Periwound Skin Texture: Scarring: Yes N/A N/A Excoriation: No Induration: No Callus: No Crepitus: No Rash: No Periwound Skin Maceration: No N/A N/A Moisture: Dry/Scaly: No Periwound Skin Color: Erythema: Yes N/A N/A Atrophie Blanche: No Cyanosis: No Ecchymosis: No Hemosiderin Staining: No Mottled: No Pallor: No Rubor: No Erythema Location: Circumferential N/A N/A Tenderness on No N/A N/A Palpation: Wound Preparation: Ulcer Cleansing: N/A N/A Rinsed/Irrigated with Drew Calderon ([id number]) Saline Topical Anesthetic Applied: Other: lidocaine 4% Procedures Performed: Debridement N/A N/A Treatment Notes Electronic Signature(s) Signed: 04/22/2017 4:01:09 PM By: Christin Fudge MD, FACS Entered By: Christin Fudge on 04/22/2017 14:59:24 Drew Calderon ([id number]) -------------------------------------------------------------------------------- [place] Details Patient Name: Drew, Calderon 04/22/2017 1:30 Date of Service: PM Medical  Record [id number] Number: Patient Account Number: [id number] Date of Birth/Sex: 1941-01-25 (76 y.o. Male) Treating RN: Ferdinand Cava, Other Clinician: Primary Care Kadi Hession: RUPASHREE Treating Britto, Bo Mcclintock, Roshawna Colclasure/Extender: Referring Danialle Dement: Jake Samples in Treatment: 1 Active Inactive ` Abuse / Safety / Falls / Self Care Management Nursing Diagnoses: History of Falls Potential for falls Goals: Patient will remain injury free related to falls Date Initiated: 04/15/2017 [place] Resolution Date: 04/26/2017 Goal Status: Active Interventions: Assess fall risk on admission and as needed Notes: ` Orientation to the Wound Care Program Nursing Diagnoses: Knowledge deficit related to the wound healing center program Goals: Patient/caregiver will verbalize understanding of the [place] Date Initiated: 04/15/2017 Target Resolution Date: 04/26/2017 Goal Status: Active Interventions: Provide education on orientation to the wound center Notes: ` Wound/Skin Impairment GEORDAN, XU ([id number]) Nursing Diagnoses: Impaired tissue integrity Goals: Ulcer/skin breakdown will heal within 14 weeks Date Initiated: 04/15/2017 Target Resolution Date: 07/29/2017 Goal Status: Active Interventions: Assess patient/caregiver ability to obtain necessary supplies Treatment Activities: Skin care regimen initiated : 04/15/2017 Topical wound management initiated : 04/15/2017 Notes: Electronic Signature(s) Signed: 04/22/2017 5:03:19 PM By: Montey Hora Entered By: Montey Hora on 04/22/2017 14:17:41 Drew Calderon ([id number]) -------------------------------------------------------------------------------- Pain Assessment Details Patient Name: Drew Calderon 04/22/2017 1:30 Date of Service: PM Medical Record [id number] Number: Patient Account Number: [id number] Date of Birth/Sex: 02-Apr-1941 (76 y.o. Male) Treating RN:  Ferdinand Cava, Other Clinician: Primary Care Bethanne Mule: RUPASHREE Treating Britto, Bo Mcclintock, Benigno Check/Extender: Referring Jo-Anne Kluth: Jake Samples in Treatment: 1 Active Problems Location of Pain Severity and Description of Pain Patient Has Paino No Site Locations Pain Management and Medication Current Pain Management: Notes Topical or injectable lidocaine is offered to patient for acute pain when surgical debridement is performed. If needed, Patient is instructed to use over the counter pain medication for the following 24-48 hours after debridement. Wound care MDs do not prescribed pain medications. Patient has chronic pain or uncontrolled pain. Patient has been instructed to make an appointment with their Primary Care Physician for pain management. Electronic Signature(s) Signed: 04/22/2017 5:03:19 PM By: Montey Hora Entered By: Montey Hora on 04/22/2017 14:03:23 Drew Calderon ([id number]) -------------------------------------------------------------------------------- Patient/Caregiver Education Details Patient Name: KEAGON, GLASCOE 04/22/2017 1:30 Date of Service: PM Medical Record [id number] Number: Patient Account Number: [id number] Date of Birth/Gender: 03/03/1941 (76 y.o. Male) Treating RN: Montey Hora Draper, Other Clinician: Physician: RUPASHREE Treating Britto, Bo Mcclintock, Physician/Extender: Referring Physician: Jake Samples in Treatment: 1 Education Assessment Education Provided To: Patient and Caregiver Education Topics Provided Wound/Skin Impairment: Handouts: Other: wound care as ordered Methods: Demonstration, Explain/Verbal Responses: State content correctly Electronic Signature(s) Signed: 04/22/2017 5:03:19 PM By: Montey Hora Entered By: Montey Hora on 04/22/2017 16:18:02 Drew Calderon  ([id number]) -------------------------------------------------------------------------------- Wound  Assessment Details Patient Name: MACGREGOR, AESCHLIMAN 04/22/2017 1:30 Date of Service: PM Medical Record [id number] Number: Patient Account Number: [id number] Date of Birth/Sex: January 01, 1941 (76 y.o. Male) Treating RN: Ferdinand Cava, Other Clinician: Primary Care Johathan Province: RUPASHREE Treating Britto, Bo Mcclintock, Kaytlen Lightsey/Extender: Referring Jarin Cornfield: Jake Samples in Treatment: 1 Wound Status Wound Number: 1 Primary Etiology: Trauma, Other Wound Location: Right Forearm Wound Status: Open Wounding Event: Trauma Date Acquired: 04/06/2017 Weeks Of Treatment: 1 Clustered Wound: No Photos Photo Uploaded By: Montey Hora on 04/22/2017 16:57:03 Wound Measurements Length: (cm) 6 Width: (cm) 5 Depth: (cm) 0.2 Area: (cm) 23.562 Volume: (cm) 4.712 % Reduction in Area: -7.1% % Reduction in Volume: -114.3% Epithelialization: None Tunneling: No Undermining: No Wound Description Classification: Partial Thickness Foul Odor After Wound Margin: Flat and Intact Slough/Fibrino Exudate Amount: Medium Exudate Type: Serous Exudate Color: amber Cleansing: No Yes Wound Bed Granulation Amount: None Present (0%) Exposed Structure Necrotic Amount: Large (67-100%) Fascia Exposed: No BRAXDEN, LOVERING ([id number]) Necrotic Quality: Eschar, Adherent Slough Fat Layer (Subcutaneous Tissue) Exposed: Yes Tendon Exposed: No Muscle Exposed: No Joint Exposed: No Bone Exposed: No Periwound Skin Texture Texture Color No Abnormalities Noted: No No Abnormalities Noted: No Callus: No Atrophie Blanche: No Crepitus: No Cyanosis: No Excoriation: No Ecchymosis: No Induration: No Erythema: Yes Rash: No Erythema Location: Circumferential Scarring: Yes Hemosiderin Staining: No Mottled: No Moisture Pallor: No No Abnormalities Noted: No Rubor: No Dry / Scaly:  No Maceration: No Wound Preparation Ulcer Cleansing: Rinsed/Irrigated with Saline Topical Anesthetic Applied: Other: lidocaine 4%, Treatment Notes Wound #1 (Right Forearm) 1. Cleansed with: Clean wound with Normal Saline 2. Anesthetic Topical Lidocaine 4% cream to wound bed prior to debridement 4. Dressing Applied: Santyl Ointment 5. Secondary Dressing Applied Guaze, ABD and kerlix/Conform 7. Secured with Tape Notes stretch net. Electronic Signature(s) Signed: 04/22/2017 5:03:19 PM By: Montey Hora Entered By: Montey Hora on 04/22/2017 14:17:32 Drew Calderon ([id number]) -------------------------------------------------------------------------------- [place] Details Patient Name: RASHED, EDLER 04/22/2017 1:30 Date of Service: PM Medical Record [id number] Number: Patient Account Number: [id number] Date of Birth/Sex: [birth year] (76 y.o. Male) Treating RN: Ferdinand Cava, Other Clinician: Primary Care Tolbert Matheson: RUPASHREE Treating Britto, Bo Mcclintock, Vennie Waymire/Extender: Referring Porshia Blizzard: Jake Samples in Treatment: 1 Vital Signs Time Taken: 14:03 Temperature (F): 98.5 Height (in): 72 Pulse (bpm): 66 Weight (lbs): 222 Respiratory Rate (breaths/min): 16 Body Mass Index (BMI): 30.1 Blood Pressure (mmHg): 146/86 Reference Range: 80 - 120 mg / dl Electronic Signature(s) Signed: 04/22/2017 5:03:19 PM By: Montey Hora Entered By: Montey Hora on 04/22/2017 14:05:22

## 2017-04-23 NOTE — Progress Notes (Signed)
Drew Calderon ([id number]) Visit Report for 04/22/2017 Chief Complaint Document Details Patient Name: Drew Calderon, Drew Calderon 04/22/2017 1:30 Date of Service: PM Medical Record [id number] Number: Patient Account Number: [id number] Date of Birth/Sex: Nov 01, 1940 (76 y.o. Male) Treating RN: Ferdinand Cava, Other Clinician: Primary Care Provider: RUPASHREE Treating Deaysia Grigoryan, Bo Mcclintock, Provider/Extender: Referring Provider: Jake Samples in Treatment: 1 Information Obtained from: Patient Chief Complaint Patient presents to the wound care center with open non-healing surgical wound(s) to the right forearm caused by a complex laceration on 04/06/2017 Electronic Signature(s) Signed: 04/22/2017 4:01:09 PM By: Christin Fudge MD, FACS Entered By: Christin Fudge on 04/22/2017 14:59:41 Drew Calderon ([id number]) -------------------------------------------------------------------------------- Debridement Details Patient Name: Drew Calderon 04/22/2017 1:30 Date of Service: PM Medical Record [id number] Number: Patient Account Number: [id number] Date of Birth/Sex: 1941/04/20 (76 y.o. Male) Treating RN: Ferdinand Cava, Other Clinician: Primary Care Provider: RUPASHREE Treating Nivek Powley, Bo Mcclintock, Provider/Extender: Referring Provider: Jake Samples in Treatment: 1 Debridement Performed for Wound #1 Right Forearm Assessment: Performed By: Physician Christin Fudge, MD Debridement: Debridement Pre-procedure Verification/Time Out Yes - 14:16 Taken: Start Time: 14:16 Pain Control: Lidocaine 4% Topical Solution Level: Skin/Subcutaneous Tissue Total Area Debrided (L x 6 (cm) x 5 (cm) = 30 (cm) W): Tissue and other Viable, Non-Viable, Eschar, Fibrin/Slough, Other, Subcutaneous material debrided: Instrument: Forceps, Scissors Bleeding: Minimum Hemostasis Achieved: Pressure End Time: 14:29 Procedural Pain: 0 Post Procedural Pain:  0 Response to Treatment: Procedure was tolerated well Post Debridement Measurements of Total Wound Length: (cm) 5 Width: (cm) 6 Depth: (cm) 0.4 Volume: (cm) 9.425 Character of Wound/Ulcer Post Improved Debridement: Post Procedure Diagnosis Same as Pre-procedure Electronic Signature(s) Signed: 04/22/2017 4:01:09 PM By: Christin Fudge MD, FACS Signed: 04/22/2017 5:03:19 PM By: Kalman Drape ([id number]) Entered By: Christin Fudge on 04/22/2017 14:59:32 Drew Calderon ([id number]) -------------------------------------------------------------------------------- HPI Details Patient Name: Drew Calderon 04/22/2017 1:30 Date of Service: PM Medical Record [id number] Number: Patient Account Number: [id number] Date of Birth/Sex: 1940/10/15 (76 y.o. Male) Treating RN: Ferdinand Cava, Other Clinician: Primary Care Provider: RUPASHREE Treating Evelia Waskey, Bo Mcclintock, Provider/Extender: Referring Provider: Jake Samples in Treatment: 1 History of Present Illness Location: right lateral forearm just below elbow has a lacerated wound which is status post suturing Quality: Patient reports experiencing a dull pain to affected area(s). Severity: Patient states wound are getting worse. Duration: Patient has had the wound for < 2 weeks prior to presenting for treatment Timing: Pain in wound is Intermittent (comes and goes Context: The wound occurred when the patient had a fall in [place] and was taken to the OR for a complex lacerated suturing Modifying Factors: Other treatment(s) tried include:oral antibiotics and local dressing Associated Signs and Symptoms: Patient reports having increase swelling. HPI Description: 76 year old male who was recently seen at the [facility] emergency medicine department at [facility], with a past medical history of hypertension, with complaints of right ankle swelling. He had recently returned from  [place], [place], where he had recently tripped and fallen and was seen in the hospital where he underwent surgical debridement and laceration repair. He had 60 stitches placed to his right lateral forearm and was started on Keflex. He was concerned about a blood clot in his leg and would like to get evaluated for the same problem. x-rays done earlier showed no fracture. in the ER he was found to have edema of his right ankle and large ecchymosis to the right lateral thigh and hip. x-rays done at: Showed right ankle  without acute bone abnormality and right foot showed no acute bone pathology. patient was started on doxycycline and a DVT study was recommended. this was done on 04/12/2017 -- Summary: - No evidence of deep vein or superficial thrombosis involving the right lower extremity and left common femoral vein. - No evidence of Bakerand's cyst on the right. I have reviewed electronic medical records of Dr. Cannon Kettle from [facility] -- instruction on 04/06/2017 recommended sutures and staples to come out in 11 days. x-rays done there showed right elbow 3 views -- no acute displaced fracture, and there were tiny foreign body radiopaque seen adjacent to the olecranon. X-ray right shoulder no acute fracture. x-ray of the pelvis and right hip -- no fracture. the patient was taken to the OR for closure of the wound. the procedure performed was a layered closure of complex laceration of the skin and soft tissue of the right elbow. Wound was irrigated with pulse irrigation of 5000 units of bacitracin and the patient also received 2 g of Ancef. The wound was debrided, subcutaneous closed with 4-0 Vicryl interrupted sutures. A total of 30 4 interrupted sutures were applied to the skin flaps. past medical history of hemochromatosis currently with phlebotomy 500 mL every 2 months, hypertension, colon polyps, ex-smoker stopped in 2014. 04/22/2017 -- the right forearm wound has completely necrosed and  several nylon sutures are in place Drew Calderon ([id number]) Electronic Signature(s) Signed: 04/22/2017 4:01:09 PM By: Christin Fudge MD, FACS Entered By: Christin Fudge on 04/22/2017 15:00:30 Drew Calderon ([id number]) -------------------------------------------------------------------------------- Physical Exam Details Patient Name: Drew Calderon 04/22/2017 1:30 Date of Service: PM Medical Record [id number] Number: Patient Account Number: [id number] Date of Birth/Sex: 03-14-1941 (76 y.o. Male) Treating RN: Ferdinand Cava, Other Clinician: Primary Care Provider: RUPASHREE Treating July Nickson, Bo Mcclintock, Provider/Extender: Referring Provider: Jake Samples in Treatment: 1 Constitutional . Pulse regular. Respirations normal and unlabored. Afebrile. . Eyes Nonicteric. Reactive to light. Ears, Nose, Mouth, and Throat Lips, teeth, and gums WNL.Marland Kitchen Moist mucosa without lesions. Neck supple and nontender. No palpable supraclavicular or cervical adenopathy. Normal sized without goiter. Respiratory WNL. No retractions.. Cardiovascular Pedal Pulses WNL. No clubbing, cyanosis or edema. Lymphatic No adneopathy. No adenopathy. No adenopathy. Musculoskeletal Adexa without tenderness or enlargement.. Digits and nails w/o clubbing, cyanosis, infection, petechiae, ischemia, or inflammatory conditions.. Integumentary (Hair, Skin) No suspicious lesions. No crepitus or fluctuance. No peri-wound warmth or erythema. No masses.Marland Kitchen Psychiatric Judgement and insight Intact.. No evidence of depression, anxiety, or agitation.. Notes the right forearm wound has completely necrosed and there is full thickness necrosis of skin and subcutaneous tissue and this was sharply removed with a forcep and scissors and all the necrotic debris which could possibly be removed has been done. Electronic Signature(s) Signed: 04/22/2017 4:01:09 PM By: Christin Fudge MD, FACS Entered  By: Christin Fudge on 04/22/2017 15:01:10 Drew Calderon ([id number]) -------------------------------------------------------------------------------- Physician Orders Details Patient Name: KAHEEM, HALLECK 04/22/2017 1:30 Date of Service: PM Medical Record [id number] Number: Patient Account Number: [id number] Date of Birth/Sex: August 04, 1940 (76 y.o. Male) Treating RN: Ferdinand Cava, Other Clinician: Primary Care Provider: RUPASHREE Treating Eros Montour, Bo Mcclintock, Provider/Extender: Referring Provider: Jake Samples in Treatment: 1 Verbal / Phone Orders: No Diagnosis Coding Wound Cleansing Wound #1 Right Forearm o Clean wound with Normal Saline. Primary Wound Dressing Wound #1 Right Forearm o Santyl Ointment Secondary Dressing Wound #1 Right Forearm o Gauze, ABD and Kerlix/Conform Dressing Change Frequency Wound #1 Right Forearm o Change dressing every day. Follow-up Appointments  Wound #1 Right Forearm o Return Appointment in 1 week. Medications-please add to medication list. Wound #1 Right Forearm o Santyl Enzymatic Ointment Patient Medications Allergies: No Known Allergies Notifications Medication Indication Start End Santyl 04/22/2017 DOSE topical 250 unit/gram ointment - ointment topical as directed DAELIN, HASTE ([id number]) Electronic Signature(s) Signed: 04/22/2017 3:02:22 PM By: Christin Fudge MD, FACS Entered By: Christin Fudge on 04/22/2017 15:02:22 Drew Calderon ([id number]) -------------------------------------------------------------------------------- Problem List Details Patient Name: YOVANNI, FRENETTE 04/22/2017 1:30 Date of Service: PM Medical Record [id number] Number: Patient Account Number: [id number] Date of Birth/Sex: Jun 12, 1941 (76 y.o. Male) Treating RN: Ferdinand Cava, Other Clinician: Primary Care Provider: RUPASHREE Treating Thorne Wirz, Bo Mcclintock,  Provider/Extender: Referring Provider: Jake Samples in Treatment: 1 Active Problems ICD-10 Encounter Code Description Active Date Diagnosis S51.801A Unspecified open wound of right forearm, initial encounter 04/15/2017 Yes S51.811A Laceration without foreign body of right forearm, initial 04/15/2017 Yes encounter Inactive Problems Resolved Problems Electronic Signature(s) Signed: 04/22/2017 4:01:09 PM By: Christin Fudge MD, FACS Entered By: Christin Fudge on 04/22/2017 14:59:20 Drew Calderon ([id number]) -------------------------------------------------------------------------------- Progress Note Details Patient Name: Drew Calderon 04/22/2017 1:30 Date of Service: PM Medical Record [id number] Number: Patient Account Number: [id number] Date of Birth/Sex: 02-09-1941 (76 y.o. Male) Treating RN: Ferdinand Cava, Other Clinician: Primary Care Provider: RUPASHREE Treating Io Dieujuste, Bo Mcclintock, Provider/Extender: Referring Provider: Jake Samples in Treatment: 1 Subjective Chief Complaint Information obtained from Patient Patient presents to the wound care center with open non-healing surgical wound(s) to the right forearm caused by a complex laceration on 04/06/2017 History of Present Illness (HPI) The following HPI elements were documented for the patient's wound: Location: right lateral forearm just below elbow has a lacerated wound which is status post suturing Quality: Patient reports experiencing a dull pain to affected area(s). Severity: Patient states wound are getting worse. Duration: Patient has had the wound for < 2 weeks prior to presenting for treatment Timing: Pain in wound is Intermittent (comes and goes Context: The wound occurred when the patient had a fall in [place] and was taken to the OR for a complex lacerated suturing Modifying Factors: Other treatment(s) tried include:oral antibiotics and local dressing Associated  Signs and Symptoms: Patient reports having increase swelling. 76 year old male who was recently seen at the [facility] emergency medicine department at [facility], with a past medical history of hypertension, with complaints of right ankle swelling. He had recently returned from [place], [place], where he had recently tripped and fallen and was seen in the hospital where he underwent surgical debridement and laceration repair. He had 60 stitches placed to his right lateral forearm and was started on Keflex. He was concerned about a blood clot in his leg and would like to get evaluated for the same problem. x-rays done earlier showed no fracture. in the ER he was found to have edema of his right ankle and large ecchymosis to the right lateral thigh and hip. x-rays done at: Showed right ankle without acute bone abnormality and right foot showed no acute bone pathology. patient was started on doxycycline and a DVT study was recommended. this was done on 04/12/2017 -- Summary: - No evidence of deep vein or superficial thrombosis involving the right lower extremity and left common femoral vein. - No evidence of Bakerand's cyst on the right. I have reviewed electronic medical records of Dr. Cannon Kettle from [facility] -- instruction on 04/06/2017 recommended sutures and staples to come out in 11 days. x-rays done there showed  right elbow 3 views -- no acute displaced fracture, and there were tiny foreign body radiopaque seen adjacent to the olecranon. X-ray right shoulder no acute fracture. x-ray of the pelvis and right hip -- no fracture. the patient was taken to the OR for closure of the wound. the procedure performed was a layered closure of LC, JOYNT ([id number]) complex laceration of the skin and soft tissue of the right elbow. Wound was irrigated with pulse irrigation of 5000 units of bacitracin and the patient also received 2 g of Ancef. The wound was debrided,  subcutaneous closed with 4-0 Vicryl interrupted sutures. A total of 30 4 interrupted sutures were applied to the skin flaps. past medical history of hemochromatosis currently with phlebotomy 500 mL every 2 months, hypertension, colon polyps, ex-smoker stopped in 2014. 04/22/2017 -- the right forearm wound has completely necrosed and several nylon sutures are in place Objective Constitutional Pulse regular. Respirations normal and unlabored. Afebrile. Vitals Time Taken: 2:03 PM, Height: 72 in, Weight: 222 lbs, BMI: 30.1, Temperature: 98.5 F, Pulse: 66 bpm, Respiratory Rate: 16 breaths/min, Blood Pressure: 146/86 mmHg. Eyes Nonicteric. Reactive to light. Ears, Nose, Mouth, and Throat Lips, teeth, and gums WNL.Marland Kitchen Moist mucosa without lesions. Neck supple and nontender. No palpable supraclavicular or cervical adenopathy. Normal sized without goiter. Respiratory WNL. No retractions.. Cardiovascular Pedal Pulses WNL. No clubbing, cyanosis or edema. Lymphatic No adneopathy. No adenopathy. No adenopathy. Musculoskeletal Adexa without tenderness or enlargement.. Digits and nails w/o clubbing, cyanosis, infection, petechiae, ischemia, or inflammatory conditions.Marland Kitchen Psychiatric Judgement and insight Intact.. No evidence of depression, anxiety, or agitation.Drew Calderon ([id number]) General Notes: the right forearm wound has completely necrosed and there is full thickness necrosis of skin and subcutaneous tissue and this was sharply removed with a forcep and scissors and all the necrotic debris which could possibly be removed has been done. Integumentary (Hair, Skin) No suspicious lesions. No crepitus or fluctuance. No peri-wound warmth or erythema. No masses.. Wound #1 status is Open. Original cause of wound was Trauma. The wound is located on the Right Forearm. The wound measures 6cm length x 5cm width x 0.2cm depth; 23.562cm^2 area and 4.712cm^3 volume. There is Fat Layer  (Subcutaneous Tissue) Exposed exposed. There is no tunneling or undermining noted. There is a medium amount of serous drainage noted. The wound margin is flat and intact. There is no granulation within the wound bed. There is a large (67-100%) amount of necrotic tissue within the wound bed including Eschar and Adherent Slough. The periwound skin appearance exhibited: Scarring, Erythema. The periwound skin appearance did not exhibit: Callus, Crepitus, Excoriation, Induration, Rash, Dry/Scaly, Maceration, Atrophie Blanche, Cyanosis, Ecchymosis, Hemosiderin Staining, Mottled, Pallor, Rubor. The surrounding wound skin color is noted with erythema which is circumferential. Assessment Active Problems ICD-10 S51.801A - Unspecified open wound of right forearm, initial encounter S51.811A - Laceration without foreign body of right forearm, initial encounter Procedures Wound #1 Pre-procedure diagnosis of Wound #1 is a Trauma, Other located on the Right Forearm . There was a Skin/Subcutaneous Tissue Debridement ([id number]) debridement with total area of 30 sq cm performed by Christin Fudge, MD. with the following instrument(s): Forceps and Scissors to remove Viable and Non- Viable tissue/material including Fibrin/Slough, Eschar, Other, and Subcutaneous after achieving pain control using Lidocaine 4% Topical Solution. A time out was conducted at 14:16, prior to the start of the procedure. A Minimum amount of bleeding was controlled with Pressure. The procedure was tolerated well with a pain level of 0 throughout and  a pain level of 0 following the procedure. Post Debridement Measurements: 5cm length x 6cm width x 0.4cm depth; 9.425cm^3 volume. Character of Wound/Ulcer Post Debridement is improved. Post procedure Diagnosis Wound #1: Same as Pre-Procedure KHASIR, WOODROME ([id number]) Plan Wound Cleansing: Wound #1 Right Forearm: Clean wound with Normal Saline. Primary Wound Dressing: Wound #1  Right Forearm: Santyl Ointment Secondary Dressing: Wound #1 Right Forearm: Gauze, ABD and Kerlix/Conform Dressing Change Frequency: Wound #1 Right Forearm: Change dressing every day. Follow-up Appointments: Wound #1 Right Forearm: Return Appointment in 1 week. Medications-please add to medication list.: Wound #1 Right Forearm: Santyl Enzymatic Ointment The following medication(s) was prescribed: Santyl topical 250 unit/gram ointment ointment topical as directed starting 04/22/2017 After the wound was sharply debrided and all the sutures were removed I have recommended application of Santyl ointment daily with a bordered foam and come to see me back again next week. we also discussed appropriate amount of protein, vitamin A, vitamin C and zinc Electronic Signature(s) Signed: 04/22/2017 4:01:09 PM By: Christin Fudge MD, FACS Entered By: Christin Fudge on 04/22/2017 15:03:17 Drew Calderon ([id number]) -------------------------------------------------------------------------------- SuperBill Details Patient Name: Drew Calderon. Date of Service: 04/22/2017 Medical Record Number: [id number] Patient Account Number: [id number] Date of Birth/Sex: April 22, 1941 (76 y.o. Male) Treating RN: Montey Hora Primary Care Provider: Leeroy Cha Other Clinician: Referring Provider: Leeroy Cha Treating Provider/Extender: Frann Rider in Treatment: 1 Diagnosis Coding ICD-10 Codes Code Description S51.801A Unspecified open wound of right forearm, initial encounter S51.811A Laceration without foreign body of right forearm, initial encounter Facility Procedures CPT4 Code Description: [id number] 11042 - DEB SUBQ TISSUE 20 SQ CM/< ICD-10 Description Diagnosis S51.801A Unspecified open wound of right forearm, initial encou S51.811A Laceration without foreign body of right forearm, init Modifier: nter ial encount Quantity: 1 er CPT4 Code Description: [id number] 11045 -  DEB SUBQ TISS EA ADDL 20CM ICD-10 Description Diagnosis S51.801A Unspecified open wound of right forearm, initial encou S51.811A Laceration without foreign body of right forearm, init Modifier: nter ial encount Quantity: 1 er Physician Procedures CPT4 Code Description: [id number] 64332 - WC PHYS SUBQ TISS 20 SQ CM ICD-10 Description Diagnosis S51.801A Unspecified open wound of right forearm, initial encou S51.811A Laceration without foreign body of right forearm, init Modifier: nter ial encount Quantity: 1 er CPT4 Code Description: [id number] 66063 - WC PHYS SUBQ TISS EA ADDL 20 CM ICD-10 Description Diagnosis S51.801A Unspecified open wound of right forearm, initial encou S51.811A Laceration without foreign body of right forearm, init HERMON, ZEA  ([id number]) Modifier: nter ial encount Quantity: 1 er Electronic Signature(s) Signed: 04/22/2017 4:01:09 PM By: Christin Fudge MD, FACS Entered By: Christin Fudge on 04/22/2017 15:04:40

## 2017-04-29 ENCOUNTER — Encounter: Payer: Medicare Other | Attending: Surgery | Admitting: Surgery

## 2017-04-29 DIAGNOSIS — S51811A Laceration without foreign body of right forearm, initial encounter: Secondary | ICD-10-CM | POA: Insufficient documentation

## 2017-04-29 DIAGNOSIS — S51801A Unspecified open wound of right forearm, initial encounter: Secondary | ICD-10-CM | POA: Diagnosis not present

## 2017-04-29 DIAGNOSIS — I10 Essential (primary) hypertension: Secondary | ICD-10-CM | POA: Diagnosis not present

## 2017-04-29 DIAGNOSIS — Z86010 Personal history of colonic polyps: Secondary | ICD-10-CM | POA: Insufficient documentation

## 2017-04-29 DIAGNOSIS — X58XXXA Exposure to other specified factors, initial encounter: Secondary | ICD-10-CM | POA: Insufficient documentation

## 2017-04-30 NOTE — Progress Notes (Signed)
Drew Calderon, Drew Calderon ([id number]) Visit Report for 04/29/2017 Arrival Information Details Patient Name: Drew Calderon, Drew Calderon 04/29/2017 1:45 Date of Service: PM Medical Record [id number] Number: Patient Account Number: [id number] Date of Birth/Sex: 11/18/1940 (76 y.o. Male) Treating RN: Deliah Boston, Other Clinician: Primary Care Monica Zahler: RUPASHREE Treating Britto, Bo Mcclintock, Veryl Abril/Extender: Referring Tiron Suski: Jake Samples in Treatment: 2 Visit Information History Since Last Visit Added or deleted any medications: No Patient Arrived: Ambulatory Any new allergies or adverse reactions: No Arrival Time: 13:55 Had a fall or experienced change in No Accompanied By: wife activities of daily living that may affect Transfer Assistance: None risk of falls: Patient Identification Verified: Yes Signs or symptoms of abuse/neglect since last No Secondary Verification Process Yes visito Completed: Hospitalized since last visit: No Patient Requires Transmission- No Has Dressing in Place as Prescribed: Yes Based Precautions: Pain Present Now: No Patient Has Alerts: Yes Patient Alerts: Patient on Blood Thinner 81mg  aspirin Electronic Signature(s) Signed: 04/29/2017 4:25:02 PM By: Gretta Cool, BSN, RN, CWS, Kim RN, BSN Entered By: Gretta Cool, BSN, RN, CWS, Kim on 04/29/2017 13:56:01 [name] ([id number]) -------------------------------------------------------------------------------- Encounter Discharge Information Details Patient Name: Drew Calderon, Drew Calderon 04/29/2017 1:45 Date of Service: PM Medical Record [id number] Number: Patient Account Number: [id number] Date of Birth/Sex: 1940-09-13 (76 y.o. Male) Treating RN: Deliah Boston, Other Clinician: Primary Care Ripley Bogosian: RUPASHREE Treating Britto, Bo Mcclintock, Ilda Laskin/Extender: Referring Vasco Chong: Jake Samples in Treatment: 2 Encounter Discharge Information Items Discharge Pain  Level: 0 Discharge Condition: Stable Ambulatory Status: Ambulatory Discharge Destination: Home Transportation: Private Auto Accompanied By: self Schedule Follow-up Appointment: Yes Medication Reconciliation completed and provided to Patient/Care Yes Yohana Bartha: Provided on Clinical Summary of Care: 04/29/2017 Form Type Recipient Paper Patient WS Electronic Signature(s) Signed: 04/29/2017 4:25:02 PM By: Gretta Cool, BSN, RN, CWS, Kim RN, BSN Entered By: Gretta Cool, BSN, RN, CWS, Kim on 04/29/2017 14:21:10 [name] ([id number]) -------------------------------------------------------------------------------- Lower Extremity Assessment Details Patient Name: Drew Calderon, Drew Calderon 04/29/2017 1:45 Date of Service: PM Medical Record [id number] Number: Patient Account Number: [id number] Date of Birth/Sex: [birth year] (76 y.o. Male) Treating RN: Deliah Boston, Other Clinician: Primary Care Emmakate Hypes: RUPASHREE Treating Britto, Bo Mcclintock, Aviyana Sonntag/Extender: Referring Sheril Hammond: Jake Samples in Treatment: 2 Electronic Signature(s) Signed: 04/29/2017 4:25:02 PM By: Gretta Cool, BSN, RN, CWS, Kim RN, BSN Entered By: Gretta Cool, BSN, RN, CWS, Kim on 04/29/2017 14:01:30 [name] ([id number]) -------------------------------------------------------------------------------- Multi Wound Chart Details Patient Name: Drew Calderon, Drew Calderon 04/29/2017 1:45 Date of Service: PM Medical Record [id number] Number: Patient Account Number: [id number] Date of Birth/Sex: [birth year] (76 y.o. Male) Treating RN: Deliah Boston, Other Clinician: Primary Care Taniesha Glanz: RUPASHREE Treating Britto, Bo Mcclintock, Kurt Hoffmeier/Extender: Referring Vonne Mcdanel: Jake Samples in Treatment: 2 Vital Signs Height(in): 72 Pulse(bpm): 59 Weight(lbs): 222 Blood Pressure 148/71 (mmHg): Body Mass Index(BMI): 30 Temperature(F): 97.7 Respiratory Rate 16 (breaths/min): Photos:  [N/A:N/A] Wound Location: Right Forearm N/A N/A Wounding Event: Trauma N/A N/A Primary Etiology: Trauma, Other N/A N/A Date Acquired: 04/06/2017 N/A N/A Weeks of Treatment: 2 N/A N/A Wound Status: Open N/A N/A Measurements L x W x D 2.5x3.8x0.1 N/A N/A (cm) Area (cm) : 7.461 N/A N/A Volume (cm) : 0.746 N/A N/A % Reduction in Area: 66.10% N/A N/A % Reduction in Volume: 66.10% N/A N/A Classification: Partial Thickness N/A N/A Exudate Amount: Medium N/A N/A Exudate Type: Serous N/A N/A Exudate Color: amber N/A N/A Wound Margin: Flat and Intact N/A N/A Granulation Amount: None Present (0%) N/A N/A Necrotic Amount: Large (67-100%) N/A N/A Necrotic Tissue: Eschar, Adherent Slough N/A N/A Exposed Structures:  N/A N/A SANDER, REMEDIOS ([id number]) Fat Layer (Subcutaneous Tissue) Exposed: Yes Fascia: No Tendon: No Muscle: No Joint: No Bone: No Epithelialization: None N/A N/A Debridement: Debridement (60109- N/A N/A 11047) Pre-procedure 14:09 N/A N/A Verification/Time Out Taken: Pain Control: Other N/A N/A Tissue Debrided: Fibrin/Slough, N/A N/A Subcutaneous Level: Skin/Subcutaneous N/A N/A Tissue Debridement Area (sq 9.5 N/A N/A cm): Instrument: Forceps N/A N/A Bleeding: Minimum N/A N/A Hemostasis Achieved: Pressure N/A N/A Procedural Pain: 1 N/A N/A Post Procedural Pain: 0 N/A N/A Debridement Treatment Procedure was tolerated N/A N/A Response: well Post Debridement 2.5x3.8x0.1 N/A N/A Measurements L x W x D (cm) Post Debridement 0.746 N/A N/A Volume: (cm) Periwound Skin Texture: Scarring: Yes N/A N/A Excoriation: No Induration: No Callus: No Crepitus: No Rash: No Periwound Skin Maceration: No N/A N/A Moisture: Dry/Scaly: No Periwound Skin Color: Erythema: Yes N/A N/A Atrophie Blanche: No Cyanosis: No Ecchymosis: No Hemosiderin Staining: No Mottled: No Pallor: No Rubor: No Erythema Location: Circumferential N/A N/A No N/A N/A SHAE, AUGELLO  ([id number]) Tenderness on Palpation: Wound Preparation: Ulcer Cleansing: N/A N/A Rinsed/Irrigated with Saline Topical Anesthetic Applied: Other: lidocaine 4% Procedures Performed: Debridement N/A N/A Treatment Notes Wound #1 (Right Forearm) 1. Cleansed with: Clean wound with Normal Saline 2. Anesthetic Topical Lidocaine 4% cream to wound bed prior to debridement 4. Dressing Applied: Santyl Ointment 5. Secondary Dressing Applied Kerlix/Conform [place] Notes stretch net. Electronic Signature(s) Signed: 04/29/2017 2:27:26 PM By: Christin Fudge MD, FACS Entered By: Christin Fudge on 04/29/2017 14:27:26 [name] ([id number]) -------------------------------------------------------------------------------- [place] Details Patient Name: Drew Calderon, Drew Calderon 04/29/2017 1:45 Date of Service: PM Medical Record [id number] Number: Patient Account Number: [id number] Date of Birth/Sex: 03/02/1941 (76 y.o. Male) Treating RN: Deliah Boston, Other Clinician: Primary Care Baker Moronta: RUPASHREE Treating Britto, Bo Mcclintock, Dionisia Pacholski/Extender: Referring Aidenn Skellenger: Jake Samples in Treatment: 2 Active Inactive ` Abuse / Safety / Falls / Self Care Management Nursing Diagnoses: History of Falls Potential for falls Goals: Patient will remain injury free related to falls Date Initiated: 04/15/2017 Target Resolution Date: 04/26/2017 Goal Status: Active Interventions: Assess fall risk on admission and as needed Notes: ` Orientation to the Wound Care Program Nursing Diagnoses: Knowledge deficit related to the wound healing center program Goals: Patient/caregiver will verbalize understanding of the [place] Program Date Initiated: 04/15/2017 Target Resolution Date: 04/26/2017 Goal Status: Active Interventions: Provide education on orientation to the wound center Notes: ` Wound/Skin Impairment AJA, BOLANDER  ([id number]) Nursing Diagnoses: Impaired tissue integrity Goals: Ulcer/skin breakdown will heal within 14 weeks Date Initiated: 04/15/2017 Target Resolution Date: 07/29/2017 Goal Status: Active Interventions: Assess patient/caregiver ability to obtain necessary supplies Treatment Activities: Skin care regimen initiated : 04/15/2017 Topical wound management initiated : 04/15/2017 Notes: Electronic Signature(s) Signed: 04/29/2017 4:25:02 PM By: Gretta Cool, BSN, RN, CWS, Kim RN, BSN Entered By: Gretta Cool, BSN, RN, CWS, Kim on 04/29/2017 14:03:40 [name] ([id number]) -------------------------------------------------------------------------------- Pain Assessment Details Patient Name: [name] 04/29/2017 1:45 Date of Service: PM Medical Record [id number] Number: Patient Account Number: [id number] Date of Birth/Sex: July 06, 1941 (76 y.o. Male) Treating RN: Deliah Boston, Other Clinician: Primary Care Ivadell Gaul: RUPASHREE Treating Britto, Bo Mcclintock, Willem Klingensmith/Extender: Referring Becket Wecker: Jake Samples in Treatment: 2 Active Problems Location of Pain Severity and Description of Pain Patient Has Paino No Site Locations With Dressing Change: No Pain Management and Medication Current Pain Management: Goals for Pain Management Topical or injectable lidocaine is offered to patient for acute pain when surgical debridement is performed. If needed, Patient is  instructed to use over the counter pain medication for the following 24-48 hours after debridement. Wound care MDs do not prescribed pain medications. Patient has chronic pain or uncontrolled pain. Patient has been instructed to make an appointment with their Primary Care Physician for pain management. Electronic Signature(s) Signed: 04/29/2017 4:25:02 PM By: Gretta Cool, BSN, RN, CWS, Kim RN, BSN Entered By: Gretta Cool, BSN, RN, CWS, Kim on 04/29/2017 13:56:23 [name]  ([id number]) -------------------------------------------------------------------------------- Patient/Caregiver Education Details Patient Name: Drew Calderon, Drew Calderon 04/29/2017 1:45 Date of Service: PM Medical Record [id number] Number: Patient Account Number: [id number] Date of Birth/Gender: 1940-12-02 (76 y.o. Male) Treating RN: Cornell Barman Wiggins, Other Clinician: Physician: RUPASHREE Treating Britto, Bo Mcclintock, Physician/Extender: Referring Physician: Jake Samples in Treatment: 2 Education Assessment Education Provided To: Patient Education Topics Provided Wound/Skin Impairment: Handouts: Caring for Your Ulcer, Other: continue wound care as prescribed Methods: Demonstration Responses: State content correctly Electronic Signature(s) Signed: 04/29/2017 4:25:02 PM By: Gretta Cool, BSN, RN, CWS, Kim RN, BSN Entered By: Gretta Cool, BSN, RN, CWS, Kim on 04/29/2017 14:21:30 [name] ([id number]) -------------------------------------------------------------------------------- Wound Assessment Details Patient Name: Drew Calderon, Drew Calderon 04/29/2017 1:45 Date of Service: PM Medical Record [id number] Number: Patient Account Number: [id number] Date of Birth/Sex: 1941/04/01 (76 y.o. Male) Treating RN: Deliah Boston, Other Clinician: Primary Care Florean Hoobler: RUPASHREE Treating Britto, Bo Mcclintock, Kawthar Ennen/Extender: Referring Kandis Henry: Jake Samples in Treatment: 2 Wound Status Wound Number: 1 Primary Etiology: Trauma, Other Wound Location: Right Forearm Wound Status: Open Wounding Event: Trauma Date Acquired: 04/06/2017 Weeks Of Treatment: 2 Clustered Wound: No Photos Wound Measurements Length: (cm) 2.5 Width: (cm) 3.8 Depth: (cm) 0.1 Area: (cm) 7.461 Volume: (cm) 0.746 % Reduction in Area: 66.1% % Reduction in Volume: 66.1% Epithelialization: None Tunneling: No Undermining: No Wound Description Classification: Partial  Thickness Foul Odor After Wound Margin: Flat and Intact Slough/Fibrino Exudate Amount: Medium Exudate Type: Serous Exudate Color: amber Cleansing: No Yes Wound Bed Granulation Amount: None Present (0%) Exposed Structure Necrotic Amount: Large (67-100%) Fascia Exposed: No Necrotic Quality: Eschar, Adherent Slough Fat Layer (Subcutaneous Tissue) Exposed: Yes Tendon Exposed: No Muscle Exposed: No [name] ([id number]) Joint Exposed: No Bone Exposed: No Periwound Skin Texture Texture Color No Abnormalities Noted: No No Abnormalities Noted: No Callus: No Atrophie Blanche: No Crepitus: No Cyanosis: No Excoriation: No Ecchymosis: No Induration: No Erythema: Yes Rash: No Erythema Location: Circumferential Scarring: Yes Hemosiderin Staining: No Mottled: No Moisture Pallor: No No Abnormalities Noted: No Rubor: No Dry / Scaly: No Maceration: No Wound Preparation Ulcer Cleansing: Rinsed/Irrigated with Saline Topical Anesthetic Applied: Other: lidocaine 4%, Treatment Notes Wound #1 (Right Forearm) 1. Cleansed with: Clean wound with Normal Saline 2. Anesthetic Topical Lidocaine 4% cream to wound bed prior to debridement 4. Dressing Applied: Santyl Ointment 5. Secondary Dressing Applied Kerlix/Conform [place] Notes stretch net. Electronic Signature(s) Signed: 04/29/2017 4:25:02 PM By: Gretta Cool, BSN, RN, CWS, Kim RN, BSN Entered By: Gretta Cool, BSN, RN, CWS, Kim on 04/29/2017 14:01:19 [name] ([id number]) -------------------------------------------------------------------------------- [place] Details Patient Name: Drew Calderon, Drew Calderon 04/29/2017 1:45 Date of Service: PM Medical Record [id number] Number: Patient Account Number: [id number] Date of Birth/Sex: Dec 25, 1940 (76 y.o. Male) Treating RN: Deliah Boston, Other Clinician: Primary Care Shaianne Nucci: RUPASHREE Treating Britto, Bo Mcclintock, Mehtaab Mayeda/Extender: Referring  Bird Swetz: Jake Samples in Treatment: 2 Vital Signs Time Taken: 13:56 Temperature (F): 97.7 Height (in): 72 Pulse (bpm): 59 Weight (lbs): 222 Respiratory Rate (breaths/min): 16 Body Mass Index (BMI): 30.1 Blood Pressure (mmHg): 148/71 Reference Range: 80 - 120 mg / dl  Electronic Signature(s) Signed: 04/29/2017 4:25:02 PM By: Gretta Cool, BSN, RN, CWS, Kim RN, BSN Entered By: Gretta Cool, BSN, RN, CWS, Kim on 04/29/2017 13:56:42

## 2017-04-30 NOTE — Progress Notes (Signed)
SEBERT, STOLLINGS ([id number]) Visit Report for 04/29/2017 Chief Complaint Document Details Patient Name: Drew Calderon, Drew Calderon 04/29/2017 1:45 Date of Service: PM Medical Record [id number] Number: Patient Account Number: [id number] Date of Birth/Sex: 10/02/1940 (76 y.o. Male) Treating RN: Deliah Boston, Other Clinician: Primary Care Provider: RUPASHREE Treating Marylen Zuk, Bo Mcclintock, Provider/Extender: Referring Provider: Jake Samples in Treatment: 2 Information Obtained from: Patient Chief Complaint Patient presents to the wound care center with open non-healing surgical wound(s) to the right forearm caused by a complex laceration on 04/06/2017 Electronic Signature(s) Signed: 04/29/2017 2:27:34 PM By: Christin Fudge MD, FACS Entered By: Christin Fudge on 04/29/2017 14:27:34 Drew Calderon ([id number]) -------------------------------------------------------------------------------- Debridement Details Patient Name: Drew Calderon 04/29/2017 1:45 Date of Service: PM Medical Record [id number] Number: Patient Account Number: [id number] Date of Birth/Sex: 1941-06-08 (76 y.o. Male) Treating RN: Deliah Boston, Other Clinician: Primary Care Provider: RUPASHREE Treating Damyiah Moxley, Bo Mcclintock, Provider/Extender: Referring Provider: Jake Samples in Treatment: 2 Debridement Performed for Wound #1 Right Forearm Assessment: Performed By: Physician Christin Fudge, MD Debridement: Debridement Pre-procedure Verification/Time Out Yes - 14:09 Taken: Start Time: 14:09 Pain Control: Other : lidocaine 4% Level: Skin/Subcutaneous Tissue Total Area Debrided (L x 2.5 (cm) x 3.8 (cm) = 9.5 (cm) W): Tissue and other Viable, Non-Viable, Fibrin/Slough, Subcutaneous material debrided: Instrument: Forceps Bleeding: Minimum Hemostasis Achieved: Pressure End Time: 14:10 Procedural Pain: 1 Post Procedural Pain: 0 Response to Treatment: Procedure  was tolerated well Post Debridement Measurements of Total Wound Length: (cm) 2.5 Width: (cm) 3.8 Depth: (cm) 0.1 Volume: (cm) 0.746 Character of Wound/Ulcer Post Stable Debridement: Post Procedure Diagnosis Same as Pre-procedure Electronic Signature(s) Signed: 04/29/2017 2:28:44 PM By: Christin Fudge MD, FACS Signed: 04/29/2017 4:25:02 PM By: Gretta Cool, BSN, RN, CWS, Kim RN, BSN Drew Calderon ([id number]) Entered By: Christin Fudge on 04/29/2017 14:28:43 Drew Calderon ([id number]) -------------------------------------------------------------------------------- HPI Details Patient Name: Drew Calderon, Drew Calderon 04/29/2017 1:45 Date of Service: PM Medical Record [id number] Number: Patient Account Number: [id number] Date of Birth/Sex: 1940/09/09 (76 y.o. Male) Treating RN: Deliah Boston, Other Clinician: Primary Care Provider: RUPASHREE Treating Jonny Longino, Bo Mcclintock, Provider/Extender: Referring Provider: Jake Samples in Treatment: 2 History of Present Illness Location: right lateral forearm just below elbow has a lacerated wound which is status post suturing Quality: Patient reports experiencing a dull pain to affected area(s). Severity: Patient states wound are getting worse. Duration: Patient has had the wound for < 2 weeks prior to presenting for treatment Timing: Pain in wound is Intermittent (comes and goes Context: The wound occurred when the patient had a fall in [place] and was taken to the OR for a complex lacerated suturing Modifying Factors: Other treatment(s) tried include:oral antibiotics and local dressing Associated Signs and Symptoms: Patient reports having increase swelling. HPI Description: 76 year old male who was recently seen at the [facility] emergency medicine department at [facility], with a past medical history of hypertension, with complaints of right ankle swelling. He had recently returned from [place], [place], where  he had recently tripped and fallen and was seen in the hospital where he underwent surgical debridement and laceration repair. He had 60 stitches placed to his right lateral forearm and was started on Keflex. He was concerned about a blood clot in his leg and would like to get evaluated for the same problem. x-rays done earlier showed no fracture. in the ER he was found to have edema of his right ankle and large ecchymosis to the right lateral thigh and hip. x-rays done at: Showed  right ankle without acute bone abnormality and right foot showed no acute bone pathology. patient was started on doxycycline and a DVT study was recommended. this was done on 04/12/2017 -- Summary: - No evidence of deep vein or superficial thrombosis involving the right lower extremity and left common femoral vein. - No evidence of Bakerand's cyst on the right. I have reviewed electronic medical records of Dr. Cannon Kettle from [facility] -- instruction on 04/06/2017 recommended sutures and staples to come out in 11 days. x-rays done there showed right elbow 3 views -- no acute displaced fracture, and there were tiny foreign body radiopaque seen adjacent to the olecranon. X-ray right shoulder no acute fracture. x-ray of the pelvis and right hip -- no fracture. the patient was taken to the OR for closure of the wound. the procedure performed was a layered closure of complex laceration of the skin and soft tissue of the right elbow. Wound was irrigated with pulse irrigation of 5000 units of bacitracin and the patient also received 2 g of Ancef. The wound was debrided, subcutaneous closed with 4-0 Vicryl interrupted sutures. A total of 30 4 interrupted sutures were applied to the skin flaps. past medical history of hemochromatosis currently with phlebotomy 500 mL every 2 months, hypertension, colon polyps, ex-smoker stopped in 2014. 04/22/2017 -- the right forearm wound has completely necrosed and several nylon  sutures are in place Drew Calderon, Drew Calderon ([id number]) Electronic Signature(s) Signed: 04/29/2017 2:27:39 PM By: Christin Fudge MD, FACS Entered By: Christin Fudge on 04/29/2017 14:27:39 Drew Calderon ([id number]) -------------------------------------------------------------------------------- Physical Exam Details Patient Name: Drew Calderon 04/29/2017 1:45 Date of Service: PM Medical Record [id number] Number: Patient Account Number: [id number] Date of Birth/Sex: Aug 27, 1940 (76 y.o. Male) Treating RN: Deliah Boston, Other Clinician: Primary Care Provider: RUPASHREE Treating Keyira Mondesir, Bo Mcclintock, Provider/Extender: Referring Provider: Jake Samples in Treatment: 2 Constitutional . Pulse regular. Respirations normal and unlabored. Afebrile. . Eyes Nonicteric. Reactive to light. Ears, Nose, Mouth, and Throat Lips, teeth, and gums WNL.Marland Kitchen Moist mucosa without lesions. Neck supple and nontender. No palpable supraclavicular or cervical adenopathy. Normal sized without goiter. Respiratory WNL. No retractions.. Cardiovascular Pedal Pulses WNL. No clubbing, cyanosis or edema. Lymphatic No adneopathy. No adenopathy. No adenopathy. Musculoskeletal Adexa without tenderness or enlargement.. Digits and nails w/o clubbing, cyanosis, infection, petechiae, ischemia, or inflammatory conditions.. Integumentary (Hair, Skin) No suspicious lesions. No crepitus or fluctuance. No peri-wound warmth or erythema. No masses.Marland Kitchen Psychiatric Judgement and insight Intact.. No evidence of depression, anxiety, or agitation.. Notes the right forearm wound needed some sharp debridement with a toothed forcep and a subcuticular suture was also removed possibly a vicryl. There is some healthy granulation tissue visible Electronic Signature(s) Signed: 04/29/2017 2:28:24 PM By: Christin Fudge MD, FACS Entered By: Christin Fudge on 04/29/2017 14:28:24 Drew Calderon  ([id number]) -------------------------------------------------------------------------------- Physician Orders Details Patient Name: Drew Calderon, Drew Calderon 04/29/2017 1:45 Date of Service: PM Medical Record [id number] Number: Patient Account Number: [id number] Date of Birth/Sex: Oct 26, 1940 (76 y.o. Male) Treating RN: Deliah Boston, Other Clinician: Primary Care Provider: RUPASHREE Treating Arien Morine, Bo Mcclintock, Provider/Extender: Referring Provider: Jake Samples in Treatment: 2 Verbal / Phone Orders: No Diagnosis Coding Wound Cleansing Wound #1 Right Forearm o Clean wound with Normal Saline. Primary Wound Dressing Wound #1 Right Forearm o Santyl Ointment Secondary Dressing Wound #1 Right Forearm o ABD and Kerlix/Conform Dressing Change Frequency Wound #1 Right Forearm o Change dressing every day. Follow-up Appointments Wound #1 Right Forearm o Return Appointment in 1 week. Medications-please  add to medication list. Wound #1 Right Forearm o Santyl Enzymatic Ointment Electronic Signature(s) Signed: 04/29/2017 4:25:02 PM By: Gretta Cool, BSN, RN, CWS, Kim RN, BSN Signed: 04/29/2017 4:37:28 PM By: Christin Fudge MD, FACS Entered By: Gretta Cool, BSN, RN, CWS, Kim on 04/29/2017 14:11:57 Drew Calderon ([id number]) -------------------------------------------------------------------------------- Problem List Details Patient Name: Drew Calderon, Drew Calderon 04/29/2017 1:45 Date of Service: PM Medical Record [id number] Number: Patient Account Number: [id number] Date of Birth/Sex: 1940/12/19 (77 y.o. Male) Treating RN: Deliah Boston, Other Clinician: Primary Care Provider: RUPASHREE Treating Kanan Sobek, Bo Mcclintock, Provider/Extender: Referring Provider: Jake Samples in Treatment: 2 Active Problems ICD-10 Encounter Code Description Active Date Diagnosis S51.801A Unspecified open wound of right forearm, initial encounter 04/15/2017  Yes S51.811A Laceration without foreign body of right forearm, initial 04/15/2017 Yes encounter Inactive Problems Resolved Problems Electronic Signature(s) Signed: 04/29/2017 2:27:19 PM By: Christin Fudge MD, FACS Entered By: Christin Fudge on 04/29/2017 14:27:19 Drew Calderon ([id number]) -------------------------------------------------------------------------------- Progress Note Details Patient Name: Drew Calderon 04/29/2017 1:45 Date of Service: PM Medical Record [id number] Number: Patient Account Number: [id number] Date of Birth/Sex: 1941-07-01 (76 y.o. Male) Treating RN: Deliah Boston, Other Clinician: Primary Care Provider: RUPASHREE Treating Kadyn Chovan, Bo Mcclintock, Provider/Extender: Referring Provider: Jake Samples in Treatment: 2 Subjective Chief Complaint Information obtained from Patient Patient presents to the wound care center with open non-healing surgical wound(s) to the right forearm caused by a complex laceration on 04/06/2017 History of Present Illness (HPI) The following HPI elements were documented for the patient's wound: Location: right lateral forearm just below elbow has a lacerated wound which is status post suturing Quality: Patient reports experiencing a dull pain to affected area(s). Severity: Patient states wound are getting worse. Duration: Patient has had the wound for < 2 weeks prior to presenting for treatment Timing: Pain in wound is Intermittent (comes and goes Context: The wound occurred when the patient had a fall in [place] and was taken to the OR for a complex lacerated suturing Modifying Factors: Other treatment(s) tried include:oral antibiotics and local dressing Associated Signs and Symptoms: Patient reports having increase swelling. 76 year old male who was recently seen at the [facility] emergency medicine department at [facility], with a past medical history of hypertension, with complaints  of right ankle swelling. He had recently returned from [place], [place], where he had recently tripped and fallen and was seen in the hospital where he underwent surgical debridement and laceration repair. He had 60 stitches placed to his right lateral forearm and was started on Keflex. He was concerned about a blood clot in his leg and would like to get evaluated for the same problem. x-rays done earlier showed no fracture. in the ER he was found to have edema of his right ankle and large ecchymosis to the right lateral thigh and hip. x-rays done at: Showed right ankle without acute bone abnormality and right foot showed no acute bone pathology. patient was started on doxycycline and a DVT study was recommended. this was done on 04/12/2017 -- Summary: - No evidence of deep vein or superficial thrombosis involving the right lower extremity and left common femoral vein. - No evidence of Bakerand's cyst on the right. I have reviewed electronic medical records of Dr. Cannon Kettle from [facility] -- instruction on 04/06/2017 recommended sutures and staples to come out in 11 days. x-rays done there showed right elbow 3 views -- no acute displaced fracture, and there were tiny foreign body radiopaque seen adjacent to the olecranon. X-ray right  shoulder no acute fracture. x-ray of the pelvis and right hip -- no fracture. the patient was taken to the OR for closure of the wound. the procedure performed was a layered closure of Drew Calderon, Drew Calderon ([id number]) complex laceration of the skin and soft tissue of the right elbow. Wound was irrigated with pulse irrigation of 5000 units of bacitracin and the patient also received 2 g of Ancef. The wound was debrided, subcutaneous closed with 4-0 Vicryl interrupted sutures. A total of 30 4 interrupted sutures were applied to the skin flaps. past medical history of hemochromatosis currently with phlebotomy 500 mL every 2 months, hypertension, colon polyps,  ex-smoker stopped in 2014. 04/22/2017 -- the right forearm wound has completely necrosed and several nylon sutures are in place Objective Constitutional Pulse regular. Respirations normal and unlabored. Afebrile. Vitals Time Taken: 1:56 PM, Height: 72 in, Weight: 222 lbs, BMI: 30.1, Temperature: 97.7 F, Pulse: 59 bpm, Respiratory Rate: 16 breaths/min, Blood Pressure: 148/71 mmHg. Eyes Nonicteric. Reactive to light. Ears, Nose, Mouth, and Throat Lips, teeth, and gums WNL.Marland Kitchen Moist mucosa without lesions. Neck supple and nontender. No palpable supraclavicular or cervical adenopathy. Normal sized without goiter. Respiratory WNL. No retractions.. Cardiovascular Pedal Pulses WNL. No clubbing, cyanosis or edema. Lymphatic No adneopathy. No adenopathy. No adenopathy. Musculoskeletal Adexa without tenderness or enlargement.. Digits and nails w/o clubbing, cyanosis, infection, petechiae, ischemia, or inflammatory conditions.Marland Kitchen Psychiatric Judgement and insight Intact.. No evidence of depression, anxiety, or agitation.Drew Calderon ([id number]) General Notes: the right forearm wound needed some sharp debridement with a toothed forcep and a subcuticular suture was also removed possibly a vicryl. There is some healthy granulation tissue visible Integumentary (Hair, Skin) No suspicious lesions. No crepitus or fluctuance. No peri-wound warmth or erythema. No masses.. Wound #1 status is Open. Original cause of wound was Trauma. The wound is located on the Right Forearm. The wound measures 2.5cm length x 3.8cm width x 0.1cm depth; 7.461cm^2 area and 0.746cm^3 volume. There is Fat Layer (Subcutaneous Tissue) Exposed exposed. There is no tunneling or undermining noted. There is a medium amount of serous drainage noted. The wound margin is flat and intact. There is no granulation within the wound bed. There is a large (67-100%) amount of necrotic tissue within the wound bed including Eschar and  Adherent Slough. The periwound skin appearance exhibited: Scarring, Erythema. The periwound skin appearance did not exhibit: Callus, Crepitus, Excoriation, Induration, Rash, Dry/Scaly, Maceration, Atrophie Blanche, Cyanosis, Ecchymosis, Hemosiderin Staining, Mottled, Pallor, Rubor. The surrounding wound skin color is noted with erythema which is circumferential. Assessment Active Problems ICD-10 S51.801A - Unspecified open wound of right forearm, initial encounter S51.811A - Laceration without foreign body of right forearm, initial encounter Procedures Wound #1 Pre-procedure diagnosis of Wound #1 is a Trauma, Other located on the Right Forearm . There was a Skin/Subcutaneous Tissue Debridement ([id number]) debridement with total area of 9.5 sq cm performed by Christin Fudge, MD. with the following instrument(s): Forceps to remove Viable and Non-Viable tissue/material including Fibrin/Slough and Subcutaneous after achieving pain control using Other (lidocaine 4%). A time out was conducted at 14:09, prior to the start of the procedure. A Minimum amount of bleeding was controlled with Pressure. The procedure was tolerated well with a pain level of 1 throughout and a pain level of 0 following the procedure. Post Debridement Measurements: 2.5cm length x 3.8cm width x 0.1cm depth; 0.746cm^3 volume. Character of Wound/Ulcer Post Debridement is stable. Post procedure Diagnosis Wound #1: Same as Pre-Procedure Drew Calderon, Drew Calderon ([id number]) Plan  Wound Cleansing: Wound #1 Right Forearm: Clean wound with Normal Saline. Primary Wound Dressing: Wound #1 Right Forearm: Santyl Ointment Secondary Dressing: Wound #1 Right Forearm: ABD and Kerlix/Conform Dressing Change Frequency: Wound #1 Right Forearm: Change dressing every day. Follow-up Appointments: Wound #1 Right Forearm: Return Appointment in 1 week. Medications-please add to medication list.: Wound #1 Right Forearm: Santyl Enzymatic  Ointment further sharp debridement was required today and I have recommended application of Santyl ointment daily with a bordered foam and come to see me back again next week. we also discussed appropriate amount of protein, vitamin A, vitamin C and zinc Electronic Signature(s) Signed: 04/29/2017 2:29:34 PM By: Christin Fudge MD, FACS Entered By: Christin Fudge on 04/29/2017 14:29:34 Drew Calderon ([id number]) -------------------------------------------------------------------------------- SuperBill Details Patient Name: Drew Calderon Date of Service: 04/29/2017 Medical Record Number: [id number] Patient Account Number: [id number] Date of Birth/Sex: [birth year] (76 y.o. Male) Treating RN: Cornell Barman Primary Care Provider: Leeroy Cha Other Clinician: Referring Provider: Leeroy Cha Treating Provider/Extender: Frann Rider in Treatment: 2 Diagnosis Coding ICD-10 Codes Code Description S51.801A Unspecified open wound of right forearm, initial encounter S51.811A Laceration without foreign body of right forearm, initial encounter Facility Procedures CPT4 Code Description: [id number] 11042 - DEB SUBQ TISSUE 20 SQ CM/< ICD-10 Description Diagnosis S51.801A Unspecified open wound of right forearm, initial encou S51.811A Laceration without foreign body of right forearm, init Modifier: nter ial encount Quantity: 1 er Physician Procedures CPT4 Code Description: [id number] - WC PHYS SUBQ TISS 20 SQ CM ICD-10 Description Diagnosis S51.801A Unspecified open wound of right forearm, initial encou S51.811A Laceration without foreign body of right forearm, init Modifier: nter ial encount Quantity: 1 er Engineer, maintenance) Signed: 04/29/2017 2:29:57 PM By: Christin Fudge MD, FACS Previous Signature: 04/29/2017 2:29:44 PM Version By: Christin Fudge MD, FACS Entered By: Christin Fudge on 04/29/2017 14:29:57

## 2017-05-02 DIAGNOSIS — R600 Localized edema: Secondary | ICD-10-CM | POA: Diagnosis not present

## 2017-05-02 DIAGNOSIS — Z23 Encounter for immunization: Secondary | ICD-10-CM | POA: Diagnosis not present

## 2017-05-02 DIAGNOSIS — S51801D Unspecified open wound of right forearm, subsequent encounter: Secondary | ICD-10-CM | POA: Diagnosis not present

## 2017-05-06 ENCOUNTER — Encounter: Payer: Medicare Other | Admitting: Surgery

## 2017-05-06 DIAGNOSIS — S51801A Unspecified open wound of right forearm, initial encounter: Secondary | ICD-10-CM | POA: Diagnosis not present

## 2017-05-06 DIAGNOSIS — S51811A Laceration without foreign body of right forearm, initial encounter: Secondary | ICD-10-CM | POA: Diagnosis not present

## 2017-05-06 DIAGNOSIS — Z86010 Personal history of colonic polyps: Secondary | ICD-10-CM | POA: Diagnosis not present

## 2017-05-06 DIAGNOSIS — I10 Essential (primary) hypertension: Secondary | ICD-10-CM | POA: Diagnosis not present

## 2017-05-08 NOTE — Progress Notes (Signed)
Drew Calderon ([id number]) Visit Report for 05/06/2017 Chief Complaint Document Details Patient Name: Drew Calderon, Drew Calderon 05/06/2017 11:00 Date of Service: AM Medical Record [id number] Number: Patient Account Number: [id number] Date of Birth/Sex: [birth year] (76 y.o. Male) Treating RN: Deliah Boston, Other Clinician: Primary Care Provider: RUPASHREE Treating Caral Whan, Bo Mcclintock, Provider/Extender: Referring Provider: Jake Samples in Treatment: 3 Information Obtained from: Patient Chief Complaint Patient presents to the wound care center with open non-healing surgical wound(s) to the right forearm caused by a complex laceration on 04/06/2017 Electronic Signature(s) Signed: 05/06/2017 11:20:43 AM By: Christin Fudge MD, FACS Entered By: Christin Fudge on 05/06/2017 11:20:42 Drew Calderon ([id number]) -------------------------------------------------------------------------------- Debridement Details Patient Name: Drew Calderon. 05/06/2017 11:00 Date of Service: AM Medical Record [id number] Number: Patient Account Number: [id number] Date of Birth/Sex: November 27, 1940 (76 y.o. Male) Treating RN: Deliah Boston, Other Clinician: Primary Care Provider: RUPASHREE Treating Daemien Fronczak, Bo Mcclintock, Provider/Extender: Referring Provider: Jake Samples in Treatment: 3 Debridement Performed for Wound #1 Right Forearm Assessment: Performed By: Physician Christin Fudge, MD Debridement: Debridement Pre-procedure Verification/Time Out Yes - 11:14 Taken: Start Time: 11:15 Pain Control: Other : lidocaine 4% Level: Skin/Subcutaneous Tissue Total Area Debrided (L x 1.5 (cm) x 3.2 (cm) = 4.8 (cm) W): Tissue and other Viable, Eschar, Exudate, Fibrin/Slough, Subcutaneous material debrided: Instrument: Forceps Bleeding: Minimum Hemostasis Achieved: Pressure End Time: 11:15 Procedural Pain: 0 Post Procedural Pain: 0 Response to Treatment:  Procedure was tolerated well Post Debridement Measurements of Total Wound Length: (cm) 1.5 Width: (cm) 3.2 Depth: (cm) 0.1 Volume: (cm) 0.377 Character of Wound/Ulcer Post Improved Debridement: Post Procedure Diagnosis Same as Pre-procedure Electronic Signature(s) Signed: 05/06/2017 11:20:34 AM By: Christin Fudge MD, FACS Signed: 05/07/2017 5:44:12 PM By: Gretta Cool, BSN, RN, CWS, Kim RN, BSN Previous Signature: 05/06/2017 11:19:59 AM Version By: Christin Fudge MD, FACS Drew Calderon ([id number]) Entered By: Christin Fudge on 05/06/2017 11:20:34 Drew Calderon ([id number]) -------------------------------------------------------------------------------- HPI Details Patient Name: Drew Calderon 05/06/2017 11:00 Date of Service: AM Medical Record [id number] Number: Patient Account Number: [id number] Date of Birth/Sex: October 27, 1940 (76 y.o. Male) Treating RN: Deliah Boston, Other Clinician: Primary Care Provider: RUPASHREE Treating Lilit Cinelli, Bo Mcclintock, Provider/Extender: Referring Provider: Jake Samples in Treatment: 3 History of Present Illness Location: right lateral forearm just below elbow has a lacerated wound which is status post suturing Quality: Patient reports experiencing a dull pain to affected area(s). Severity: Patient states wound are getting worse. Duration: Patient has had the wound for < 2 weeks prior to presenting for treatment Timing: Pain in wound is Intermittent (comes and goes Context: The wound occurred when the patient had a fall in [place] and was taken to the OR for a complex lacerated suturing Modifying Factors: Other treatment(s) tried include:oral antibiotics and local dressing Associated Signs and Symptoms: Patient reports having increase swelling. HPI Description: 76 year old male who was recently seen at the [facility] emergency medicine department at [facility], with a past medical history of hypertension,  with complaints of right ankle swelling. He had recently returned from [place], [place], where he had recently tripped and fallen and was seen in the hospital where he underwent surgical debridement and laceration repair. He had 60 stitches placed to his right lateral forearm and was started on Keflex. He was concerned about a blood clot in his leg and would like to get evaluated for the same problem. x-rays done earlier showed no fracture. in the ER he was found to have edema of his right ankle and large  ecchymosis to the right lateral thigh and hip. x-rays done at: Showed right ankle without acute bone abnormality and right foot showed no acute bone pathology. patient was started on doxycycline and a DVT study was recommended. this was done on 04/12/2017 -- Summary: - No evidence of deep vein or superficial thrombosis involving the right lower extremity and left common femoral vein. - No evidence of Bakerand's cyst on the right. I have reviewed electronic medical records of Dr. Cannon Kettle from [facility] -- instruction on 04/06/2017 recommended sutures and staples to come out in 11 days. x-rays done there showed right elbow 3 views -- no acute displaced fracture, and there were tiny foreign body radiopaque seen adjacent to the olecranon. X-ray right shoulder no acute fracture. x-ray of the pelvis and right hip -- no fracture. the patient was taken to the OR for closure of the wound. the procedure performed was a layered closure of complex laceration of the skin and soft tissue of the right elbow. Wound was irrigated with pulse irrigation of 5000 units of bacitracin and the patient also received 2 g of Ancef. The wound was debrided, subcutaneous closed with 4-0 Vicryl interrupted sutures. A total of 30 4 interrupted sutures were applied to the skin flaps. past medical history of hemochromatosis currently with phlebotomy 500 mL every 2 months, hypertension, colon polyps, ex-smoker stopped in  2014. 04/22/2017 -- the right forearm wound has completely necrosed and several nylon sutures are in place Drew Calderon, Drew Calderon ([id number]) Electronic Signature(s) Signed: 05/06/2017 11:20:49 AM By: Christin Fudge MD, FACS Entered By: Christin Fudge on 05/06/2017 11:20:48 Drew Calderon ([id number]) -------------------------------------------------------------------------------- Physical Exam Details Patient Name: Drew Calderon 05/06/2017 11:00 Date of Service: AM Medical Record [id number] Number: Patient Account Number: [id number] Date of Birth/Sex: 04/06/1941 (76 y.o. Male) Treating RN: Deliah Boston, Other Clinician: Primary Care Provider: RUPASHREE Treating Marisela Line, Bo Mcclintock, Provider/Extender: Referring Provider: Jake Samples in Treatment: 3 Constitutional . Pulse regular. Respirations normal and unlabored. Afebrile. . Eyes Nonicteric. Reactive to light. Ears, Nose, Mouth, and Throat Lips, teeth, and gums WNL.Marland Kitchen Moist mucosa without lesions. Neck supple and nontender. No palpable supraclavicular or cervical adenopathy. Normal sized without goiter. Respiratory WNL. No retractions.. Cardiovascular Pedal Pulses WNL. No clubbing, cyanosis or edema. Lymphatic No adneopathy. No adenopathy. No adenopathy. Musculoskeletal Adexa without tenderness or enlargement.. Digits and nails w/o clubbing, cyanosis, infection, petechiae, ischemia, or inflammatory conditions.. Integumentary (Hair, Skin) No suspicious lesions. No crepitus or fluctuance. No peri-wound warmth or erythema. No masses.Marland Kitchen Psychiatric Judgement and insight Intact.. No evidence of depression, anxiety, or agitation.. Notes the right forearm wound needed some sharp debridement with a forcep and some of the subcutaneous debris and a few loose Vicryl sutures were removed sharply Electronic Signature(s) Signed: 05/06/2017 11:21:37 AM By: Christin Fudge MD, FACS Entered By: Christin Fudge on  05/06/2017 11:21:36 Drew Calderon ([id number]) -------------------------------------------------------------------------------- Physician Orders Details Patient Name: Drew Calderon, Drew Calderon 05/06/2017 11:00 Date of Service: AM Medical Record [id number] Number: Patient Account Number: [id number] Date of Birth/Sex: 1941-06-11 (76 y.o. Male) Treating RN: Deliah Boston, Other Clinician: Primary Care Provider: RUPASHREE Treating Fatisha Rabalais, Bo Mcclintock, Provider/Extender: Referring Provider: Jake Samples in Treatment: 3 Verbal / Phone Orders: No Diagnosis Coding ICD-10 Coding Code Description O67.124P Unspecified open wound of right forearm, initial encounter S51.811A Laceration without foreign body of right forearm, initial encounter Wound Cleansing Wound #1 Right Forearm o Clean wound with Normal Saline. Primary Wound Dressing Wound #1 Right Forearm o Santyl Ointment Secondary Dressing Wound #  1 Right Forearm o ABD and Kerlix/Conform Dressing Change Frequency Wound #1 Right Forearm o Change dressing every day. Follow-up Appointments Wound #1 Right Forearm o Return Appointment in 1 week. Medications-please add to medication list. Wound #1 Right Forearm o Santyl Enzymatic Ointment Electronic Signature(s) Signed: 05/06/2017 4:27:05 PM By: Christin Fudge MD, FACS Drew Calderon ([id number]) Signed: 05/07/2017 5:44:12 PM By: Gretta Cool, BSN, RN, CWS, Kim RN, BSN Entered By: Gretta Cool, BSN, RN, CWS, Kim on 05/06/2017 11:21:56 Drew Calderon ([id number]) -------------------------------------------------------------------------------- Problem List Details Patient Name: Drew Calderon, Drew Calderon 05/06/2017 11:00 Date of Service: AM Medical Record [id number] Number: Patient Account Number: [id number] Date of Birth/Sex: 07-26-1941 (76 y.o. Male) Treating RN: Deliah Boston, Other Clinician: Primary Care Provider: RUPASHREE Treating Vivian Okelley,  Bo Mcclintock, Provider/Extender: Referring Provider: Jake Samples in Treatment: 3 Active Problems ICD-10 Encounter Code Description Active Date Diagnosis S51.801A Unspecified open wound of right forearm, initial encounter 04/15/2017 Yes S51.811A Laceration without foreign body of right forearm, initial 04/15/2017 Yes encounter Inactive Problems Resolved Problems Electronic Signature(s) Signed: 05/06/2017 11:19:44 AM By: Christin Fudge MD, FACS Entered By: Christin Fudge on 05/06/2017 11:19:43 Drew Calderon ([id number]) -------------------------------------------------------------------------------- Progress Note Details Patient Name: Drew Calderon. 05/06/2017 11:00 Date of Service: AM Medical Record [id number] Number: Patient Account Number: [id number] Date of Birth/Sex: August 27, 1940 (76 y.o. Male) Treating RN: Deliah Boston, Other Clinician: Primary Care Provider: RUPASHREE Treating Rosan Calbert, Bo Mcclintock, Provider/Extender: Referring Provider: Jake Samples in Treatment: 3 Subjective Chief Complaint Information obtained from Patient Patient presents to the wound care center with open non-healing surgical wound(s) to the right forearm caused by a complex laceration on 04/06/2017 History of Present Illness (HPI) The following HPI elements were documented for the patient's wound: Location: right lateral forearm just below elbow has a lacerated wound which is status post suturing Quality: Patient reports experiencing a dull pain to affected area(s). Severity: Patient states wound are getting worse. Duration: Patient has had the wound for < 2 weeks prior to presenting for treatment Timing: Pain in wound is Intermittent (comes and goes Context: The wound occurred when the patient had a fall in [place] and was taken to the OR for a complex lacerated suturing Modifying Factors: Other treatment(s) tried include:oral antibiotics and local  dressing Associated Signs and Symptoms: Patient reports having increase swelling. 76 year old male who was recently seen at the [facility] emergency medicine department at [facility], with a past medical history of hypertension, with complaints of right ankle swelling. He had recently returned from [place], [place], where he had recently tripped and fallen and was seen in the hospital where he underwent surgical debridement and laceration repair. He had 60 stitches placed to his right lateral forearm and was started on Keflex. He was concerned about a blood clot in his leg and would like to get evaluated for the same problem. x-rays done earlier showed no fracture. in the ER he was found to have edema of his right ankle and large ecchymosis to the right lateral thigh and hip. x-rays done at: Showed right ankle without acute bone abnormality and right foot showed no acute bone pathology. patient was started on doxycycline and a DVT study was recommended. this was done on 04/12/2017 -- Summary: - No evidence of deep vein or superficial thrombosis involving the right lower extremity and left common femoral vein. - No evidence of Bakerand's cyst on the right. I have reviewed electronic medical records of Dr. Cannon Kettle from [facility] -- instruction on 04/06/2017 recommended  sutures and staples to come out in 11 days. x-rays done there showed right elbow 3 views -- no acute displaced fracture, and there were tiny foreign body radiopaque seen adjacent to the olecranon. X-ray right shoulder no acute fracture. x-ray of the pelvis and right hip -- no fracture. the patient was taken to the OR for closure of the wound. the procedure performed was a layered closure of Drew Calderon, Drew Calderon ([id number]) complex laceration of the skin and soft tissue of the right elbow. Wound was irrigated with pulse irrigation of 5000 units of bacitracin and the patient also received 2 g of Ancef. The wound was  debrided, subcutaneous closed with 4-0 Vicryl interrupted sutures. A total of 30 4 interrupted sutures were applied to the skin flaps. past medical history of hemochromatosis currently with phlebotomy 500 mL every 2 months, hypertension, colon polyps, ex-smoker stopped in 2014. 04/22/2017 -- the right forearm wound has completely necrosed and several nylon sutures are in place Objective Constitutional Pulse regular. Respirations normal and unlabored. Afebrile. Vitals Time Taken: 11:02 AM, Height: 72 in, Weight: 222 lbs, BMI: 30.1, Temperature: 97.8 F, Pulse: 68 bpm, Respiratory Rate: 16 breaths/min, Blood Pressure: 135/68 mmHg. Eyes Nonicteric. Reactive to light. Ears, Nose, Mouth, and Throat Lips, teeth, and gums WNL.Marland Kitchen Moist mucosa without lesions. Neck supple and nontender. No palpable supraclavicular or cervical adenopathy. Normal sized without goiter. Respiratory WNL. No retractions.. Cardiovascular Pedal Pulses WNL. No clubbing, cyanosis or edema. Lymphatic No adneopathy. No adenopathy. No adenopathy. Musculoskeletal Adexa without tenderness or enlargement.. Digits and nails w/o clubbing, cyanosis, infection, petechiae, ischemia, or inflammatory conditions.Marland Kitchen Psychiatric Judgement and insight Intact.. No evidence of depression, anxiety, or agitation.Drew Calderon ([id number]) General Notes: the right forearm wound needed some sharp debridement with a forcep and some of the subcutaneous debris and a few loose Vicryl sutures were removed sharply Integumentary (Hair, Skin) No suspicious lesions. No crepitus or fluctuance. No peri-wound warmth or erythema. No masses.. Wound #1 status is Open. Original cause of wound was Trauma. The wound is located on the Right Forearm. The wound measures 1.5cm length x 3.2cm width x 0.1cm depth; 3.77cm^2 area and 0.377cm^3 volume. There is Fat Layer (Subcutaneous Tissue) Exposed exposed. There is no tunneling or undermining noted. There  is a medium amount of serous drainage noted. The wound margin is flat and intact. There is large (67-100%) red, hyper - granulation within the wound bed. There is a small (1-33%) amount of necrotic tissue within the wound bed including Adherent Slough. The periwound skin appearance exhibited: Induration, Scarring, Erythema. The periwound skin appearance did not exhibit: Callus, Crepitus, Excoriation, Rash, Dry/Scaly, Maceration, Atrophie Blanche, Cyanosis, Ecchymosis, Hemosiderin Staining, Mottled, Pallor, Rubor. The surrounding wound skin color is noted with erythema which is circumferential. Assessment Active Problems ICD-10 S51.801A - Unspecified open wound of right forearm, initial encounter S51.811A - Laceration without foreign body of right forearm, initial encounter Procedures Wound #1 Pre-procedure diagnosis of Wound #1 is a Trauma, Other located on the Right Forearm . There was a Skin/Subcutaneous Tissue Debridement ([id number]) debridement with total area of 4.8 sq cm performed by Christin Fudge, MD. with the following instrument(s): Forceps to remove Viable tissue/material including Exudate, Fibrin/Slough, Eschar, and Subcutaneous after achieving pain control using Other (lidocaine 4%). A time out was conducted at 11:14, prior to the start of the procedure. A Minimum amount of bleeding was controlled with Pressure. The procedure was tolerated well with a pain level of 0 throughout and a pain level of 0 following  the procedure. Post Debridement Measurements: 1.5cm length x 3.2cm width x 0.1cm depth; 0.377cm^3 volume. Character of Wound/Ulcer Post Debridement is improved. Post procedure Diagnosis Wound #1: Same as Pre-Procedure Drew Calderon, Drew Calderon ([id number]) Plan he required further sharp debridement today and I have recommended application of Santyl ointment daily with a bordered foam and come to see me back again next week. we also discussed appropriate amount of protein,  vitamin A, vitamin C and zinc Electronic Signature(s) Signed: 05/06/2017 11:22:24 AM By: Christin Fudge MD, FACS Entered By: Christin Fudge on 05/06/2017 11:22:23 Drew Calderon ([id number]) -------------------------------------------------------------------------------- SuperBill Details Patient Name: Drew Calderon Date of Service: 05/06/2017 Medical Record Number: [id number] Patient Account Number: [id number] Date of Birth/Sex: 1940-10-20 (76 y.o. Male) Treating RN: Cornell Barman Primary Care Provider: Leeroy Cha Other Clinician: Referring Provider: Leeroy Cha Treating Provider/Extender: Frann Rider in Treatment: 3 Diagnosis Coding ICD-10 Codes Code Description S51.801A Unspecified open wound of right forearm, initial encounter S51.811A Laceration without foreign body of right forearm, initial encounter Facility Procedures CPT4 Code Description: [id number] 11042 - DEB SUBQ TISSUE 20 SQ CM/< ICD-10 Description Diagnosis S51.801A Unspecified open wound of right forearm, initial encou S51.811A Laceration without foreign body of right forearm, init Modifier: nter ial encount Quantity: 1 er Physician Procedures CPT4 Code Description: [id number] - WC PHYS SUBQ TISS 20 SQ CM ICD-10 Description Diagnosis S51.801A Unspecified open wound of right forearm, initial encou S51.811A Laceration without foreign body of right forearm, init Modifier: nter ial encount Quantity: 1 er Electronic Signature(s) Signed: 05/06/2017 11:23:41 AM By: Christin Fudge MD, FACS Previous Signature: 05/06/2017 11:22:32 AM Version By: Christin Fudge MD, FACS Entered By: Christin Fudge on 05/06/2017 11:23:41

## 2017-05-08 NOTE — Progress Notes (Signed)
Drew Calderon, Drew Calderon ([id number]) Visit Report for 05/06/2017 Arrival Information Details Patient Name: Drew Calderon, Drew Calderon 05/06/2017 11:00 Date of Service: AM Medical Record [id number] Number: Patient Account Number: [id number] Date of Birth/Sex: [birth year] (76 y.o. Male) Treating RN: Deliah Boston, Other Clinician: Primary Care Judyann Casasola: RUPASHREE Treating Britto, Bo Mcclintock, Mayu Ronk/Extender: Referring Gary Bultman: Jake Samples in Treatment: 3 Visit Information History Since Last Visit Added or deleted any medications: No Patient Arrived: Ambulatory Any new allergies or adverse reactions: No Arrival Time: 11:01 Had a fall or experienced change in No Accompanied By: wife activities of daily living that may affect Transfer Assistance: None risk of falls: Patient Identification Verified: Yes Signs or symptoms of abuse/neglect since last No Secondary Verification Process Yes visito Completed: Hospitalized since last visit: No Patient Requires Transmission- No Has Dressing in Place as Prescribed: Yes Based Precautions: Pain Present Now: No Patient Has Alerts: Yes Patient Alerts: Patient on Blood Thinner 81mg  aspirin Electronic Signature(s) Signed: 05/07/2017 5:44:12 PM By: Gretta Cool, BSN, RN, CWS, Kim RN, BSN Entered By: Gretta Cool, BSN, RN, CWS, Kim on 05/06/2017 11:01:43 Drew Calderon ([id number]) -------------------------------------------------------------------------------- Encounter Discharge Information Details Patient Name: Drew Calderon, Drew Calderon 05/06/2017 11:00 Date of Service: AM Medical Record [id number] Number: Patient Account Number: [id number] Date of Birth/Sex: 1940/11/28 (76 y.o. Male) Treating RN: Deliah Boston, Other Clinician: Primary Care Annalucia Laino: RUPASHREE Treating Britto, Bo Mcclintock, Janila Arrazola/Extender: Referring Michall Noffke: Jake Samples in Treatment: 3 Encounter Discharge Information Items Discharge Pain  Level: 0 Discharge Condition: Stable Ambulatory Status: Ambulatory Discharge Destination: Home Transportation: Private Auto Accompanied By: wife Schedule Follow-up Appointment: Yes Medication Reconciliation completed and provided to Patient/Care Yes Noam Karaffa: Provided on Clinical Summary of Care: 05/06/2017 Form Type Recipient Paper Patient WS Electronic Signature(s) Signed: 05/07/2017 5:44:12 PM By: Gretta Cool, BSN, RN, CWS, Kim RN, BSN Entered By: Gretta Cool, BSN, RN, CWS, Kim on 05/06/2017 11:22:52 Drew Calderon ([id number]) -------------------------------------------------------------------------------- Lower Extremity Assessment Details Patient Name: Drew Calderon, Drew Calderon 05/06/2017 11:00 Date of Service: AM Medical Record [id number] Number: Patient Account Number: [id number] Date of Birth/Sex: 1941/02/19 (76 y.o. Male) Treating RN: Deliah Boston, Other Clinician: Primary Care Dominica Kent: RUPASHREE Treating Britto, Bo Mcclintock, Donoven Pett/Extender: Referring Tauheed Mcfayden: Jake Samples in Treatment: 3 Electronic Signature(s) Signed: 05/07/2017 5:44:12 PM By: Gretta Cool, BSN, RN, CWS, Kim RN, BSN Entered By: Gretta Cool, BSN, RN, CWS, Kim on 05/06/2017 11:14:10 Drew Calderon ([id number]) -------------------------------------------------------------------------------- Multi Wound Chart Details Patient Name: Drew Calderon. 05/06/2017 11:00 Date of Service: AM Medical Record [id number] Number: Patient Account Number: [id number] Date of Birth/Sex: March 28, 1941 (76 y.o. Male) Treating RN: Deliah Boston, Other Clinician: Primary Care Myleen Brailsford: RUPASHREE Treating Britto, Bo Mcclintock, Yosiah Jasmin/Extender: Referring Alp Goldwater: Jake Samples in Treatment: 3 Vital Signs Height(in): 72 Pulse(bpm): 68 Weight(lbs): 222 Blood Pressure 135/68 (mmHg): Body Mass Index(BMI): 30 Temperature(F): 97.8 Respiratory Rate 16 (breaths/min): Photos:  [N/A:N/A] Wound Location: Right Forearm N/A N/A Wounding Event: Trauma N/A N/A Primary Etiology: Trauma, Other N/A N/A Date Acquired: 04/06/2017 N/A N/A Weeks of Treatment: 3 N/A N/A Wound Status: Open N/A N/A Measurements L x W x D 1.5x3.2x0.1 N/A N/A (cm) Area (cm) : 3.77 N/A N/A Volume (cm) : 0.377 N/A N/A % Reduction in Area: 82.90% N/A N/A % Reduction in Volume: 82.90% N/A N/A Classification: Partial Thickness N/A N/A Exudate Amount: Medium N/A N/A Exudate Type: Serous N/A N/A Exudate Color: amber N/A N/A Wound Margin: Flat and Intact N/A N/A Granulation Amount: Large (67-100%) N/A N/A Granulation Quality: Red, Hyper-granulation N/A N/A Necrotic Amount: Small (1-33%) N/A N/A Exposed Structures: N/A N/A  Drew Calderon, Drew Calderon ([id number]) Fat Layer (Subcutaneous Tissue) Exposed: Yes Fascia: No Tendon: No Muscle: No Joint: No Bone: No Epithelialization: Small (1-33%) N/A N/A Debridement: Open Wound/Selective N/A N/A ([id number]) - Selective Pre-procedure 11:14 N/A N/A Verification/Time Out Taken: Pain Control: Other N/A N/A Tissue Debrided: Necrotic/Eschar, Exudates N/A N/A Level: Non-Viable Tissue N/A N/A Debridement Area (sq 4.8 N/A N/A cm): Instrument: Forceps N/A N/A Bleeding: Minimum N/A N/A Hemostasis Achieved: Pressure N/A N/A Procedural Pain: 0 N/A N/A Post Procedural Pain: 0 N/A N/A Debridement Treatment Procedure was tolerated N/A N/A Response: well Post Debridement 1.5x3.2x0.1 N/A N/A Measurements L x W x D (cm) Post Debridement 0.377 N/A N/A Volume: (cm) Periwound Skin Texture: Induration: Yes N/A N/A Scarring: Yes Excoriation: No Callus: No Crepitus: No Rash: No Periwound Skin Maceration: No N/A N/A Moisture: Dry/Scaly: No Periwound Skin Color: Erythema: Yes N/A N/A Atrophie Blanche: No Cyanosis: No Ecchymosis: No Hemosiderin Staining: No Mottled: No Pallor: No Rubor: No Erythema Location: Circumferential N/A N/A Tenderness on  No N/A N/A Palpation: Wound Preparation: N/A N/A Drew Calderon ([id number]) Ulcer Cleansing: Rinsed/Irrigated with Saline Topical Anesthetic Applied: Other: lidocaine 4% Procedures Performed: Debridement N/A N/A Treatment Notes Electronic Signature(s) Signed: 05/06/2017 11:19:49 AM By: Christin Fudge MD, FACS Entered By: Christin Fudge on 05/06/2017 11:19:49 Drew Calderon ([id number]) -------------------------------------------------------------------------------- [place] Details Patient Name: Drew Calderon, Drew Calderon 05/06/2017 11:00 Date of Service: AM Medical Record [id number] Number: Patient Account Number: [id number] Date of Birth/Sex: 15-Oct-1940 (76 y.o. Male) Treating RN: Deliah Boston, Other Clinician: Primary Care Karsen Fellows: RUPASHREE Treating Britto, Bo Mcclintock, Denna Fryberger/Extender: Referring Latarra Eagleton: Jake Samples in Treatment: 3 Active Inactive ` Abuse / Safety / Falls / Self Care Management Nursing Diagnoses: History of Falls Potential for falls Goals: Patient will remain injury free related to falls Date Initiated: 04/15/2017 Target Resolution Date: 04/26/2017 Goal Status: Active Interventions: Assess fall risk on admission and as needed Notes: ` Orientation to the Wound Care Program Nursing Diagnoses: Knowledge deficit related to the wound healing center program Goals: Patient/caregiver will verbalize understanding of the [facility] Program Date Initiated: 04/15/2017 Target Resolution Date: 04/26/2017 Goal Status: Active Interventions: Provide education on orientation to the wound center Notes: ` Wound/Skin Impairment Drew Calderon, Drew Calderon ([id number]) Nursing Diagnoses: Impaired tissue integrity Goals: Ulcer/skin breakdown will heal within 14 weeks Date Initiated: 04/15/2017 Target Resolution Date: 07/29/2017 Goal Status: Active Interventions: Assess patient/caregiver ability to obtain  necessary supplies Treatment Activities: Skin care regimen initiated : 04/15/2017 Topical wound management initiated : 04/15/2017 Notes: Electronic Signature(s) Signed: 05/07/2017 5:44:12 PM By: Gretta Cool, BSN, RN, CWS, Kim RN, BSN Entered By: Gretta Cool, BSN, RN, CWS, Kim on 05/06/2017 11:14:38 Drew Calderon ([id number]) -------------------------------------------------------------------------------- Pain Assessment Details Patient Name: Drew Calderon. 05/06/2017 11:00 Date of Service: AM Medical Record [id number] Number: Patient Account Number: [id number] Date of Birth/Sex: 04/30/1941 (76 y.o. Male) Treating RN: Deliah Boston, Other Clinician: Primary Care Yan Okray: RUPASHREE Treating Britto, Bo Mcclintock, Aubry Tucholski/Extender: Referring Emmilee Reamer: Jake Samples in Treatment: 3 Active Problems Location of Pain Severity and Description of Pain Patient Has Paino No Site Locations With Dressing Change: No Pain Management and Medication Current Pain Management: Goals for Pain Management Topical or injectable lidocaine is offered to patient for acute pain when surgical debridement is performed. If needed, Patient is instructed to use over the counter pain medication for the following 24-48 hours after debridement. Wound care MDs do not prescribed pain medications. Patient has chronic pain or uncontrolled pain. Patient has been instructed to make an appointment  with their Primary Care Physician for pain management. Electronic Signature(s) Signed: 05/07/2017 5:44:12 PM By: Gretta Cool, BSN, RN, CWS, Kim RN, BSN Entered By: Gretta Cool, BSN, RN, CWS, Kim on 05/06/2017 11:02:01 Drew Calderon ([id number]) -------------------------------------------------------------------------------- Patient/Caregiver Education Details Patient Name: Drew Calderon, Drew Calderon 05/06/2017 11:00 Date of Service: AM Medical Record [id number] Number: Patient Account Number: [id number] Date of  Birth/Gender: Jan 06, 1941 (76 y.o. Male) Treating RN: Cornell Barman Spivey, Other Clinician: Physician: RUPASHREE Treating Britto, Bo Mcclintock, Physician/Extender: Referring Physician: Jake Samples in Treatment: 3 Education Assessment Education Provided To: Patient Education Topics Provided Wound/Skin Impairment: Handouts: Caring for Your Ulcer, Other: continue wound care as prescribed Methods: Demonstration, Explain/Verbal Responses: State content correctly Electronic Signature(s) Signed: 05/07/2017 5:44:12 PM By: Gretta Cool, BSN, RN, CWS, Kim RN, BSN Entered By: Gretta Cool, BSN, RN, CWS, Kim on 05/06/2017 11:23:16 Drew Calderon ([id number]) -------------------------------------------------------------------------------- Wound Assessment Details Patient Name: Drew Calderon, Drew Calderon 05/06/2017 11:00 Date of Service: AM Medical Record [id number] Number: Patient Account Number: [id number] Date of Birth/Sex: October 05, 1940 (76 y.o. Male) Treating RN: Deliah Boston, Other Clinician: Primary Care Daveda Larock: RUPASHREE Treating Britto, Bo Mcclintock, Denya Buckingham/Extender: Referring Gizella [place]: Jake Samples in Treatment: 3 Wound Status Wound Number: 1 Primary Etiology: Trauma, Other Wound Location: Right Forearm Wound Status: Open Wounding Event: Trauma Date Acquired: 04/06/2017 Weeks Of Treatment: 3 Clustered Wound: No Photos Wound Measurements Length: (cm) 1.5 Width: (cm) 3.2 Depth: (cm) 0.1 Area: (cm) 3.77 Volume: (cm) 0.377 % Reduction in Area: 82.9% % Reduction in Volume: 82.9% Epithelialization: Small (1-33%) Tunneling: No Undermining: No Wound Description Classification: Partial Thickness Foul Odor After Wound Margin: Flat and Intact Slough/Fibrino Exudate Amount: Medium Exudate Type: Serous Exudate Color: amber Cleansing: No Yes Wound Bed Granulation Amount: Large (67-100%) Exposed Structure Granulation Quality: Red,  Hyper-granulation Fascia Exposed: No Necrotic Amount: Small (1-33%) Fat Layer (Subcutaneous Tissue) Exposed: Yes Necrotic Quality: Adherent Slough Tendon Exposed: No Muscle Exposed: No Drew Calderon, Drew Calderon ([id number]) Joint Exposed: No Bone Exposed: No Periwound Skin Texture Texture Color No Abnormalities Noted: No No Abnormalities Noted: No Callus: No Atrophie Blanche: No Crepitus: No Cyanosis: No Excoriation: No Ecchymosis: No Induration: Yes Erythema: Yes Rash: No Erythema Location: Circumferential Scarring: Yes Hemosiderin Staining: No Mottled: No Moisture Pallor: No No Abnormalities Noted: No Rubor: No Dry / Scaly: No Maceration: No Wound Preparation Ulcer Cleansing: Rinsed/Irrigated with Saline Topical Anesthetic Applied: Other: lidocaine 4%, Treatment Notes Wound #1 (Right Forearm) 1. Cleansed with: Clean wound with Normal Saline 2. Anesthetic Topical Lidocaine 4% cream to wound bed prior to debridement 4. Dressing Applied: Santyl Ointment 5. Secondary Dressing Applied Non-Adherent pad Notes conform and stretch net. Electronic Signature(s) Signed: 05/07/2017 5:44:12 PM By: Gretta Cool, BSN, RN, CWS, Kim RN, BSN Entered By: Gretta Cool, BSN, RN, CWS, Kim on 05/06/2017 11:06:16 Drew Calderon ([id number]) -------------------------------------------------------------------------------- [place] Details Patient Name: Drew Calderon, Drew Calderon 05/06/2017 11:00 Date of Service: AM Medical Record [id number] Number: Patient Account Number: [id number] Date of Birth/Sex: Feb 23, 1941 (76 y.o. Male) Treating RN: Deliah Boston, Other Clinician: Primary Care Zai Chmiel: RUPASHREE Treating Britto, Bo Mcclintock, Val Schiavo/Extender: Referring Blanche Scovell: Jake Samples in Treatment: 3 Vital Signs Time Taken: 11:02 Temperature (F): 97.8 Height (in): 72 Pulse (bpm): 68 Weight (lbs): 222 Respiratory Rate (breaths/min): 16 Body Mass Index (BMI): 30.1 Blood  Pressure (mmHg): 135/68 Reference Range: 80 - 120 mg / dl Electronic Signature(s) Signed: 05/07/2017 5:44:12 PM By: Gretta Cool, BSN, RN, CWS, Kim RN, BSN Entered By: Gretta Cool, BSN, RN, CWS, Kim on 05/06/2017 11:02:29

## 2017-05-13 ENCOUNTER — Ambulatory Visit: Payer: Medicare Other | Admitting: Surgery

## 2017-05-16 ENCOUNTER — Encounter: Payer: Medicare Other | Admitting: Surgery

## 2017-05-16 DIAGNOSIS — S51811A Laceration without foreign body of right forearm, initial encounter: Secondary | ICD-10-CM | POA: Diagnosis not present

## 2017-05-16 DIAGNOSIS — S51801A Unspecified open wound of right forearm, initial encounter: Secondary | ICD-10-CM | POA: Diagnosis not present

## 2017-05-16 DIAGNOSIS — I10 Essential (primary) hypertension: Secondary | ICD-10-CM | POA: Diagnosis not present

## 2017-05-16 DIAGNOSIS — Z86010 Personal history of colonic polyps: Secondary | ICD-10-CM | POA: Diagnosis not present

## 2017-05-20 NOTE — Progress Notes (Signed)
Drew Calderon, Drew Calderon ([id number]) Visit Report for 05/16/2017 Arrival Information Details Patient Name: Drew Calderon, Drew Calderon 05/16/2017 9:15 Date of Service: AM Medical Record [id number] Number: Patient Account Number: [id number] Date of Birth/Sex: 02-Dec-1940 (76 y.o. Male) Treating RN: Deliah Boston, Other Clinician: Primary Care Gema Ringold: RUPASHREE Treating Britto, Bo Mcclintock, Chonte Ricke/Extender: Referring Allona Gondek: Jake Samples in Treatment: 4 Visit Information History Since Last Visit Added or deleted any medications: No Patient Arrived: Ambulatory Any new allergies or adverse reactions: No Arrival Time: 09:25 Had a fall or experienced change in No Accompanied By: wife activities of daily living that may affect Transfer Assistance: None risk of falls: Patient Identification Verified: Yes Signs or symptoms of abuse/neglect since last No Secondary Verification Process Yes visito Completed: Hospitalized since last visit: No Patient Requires Transmission- No Has Dressing in Place as Prescribed: Yes Based Precautions: Pain Present Now: No Patient Has Alerts: Yes Patient Alerts: Patient on Blood Thinner 81mg  aspirin Electronic Signature(s) Signed: 05/16/2017 5:08:24 PM By: Gretta Cool, BSN, RN, CWS, Kim RN, BSN Entered By: Gretta Cool, BSN, RN, CWS, Kim on 05/16/2017 09:26:32 Drew Calderon ([id number]) -------------------------------------------------------------------------------- Clinic Level of Care Assessment Details Patient Name: Drew Calderon, Drew Calderon 05/16/2017 9:15 Date of Service: AM Medical Record [id number] Number: Patient Account Number: [id number] Date of Birth/Sex: 05/17/1941 (76 y.o. Male) Treating RN: Deliah Boston, Other Clinician: Primary Care Babyboy Loya: RUPASHREE Treating Britto, Bo Mcclintock, Kischa Altice/Extender: Referring Emeli Goguen: Jake Samples in Treatment: 4 Clinic Level of Care Assessment Items TOOL 4 Quantity  Score []  - Use when only an EandM is performed on FOLLOW-UP visit 0 ASSESSMENTS - Nursing Assessment / Reassessment []  - Reassessment of Co-morbidities (includes updates in patient status) 0 X - Reassessment of Adherence to Treatment Plan 1 5 ASSESSMENTS - Wound and Skin Assessment / Reassessment X - Simple Wound Assessment / Reassessment - one wound 1 5 []  - Complex Wound Assessment / Reassessment - multiple wounds 0 []  - Dermatologic / Skin Assessment (not related to wound area) 0 ASSESSMENTS - Focused Assessment []  - Circumferential Edema Measurements - multi extremities 0 []  - Nutritional Assessment / Counseling / Intervention 0 []  - Lower Extremity Assessment (monofilament, tuning fork, pulses) 0 []  - Peripheral Arterial Disease Assessment (using hand held doppler) 0 ASSESSMENTS - Ostomy and/or Continence Assessment and Care []  - Incontinence Assessment and Management 0 []  - Ostomy Care Assessment and Management (repouching, etc.) 0 PROCESS - Coordination of Care X - Simple Patient / Family Education for ongoing care 1 15 []  - Complex (extensive) Patient / Family Education for ongoing care 0 X - Staff obtains Consents, Records, Test Results / Process Orders 1 10 MAE, CIANCI ([id number]) []  - Staff telephones HHA, Nursing Homes / Clarify orders / etc 0 []  - Routine Transfer to another Facility (non-emergent condition) 0 []  - Routine Hospital Admission (non-emergent condition) 0 []  - New Admissions / Biomedical engineer / Ordering NPWT, Apligraf, etc. 0 []  - Emergency Hospital Admission (emergent condition) 0 X - Simple Discharge Coordination 1 10 []  - Complex (extensive) Discharge Coordination 0 PROCESS - Special Needs []  - Pediatric / Minor Patient Management 0 []  - Isolation Patient Management 0 []  - Hearing / Language / Visual special needs 0 []  - Assessment of Community assistance (transportation, D/C planning, etc.) 0 []  - Additional assistance / Altered  mentation 0 []  - Support Surface(s) Assessment (bed, cushion, seat, etc.) 0 INTERVENTIONS - Wound Cleansing / Measurement X - Simple Wound Cleansing - one wound 1 5 []  - Complex Wound Cleansing - multiple  wounds 0 X - Wound Imaging (photographs - any number of wounds) 1 5 []  - Wound Tracing (instead of photographs) 0 X - Simple Wound Measurement - one wound 1 5 []  - Complex Wound Measurement - multiple wounds 0 INTERVENTIONS - Wound Dressings []  - Small Wound Dressing one or multiple wounds 0 X - Medium Wound Dressing one or multiple wounds 1 15 []  - Large Wound Dressing one or multiple wounds 0 []  - Application of Medications - topical 0 []  - Application of Medications - injection 0 LINARD, DAFT ([id number]) INTERVENTIONS - Miscellaneous []  - External ear exam 0 []  - Specimen Collection (cultures, biopsies, blood, body fluids, etc.) 0 []  - Specimen(s) / Culture(s) sent or taken to Lab for analysis 0 []  - Patient Transfer (multiple staff / Harrel Lemon Lift / Similar devices) 0 []  - Simple Staple / Suture removal (25 or less) 0 []  - Complex Staple / Suture removal (26 or more) 0 []  - Hypo / Hyperglycemic Management (close monitor of Blood Glucose) 0 []  - Ankle / Brachial Index (ABI) - do not check if billed separately 0 X - Vital Signs 1 5 Has the patient been seen at the hospital within the last three years: Yes Total Score: 80 Level Of Care: New/Established - Level 3 Electronic Signature(s) Signed: 05/16/2017 5:08:24 PM By: Gretta Cool, BSN, RN, CWS, Kim RN, BSN Entered By: Gretta Cool, BSN, RN, CWS, Kim on 05/16/2017 09:46:02 Drew Calderon ([id number]) -------------------------------------------------------------------------------- Encounter Discharge Information Details Patient Name: Drew Calderon, Drew Calderon 05/16/2017 9:15 Date of Service: AM Medical Record [id number] Number: Patient Account Number: [id number] Date of Birth/Sex: [birth year] (76 y.o. Male) Treating RN: Deliah Boston, Other Clinician: Primary Care Arbor Leer: RUPASHREE Treating Britto, Bo Mcclintock, Aleen Marston/Extender: Referring Poonam Woehrle: Jake Samples in Treatment: 4 Encounter Discharge Information Items Discharge Pain Level: 0 Discharge Condition: Stable Ambulatory Status: Ambulatory Discharge Destination: Home Transportation: Private Auto Accompanied By: self Schedule Follow-up Appointment: Yes Medication Reconciliation completed and provided to Patient/Care Yes Dessie Delcarlo: Provided on Clinical Summary of Care: 05/16/2017 Form Type Recipient Paper Patient WS Electronic Signature(s) Signed: 05/16/2017 5:08:24 PM By: Gretta Cool, BSN, RN, CWS, Kim RN, BSN Entered By: Gretta Cool, BSN, RN, CWS, Kim on 05/16/2017 09:47:07 Drew Calderon ([id number]) -------------------------------------------------------------------------------- Lower Extremity Assessment Details Patient Name: Drew Calderon, Drew Calderon 05/16/2017 9:15 Date of Service: AM Medical Record [id number] Number: Patient Account Number: [id number] Date of Birth/Sex: February 09, 1941 (76 y.o. Male) Treating RN: Deliah Boston, Other Clinician: Primary Care Saba Gomm: RUPASHREE Treating Britto, Bo Mcclintock, Quinne Pires/Extender: Referring Braidon Chermak: Jake Samples in Treatment: 4 Electronic Signature(s) Signed: 05/16/2017 5:08:24 PM By: Gretta Cool, BSN, RN, CWS, Kim RN, BSN Entered By: Gretta Cool, BSN, RN, CWS, Kim on 05/16/2017 09:32:31 Drew Calderon ([id number]) -------------------------------------------------------------------------------- Multi Wound Chart Details Patient Name: Drew Calderon, Drew Calderon 05/16/2017 9:15 Date of Service: AM Medical Record [id number] Number: Patient Account Number: [id number] Date of Birth/Sex: 11-07-1940 (75 y.o. Male) Treating RN: Deliah Boston, Other Clinician: Primary Care Nayanna Seaborn: RUPASHREE Treating Britto, Bo Mcclintock, Chetara Kropp/Extender: Referring  Loyce Klasen: Jake Samples in Treatment: 4 Vital Signs Height(in): 72 Pulse(bpm): 61 Weight(lbs): 222 Blood Pressure 151/88 (mmHg): Body Mass Index(BMI): 30 Temperature(F): 98.2 Respiratory Rate 16 (breaths/min): Photos: [N/A:N/A] Wound Location: Right Forearm N/A N/A Wounding Event: Trauma N/A N/A Primary Etiology: Trauma, Other N/A N/A Date Acquired: 04/06/2017 N/A N/A Weeks of Treatment: 4 N/A N/A Wound Status: Open N/A N/A Measurements L x W x D 0.8x1.2x0.1 N/A N/A (cm) Area (cm) : 0.754 N/A N/A Volume (cm) : 0.075 N/A N/A % Reduction in  Area: 96.60% N/A N/A % Reduction in Volume: 96.60% N/A N/A Classification: Partial Thickness N/A N/A Exudate Amount: Small N/A N/A Exudate Type: Serous N/A N/A Exudate Color: amber N/A N/A Wound Margin: Flat and Intact N/A N/A Granulation Amount: Large (67-100%) N/A N/A Granulation Quality: Red, Hyper-granulation N/A N/A Necrotic Amount: Small (1-33%) N/A N/A Exposed Structures: N/A N/A Drew Calderon ([id number]) Fat Layer (Subcutaneous Tissue) Exposed: Yes Fascia: No Tendon: No Muscle: No Joint: No Bone: No Epithelialization: Small (1-33%) N/A N/A Periwound Skin Texture: Induration: Yes N/A N/A Scarring: Yes Excoriation: No Callus: No Crepitus: No Rash: No Periwound Skin Maceration: No N/A N/A Moisture: Dry/Scaly: No Periwound Skin Color: Atrophie Blanche: No N/A N/A Cyanosis: No Ecchymosis: No Erythema: No Hemosiderin Staining: No Mottled: No Pallor: No Rubor: No Temperature: No Abnormality N/A N/A Tenderness on No N/A N/A Palpation: Wound Preparation: Ulcer Cleansing: N/A N/A Rinsed/Irrigated with Saline Topical Anesthetic Applied: Other: lidocaine 4% Treatment Notes Wound #1 (Right Forearm) 1. Cleansed with: Clean wound with Normal Saline 2. Anesthetic Topical Lidocaine 4% cream to wound bed prior to debridement 4. Dressing Applied: Hydrafera Blue 5. Secondary Dressing Applied Bordered  Foam Dressing Electronic Signature(s) Signed: 05/16/2017 9:54:01 AM By: Christin Fudge MD, FACS Drew Calderon ([id number]) Entered By: Christin Fudge on 05/16/2017 09:54:00 Drew Calderon ([id number]) -------------------------------------------------------------------------------- Multi-Disciplinary Care Plan Details Patient Name: Drew Calderon, Drew Calderon 05/16/2017 9:15 Date of Service: AM Medical Record [id number] Number: Patient Account Number: [id number] Date of Birth/Sex: 1941/02/28 (76 y.o. Male) Treating RN: Deliah Boston, Other Clinician: Primary Care Aseneth Hack: RUPASHREE Treating Britto, Bo Mcclintock, Irisha Grandmaison/Extender: Referring Montario Zilka: Jake Samples in Treatment: 4 Active Inactive ` Abuse / Safety / Falls / Self Care Management Nursing Diagnoses: History of Falls Potential for falls Goals: Patient will remain injury free related to falls Date Initiated: 04/15/2017 Target Resolution Date: 04/26/2017 Goal Status: Active Interventions: Assess fall risk on admission and as needed Notes: ` Orientation to the Wound Care Program Nursing Diagnoses: Knowledge deficit related to the wound healing center program Goals: Patient/caregiver will verbalize understanding of the [facility] Program Date Initiated: 04/15/2017 Target Resolution Date: 04/26/2017 Goal Status: Active Interventions: Provide education on orientation to the wound center Notes: ` Wound/Skin Impairment JUNIPER, SNYDERS ([id number]) Nursing Diagnoses: Impaired tissue integrity Goals: Ulcer/skin breakdown will heal within 14 weeks Date Initiated: 04/15/2017 Target Resolution Date: 07/29/2017 Goal Status: Active Interventions: Assess patient/caregiver ability to obtain necessary supplies Treatment Activities: Skin care regimen initiated : 04/15/2017 Topical wound management initiated : 04/15/2017 Notes: Electronic Signature(s) Signed: 05/16/2017 5:08:24 PM By:  Gretta Cool, BSN, RN, CWS, Kim RN, BSN Entered By: Gretta Cool, BSN, RN, CWS, Kim on 05/16/2017 09:44:31 Drew Calderon ([id number]) -------------------------------------------------------------------------------- Pain Assessment Details Patient Name: Drew Calderon. 05/16/2017 9:15 Date of Service: AM Medical Record [id number] Number: Patient Account Number: [id number] Date of Birth/Sex: 01/27/1941 (76 y.o. Male) Treating RN: Deliah Boston, Other Clinician: Primary Care Rainie Crenshaw: RUPASHREE Treating Britto, Bo Mcclintock, Karlina Suares/Extender: Referring Banner Huckaba: Jake Samples in Treatment: 4 Active Problems Location of Pain Severity and Description of Pain Patient Has Paino No Site Locations With Dressing Change: No Pain Management and Medication Current Pain Management: Goals for Pain Management Topical or injectable lidocaine is offered to patient for acute pain when surgical debridement is performed. If needed, Patient is instructed to use over the counter pain medication for the following 24-48 hours after debridement. Wound care MDs do not prescribed pain medications. Patient has chronic pain or uncontrolled pain. Patient has been instructed to make an  appointment with their Primary Care Physician for pain management. Electronic Signature(s) Signed: 05/16/2017 5:08:24 PM By: Gretta Cool, BSN, RN, CWS, Kim RN, BSN Entered By: Gretta Cool, BSN, RN, CWS, Kim on 05/16/2017 09:26:40 Drew Calderon ([id number]) -------------------------------------------------------------------------------- Patient/Caregiver Education Details Patient Name: Drew Calderon, Drew Calderon 05/16/2017 9:15 Date of Service: AM Medical Record [id number] Number: Patient Account Number: [id number] Date of Birth/Gender: [birth year] (76 y.o. Male) Treating RN: Cornell Barman Primary Care [facility], Other Clinician: Physician: RUPASHREE Treating Britto, Bo Mcclintock, Physician/Extender: Referring  Physician: Jake Samples in Treatment: 4 Education Assessment Education Provided To: Patient Education Topics Provided Welcome To [facility]: Wound/Skin Impairment: Handouts: Caring for Your Ulcer, Other: continue wound care as prescribed Methods: Demonstration, Explain/Verbal Responses: State content correctly Electronic Signature(s) Signed: 05/16/2017 5:08:24 PM By: Gretta Cool, BSN, RN, CWS, Kim RN, BSN Entered By: Gretta Cool, BSN, RN, CWS, Kim on 05/16/2017 09:47:33 Drew Calderon ([id number]) -------------------------------------------------------------------------------- Wound Assessment Details Patient Name: Drew Calderon, Drew Calderon 05/16/2017 9:15 Date of Service: AM Medical Record [id number] Number: Patient Account Number: [id number] Date of Birth/Sex: 1940/08/10 (76 y.o. Male) Treating RN: Deliah Boston, Other Clinician: Primary Care Daxton Nydam: RUPASHREE Treating Britto, Bo Mcclintock, Cyrilla Durkin/Extender: Referring Rashanda Magloire: Jake Samples in Treatment: 4 Wound Status Wound Number: 1 Primary Etiology: Trauma, Other Wound Location: Right Forearm Wound Status: Open Wounding Event: Trauma Date Acquired: 04/06/2017 Weeks Of Treatment: 4 Clustered Wound: No Photos Wound Measurements Length: (cm) 0.8 Width: (cm) 1.2 Depth: (cm) 0.1 Area: (cm) 0.754 Volume: (cm) 0.075 % Reduction in Area: 96.6% % Reduction in Volume: 96.6% Epithelialization: Small (1-33%) Tunneling: No Undermining: No Wound Description Classification: Partial Thickness Foul Odor After Wound Margin: Flat and Intact Slough/Fibrino Exudate Amount: Small Exudate Type: Serous Exudate Color: amber Cleansing: No No Wound Bed Granulation Amount: Large (67-100%) Exposed Structure Granulation Quality: Red, Hyper-granulation Fascia Exposed: No Necrotic Amount: Small (1-33%) Fat Layer (Subcutaneous Tissue) Exposed: Yes Necrotic Quality: Adherent Slough Tendon Exposed:  No Muscle Exposed: No Drew Calderon, Drew Calderon ([id number]) Joint Exposed: No Bone Exposed: No Periwound Skin Texture Texture Color No Abnormalities Noted: No No Abnormalities Noted: No Callus: No Atrophie Blanche: No Crepitus: No Cyanosis: No Excoriation: No Ecchymosis: No Induration: Yes Erythema: No Rash: No Hemosiderin Staining: No Scarring: Yes Mottled: No Pallor: No Moisture Rubor: No No Abnormalities Noted: No Dry / Scaly: No Temperature / Pain Maceration: No Temperature: No Abnormality Wound Preparation Ulcer Cleansing: Rinsed/Irrigated with Saline Topical Anesthetic Applied: Other: lidocaine 4%, Treatment Notes Wound #1 (Right Forearm) 1. Cleansed with: Clean wound with Normal Saline 2. Anesthetic Topical Lidocaine 4% cream to wound bed prior to debridement 4. Dressing Applied: Hydrafera Blue 5. Secondary Dressing Applied Bordered Foam Dressing Electronic Signature(s) Signed: 05/16/2017 5:08:24 PM By: Gretta Cool, BSN, RN, CWS, Kim RN, BSN Entered By: Gretta Cool, BSN, RN, CWS, Kim on 05/16/2017 09:32:01 Drew Calderon ([id number]) -------------------------------------------------------------------------------- [place] Details Patient Name: Drew Calderon, Drew Calderon 05/16/2017 9:15 Date of Service: AM Medical Record [id number] Number: Patient Account Number: [id number] Date of Birth/Sex: 03/17/1941 (76 y.o. Male) Treating RN: Deliah Boston, Other Clinician: Primary Care Maddyn Lieurance: RUPASHREE Treating Britto, Bo Mcclintock, Hazell Siwik/Extender: Referring Lilith Solana: Jake Samples in Treatment: 4 Vital Signs Time Taken: 09:26 Temperature (F): 98.2 Height (in): 72 Pulse (bpm): 61 Weight (lbs): 222 Respiratory Rate (breaths/min): 16 Body Mass Index (BMI): 30.1 Blood Pressure (mmHg): 151/88 Reference Range: 80 - 120 mg / dl Electronic Signature(s) Signed: 05/16/2017 5:08:24 PM By: Gretta Cool, BSN, RN, CWS, Kim RN, BSN Entered By: Gretta Cool, BSN, RN, CWS,  Kim on 05/16/2017 09:32:21

## 2017-05-20 NOTE — Progress Notes (Signed)
[name], [name] ([id number]) Visit Report for 05/16/2017 Chief Complaint Document Details Patient Name: Drew Calderon, Drew Calderon 05/16/2017 9:15 Date of Service: AM Medical Record [id number] Number: Patient Account Number: [id number] Date of Birth/Sex: 1941/07/11 (76 y.o. Male) Treating RN: Deliah Boston, Other Clinician: Primary Care Provider: RUPASHREE Treating Allannah Kempen, Bo Mcclintock, Provider/Extender: Referring Provider: Jake Samples in Treatment: 4 Information Obtained from: Patient Chief Complaint Patient presents to the wound care center with open non-healing surgical wound(s) to the right forearm caused by a complex laceration on 04/06/2017 Electronic Signature(s) Signed: 05/16/2017 9:54:09 AM By: Christin Fudge MD, FACS Entered By: Christin Fudge on 05/16/2017 09:54:09 Drew Calderon ([id number]) -------------------------------------------------------------------------------- HPI Details Patient Name: Drew Calderon 05/16/2017 9:15 Date of Service: AM Medical Record [id number] Number: Patient Account Number: [id number] Date of Birth/Sex: [birth year] (76 y.o. Male) Treating RN: Deliah Boston, Other Clinician: Primary Care Provider: RUPASHREE Treating Roshawn Ayala, Bo Mcclintock, Provider/Extender: Referring Provider: Jake Samples in Treatment: 4 History of Present Illness Location: right lateral forearm just below elbow has a lacerated wound which is status post suturing Quality: Patient reports experiencing a dull pain to affected area(s). Severity: Patient states wound are getting worse. Duration: Patient has had the wound for < 2 weeks prior to presenting for treatment Timing: Pain in wound is Intermittent (comes and goes Context: The wound occurred when the patient had a fall in [place] and was taken to the OR for a complex lacerated suturing Modifying Factors: Other treatment(s) tried include:oral antibiotics and local  dressing Associated Signs and Symptoms: Patient reports having increase swelling. HPI Description: 76 year old male who was recently seen at the [facility] emergency medicine department at [facility], with a past medical history of hypertension, with complaints of right ankle swelling. He had recently returned from [place], [place], where he had recently tripped and fallen and was seen in the hospital where he underwent surgical debridement and laceration repair. He had 60 stitches placed to his right lateral forearm and was started on Keflex. He was concerned about a blood clot in his leg and would like to get evaluated for the same problem. x-rays done earlier showed no fracture. in the ER he was found to have edema of his right ankle and large ecchymosis to the right lateral thigh and hip. x-rays done at: Showed right ankle without acute bone abnormality and right foot showed no acute bone pathology. patient was started on doxycycline and a DVT study was recommended. this was done on 04/12/2017 -- Summary: - No evidence of deep vein or superficial thrombosis involving the right lower extremity and left common femoral vein. - No evidence of Bakerand's cyst on the right. I have reviewed electronic medical records of Dr. Cannon Kettle from [facility] -- instruction on 04/06/2017 recommended sutures and staples to come out in 11 days. x-rays done there showed right elbow 3 views -- no acute displaced fracture, and there were tiny foreign body radiopaque seen adjacent to the olecranon. X-ray right shoulder no acute fracture. x-ray of the pelvis and right hip -- no fracture. the patient was taken to the OR for closure of the wound. the procedure performed was a layered closure of complex laceration of the skin and soft tissue of the right elbow. Wound was irrigated with pulse irrigation of 5000 units of bacitracin and the patient also received 2 g of Ancef. The wound was debrided,  subcutaneous closed with 4-0 Vicryl interrupted sutures. A total of 30 4 interrupted sutures were applied to the  skin flaps. past medical history of hemochromatosis currently with phlebotomy 500 mL every 2 months, hypertension, colon polyps, ex-smoker stopped in 2014. 04/22/2017 -- the right forearm wound has completely necrosed and several nylon sutures are in place [name], [name] ([id number]) Electronic Signature(s) Signed: 05/16/2017 9:54:13 AM By: Christin Fudge MD, FACS Entered By: Christin Fudge on 05/16/2017 09:54:13 Drew Calderon ([id number]) -------------------------------------------------------------------------------- Physical Exam Details Patient Name: Drew Calderon 05/16/2017 9:15 Date of Service: AM Medical Record [id number] Number: Patient Account Number: [id number] Date of Birth/Sex: 12-14-1940 (76 y.o. Male) Treating RN: Deliah Boston, Other Clinician: Primary Care Provider: RUPASHREE Treating Joanna Borawski, Bo Mcclintock, Provider/Extender: Referring Provider: Jake Samples in Treatment: 4 Constitutional . Pulse regular. Respirations normal and unlabored. Afebrile. . Eyes Nonicteric. Reactive to light. Ears, Nose, Mouth, and Throat Lips, teeth, and gums WNL.Marland Kitchen Moist mucosa without lesions. Neck supple and nontender. No palpable supraclavicular or cervical adenopathy. Normal sized without goiter. Respiratory WNL. No retractions.. Cardiovascular Pedal Pulses WNL. No clubbing, cyanosis or edema. Lymphatic No adneopathy. No adenopathy. No adenopathy. Musculoskeletal Adexa without tenderness or enlargement.. Digits and nails w/o clubbing, cyanosis, infection, petechiae, ischemia, or inflammatory conditions.. Integumentary (Hair, Skin) No suspicious lesions. No crepitus or fluctuance. No peri-wound warmth or erythema. No masses.Marland Kitchen Psychiatric Judgement and insight Intact.. No evidence of depression, anxiety, or agitation.. Notes the  right forearm is looking much better with some superficial debris which was washed out with moist saline gauze and brisk bleeding was controlled with some silver nitrate. A PDS suture was removed. Electronic Signature(s) Signed: 05/16/2017 9:54:53 AM By: Christin Fudge MD, FACS Entered By: Christin Fudge on 05/16/2017 09:54:53 Drew Calderon ([id number]) -------------------------------------------------------------------------------- Physician Orders Details Patient Name: Drew Calderon, Drew Calderon 05/16/2017 9:15 Date of Service: AM Medical Record [id number] Number: Patient Account Number: [id number] Date of Birth/Sex: 1940-11-17 (76 y.o. Male) Treating RN: Deliah Boston, Other Clinician: Primary Care Provider: RUPASHREE Treating Kamali Sakata, Bo Mcclintock, Provider/Extender: Referring Provider: Jake Samples in Treatment: 4 Verbal / Phone Orders: No Diagnosis Coding Wound Cleansing Wound #1 Right Forearm o Clean wound with Normal Saline. Anesthetic Wound #1 Right Forearm o Topical Lidocaine 4% cream applied to wound bed prior to debridement Primary Wound Dressing Wound #1 Right Forearm o Hydrafera Blue Secondary Dressing Wound #1 Right Forearm o Boardered Foam Dressing Dressing Change Frequency Wound #1 Right Forearm o Change dressing every other day. Follow-up Appointments Wound #1 Right Forearm o Return Appointment in 1 week. Electronic Signature(s) Signed: 05/16/2017 4:12:58 PM By: Christin Fudge MD, FACS Signed: 05/16/2017 5:08:24 PM By: Gretta Cool BSN, RN, CWS, Kim RN, BSN Entered By: Gretta Cool, BSN, RN, CWS, Kim on 05/16/2017 09:45:34 Drew Calderon ([id number]) -------------------------------------------------------------------------------- Problem List Details Patient Name: Drew Calderon, Drew Calderon 05/16/2017 9:15 Date of Service: AM Medical Record [id number] Number: Patient Account Number: [id number] Date of Birth/Sex: Sep 13, 1940 (76 y.o.  Male) Treating RN: Deliah Boston, Other Clinician: Primary Care Provider: RUPASHREE Treating Candid Bovey, Bo Mcclintock, Provider/Extender: Referring Provider: Jake Samples in Treatment: 4 Active Problems ICD-10 Encounter Code Description Active Date Diagnosis S51.801A Unspecified open wound of right forearm, initial encounter 04/15/2017 Yes S51.811A Laceration without foreign body of right forearm, initial 04/15/2017 Yes encounter Inactive Problems Resolved Problems Electronic Signature(s) Signed: 05/16/2017 9:53:56 AM By: Christin Fudge MD, FACS Entered By: Christin Fudge on 05/16/2017 09:53:55 Drew Calderon ([id number]) -------------------------------------------------------------------------------- Progress Note Details Patient Name: Drew Calderon 05/16/2017 9:15 Date of Service: AM Medical Record [id number] Number: Patient Account Number: [id number] Date of Birth/Sex: 1940/11/12 (76 y.o. Male) Treating RN: Gretta Cool,  Lesia Sago, Other Clinician: Primary Care Provider: RUPASHREE Treating Finnleigh Marchetti, Bo Mcclintock, Provider/Extender: Referring Provider: Jake Samples in Treatment: 4 Subjective Chief Complaint Information obtained from Patient Patient presents to the wound care center with open non-healing surgical wound(s) to the right forearm caused by a complex laceration on 04/06/2017 History of Present Illness (HPI) The following HPI elements were documented for the patient's wound: Location: right lateral forearm just below elbow has a lacerated wound which is status post suturing Quality: Patient reports experiencing a dull pain to affected area(s). Severity: Patient states wound are getting worse. Duration: Patient has had the wound for < 2 weeks prior to presenting for treatment Timing: Pain in wound is Intermittent (comes and goes Context: The wound occurred when the patient had a fall in [place] and was taken to the OR for  a complex lacerated suturing Modifying Factors: Other treatment(s) tried include:oral antibiotics and local dressing Associated Signs and Symptoms: Patient reports having increase swelling. 76 year old male who was recently seen at the [facility] emergency medicine department at [facility], with a past medical history of hypertension, with complaints of right ankle swelling. He had recently returned from [place], [place], where he had recently tripped and fallen and was seen in the hospital where he underwent surgical debridement and laceration repair. He had 60 stitches placed to his right lateral forearm and was started on Keflex. He was concerned about a blood clot in his leg and would like to get evaluated for the same problem. x-rays done earlier showed no fracture. in the ER he was found to have edema of his right ankle and large ecchymosis to the right lateral thigh and hip. x-rays done at: Showed right ankle without acute bone abnormality and right foot showed no acute bone pathology. patient was started on doxycycline and a DVT study was recommended. this was done on 04/12/2017 -- Summary: - No evidence of deep vein or superficial thrombosis involving the right lower extremity and left common femoral vein. - No evidence of Bakerand's cyst on the right. I have reviewed electronic medical records of Dr. Cannon Kettle from [facility] -- instruction on 04/06/2017 recommended sutures and staples to come out in 11 days. x-rays done there showed right elbow 3 views -- no acute displaced fracture, and there were tiny foreign body radiopaque seen adjacent to the olecranon. X-ray right shoulder no acute fracture. x-ray of the pelvis and right hip -- no fracture. the patient was taken to the OR for closure of the wound. the procedure performed was a layered closure of Drew Calderon, Drew Calderon ([id number]) complex laceration of the skin and soft tissue of the right elbow. Wound was irrigated  with pulse irrigation of 5000 units of bacitracin and the patient also received 2 g of Ancef. The wound was debrided, subcutaneous closed with 4-0 Vicryl interrupted sutures. A total of 30 4 interrupted sutures were applied to the skin flaps. past medical history of hemochromatosis currently with phlebotomy 500 mL every 2 months, hypertension, colon polyps, ex-smoker stopped in 2014. 04/22/2017 -- the right forearm wound has completely necrosed and several nylon sutures are in place Objective Constitutional Pulse regular. Respirations normal and unlabored. Afebrile. Vitals Time Taken: 9:26 AM, Height: 72 in, Weight: 222 lbs, BMI: 30.1, Temperature: 98.2 F, Pulse: 61 bpm, Respiratory Rate: 16 breaths/min, Blood Pressure: 151/88 mmHg. Eyes Nonicteric. Reactive to light. Ears, Nose, Mouth, and Throat Lips, teeth, and gums WNL.Marland Kitchen Moist mucosa without lesions. Neck supple and nontender. No palpable supraclavicular or  cervical adenopathy. Normal sized without goiter. Respiratory WNL. No retractions.. Cardiovascular Pedal Pulses WNL. No clubbing, cyanosis or edema. Lymphatic No adneopathy. No adenopathy. No adenopathy. Musculoskeletal Adexa without tenderness or enlargement.. Digits and nails w/o clubbing, cyanosis, infection, petechiae, ischemia, or inflammatory conditions.Marland Kitchen Psychiatric Judgement and insight Intact.. No evidence of depression, anxiety, or agitation.Drew Calderon ([id number]) General Notes: the right forearm is looking much better with some superficial debris which was washed out with moist saline gauze and brisk bleeding was controlled with some silver nitrate. A PDS suture was removed. Integumentary (Hair, Skin) No suspicious lesions. No crepitus or fluctuance. No peri-wound warmth or erythema. No masses.. Wound #1 status is Open. Original cause of wound was Trauma. The wound is located on the Right Forearm. The wound measures 0.8cm length x 1.2cm width x 0.1cm  depth; 0.754cm^2 area and 0.075cm^3 volume. There is Fat Layer (Subcutaneous Tissue) Exposed exposed. There is no tunneling or undermining noted. There is a small amount of serous drainage noted. The wound margin is flat and intact. There is large (67-100%) red, hyper - granulation within the wound bed. There is a small (1-33%) amount of necrotic tissue within the wound bed including Adherent Slough. The periwound skin appearance exhibited: Induration, Scarring. The periwound skin appearance did not exhibit: Callus, Crepitus, Excoriation, Rash, Dry/Scaly, Maceration, Atrophie Blanche, Cyanosis, Ecchymosis, Hemosiderin Staining, Mottled, Pallor, Rubor, Erythema. Periwound temperature was noted as No Abnormality. Assessment Active Problems ICD-10 S51.801A - Unspecified open wound of right forearm, initial encounter S51.811A - Laceration without foreign body of right forearm, initial encounter Plan Wound Cleansing: Wound #1 Right Forearm: Clean wound with Normal Saline. Anesthetic: Wound #1 Right Forearm: Topical Lidocaine 4% cream applied to wound bed prior to debridement Primary Wound Dressing: Wound #1 Right Forearm: Hydrafera Blue Secondary Dressing: Wound #1 Right Forearm: Boardered Foam Dressing Dressing Change Frequency: Wound #1 Right Forearm: Drew Calderon ([id number]) Change dressing every other day. Follow-up Appointments: Wound #1 Right Forearm: Return Appointment in 1 week. he is doing very well overall and he did not need any further sharp debridement today and I have recommended application of Hydrofera Blue every other day with a bordered foam and come to see me back again next week. we also discussed appropriate amount of protein, vitamin A, vitamin C and zinc Electronic Signature(s) Signed: 05/16/2017 9:55:53 AM By: Christin Fudge MD, FACS Entered By: Christin Fudge on 05/16/2017 09:55:53 Drew Calderon  ([id number]) -------------------------------------------------------------------------------- SuperBill Details Patient Name: Drew Calderon. Date of Service: 05/16/2017 Medical Record Number: [id number] Patient Account Number: [id number] Date of Birth/Sex: [birth year] (76 y.o. Male) Treating RN: Cornell Barman Primary Care Provider: Leeroy Cha Other Clinician: Referring Provider: Leeroy Cha Treating Provider/Extender: Frann Rider in Treatment: 4 Diagnosis Coding ICD-10 Codes Code Description D98.338S Unspecified open wound of right forearm, initial encounter S51.811A Laceration without foreign body of right forearm, initial encounter Facility Procedures CPT4 Code: [id number] Description: [place] VISIT-LEV 3 EST PT Modifier: Quantity: 1 Physician Procedures CPT4 Code Description: [id number] [place] 3 - EST PT ICD-10 Description Diagnosis S51.801A Unspecified open wound of right forearm, initial encou S51.811A Laceration without foreign body of right forearm, init Modifier: nter ial encounte Quantity: 1 r Electronic Signature(s) Signed: 05/16/2017 9:56:08 AM By: Christin Fudge MD, FACS Entered By: Christin Fudge on 05/16/2017 09:56:08

## 2017-05-23 ENCOUNTER — Encounter: Payer: Medicare Other | Admitting: Surgery

## 2017-05-23 DIAGNOSIS — Z86010 Personal history of colonic polyps: Secondary | ICD-10-CM | POA: Diagnosis not present

## 2017-05-23 DIAGNOSIS — S51811A Laceration without foreign body of right forearm, initial encounter: Secondary | ICD-10-CM | POA: Diagnosis not present

## 2017-05-23 DIAGNOSIS — T8189XA Other complications of procedures, not elsewhere classified, initial encounter: Secondary | ICD-10-CM | POA: Diagnosis not present

## 2017-05-23 DIAGNOSIS — I10 Essential (primary) hypertension: Secondary | ICD-10-CM | POA: Diagnosis not present

## 2017-05-23 DIAGNOSIS — S51801A Unspecified open wound of right forearm, initial encounter: Secondary | ICD-10-CM | POA: Diagnosis not present

## 2017-05-27 DIAGNOSIS — L819 Disorder of pigmentation, unspecified: Secondary | ICD-10-CM | POA: Diagnosis not present

## 2017-05-27 NOTE — Progress Notes (Addendum)
Drew, Calderon ([id number]) Visit Report for 05/23/2017 Chief Complaint Document Details Patient Name: Drew Calderon, Drew Calderon. Date of Service: 05/23/2017 9:15 AM Medical Record Number: [id number] Patient Account Number: [id number] Date of Birth/Sex: 19-Nov-1940 (76 y.o. Male) Treating RN: Cornell Barman Primary Care Provider: Leeroy Cha Other Clinician: Referring Provider: Leeroy Cha Treating Provider/Extender: Frann Rider in Treatment: 5 Information Obtained from: Patient Chief Complaint Patient presents to the wound care center with open non-healing surgical wound(s) to the right forearm caused by a complex laceration on 04/06/2017 Electronic Signature(s) Signed: 05/23/2017 9:46:56 AM By: Christin Fudge MD, FACS Entered By: Christin Fudge on 05/23/2017 09:46:56 Drew Calderon ([id number]) -------------------------------------------------------------------------------- Debridement Details Patient Name: Drew Calderon. Date of Service: 05/23/2017 9:15 AM Medical Record Number: [id number] Patient Account Number: [id number] Date of Birth/Sex: [birth year] (76 y.o. Male) Treating RN: Cornell Barman Primary Care Provider: Leeroy Cha Other Clinician: Referring Provider: Leeroy Cha Treating Provider/Extender: Frann Rider in Treatment: 5 Debridement Performed for Wound #1 Right Forearm Assessment: Performed By: Physician Christin Fudge, MD Debridement: Open Wound/Selective Debridement Description: Selective Pre-procedure Verification/Time Yes - 09:37 Out Taken: Start Time: 09:37 Pain Control: Other : lidocaine 4% Level: Non-Viable Tissue Total Area Debrided (L x W): 0.2 (cm) x 0.4 (cm) = 0.08 (cm) Tissue and other material Non-Viable, Exudate, Fibrin/Slough debrided: Instrument: Forceps Bleeding: None End Time: 09:38 Procedural Pain: 0 Post Procedural Pain: 0 Response to Treatment: Procedure was tolerated  well Post Debridement Measurements of Total Wound Length: (cm) 0.2 Width: (cm) 0.4 Depth: (cm) 0.1 Volume: (cm) 0.006 Character of Wound/Ulcer Post Debridement: Stable Post Procedure Diagnosis Same as Pre-procedure Electronic Signature(s) Signed: 05/23/2017 9:46:50 AM By: Christin Fudge MD, FACS Signed: 05/23/2017 4:41:15 PM By: Gretta Cool, BSN, RN, CWS, Kim RN, BSN Entered By: Christin Fudge on 05/23/2017 09:46:50 Drew Calderon ([id number]) -------------------------------------------------------------------------------- HPI Details Patient Name: Drew Calderon. Date of Service: 05/23/2017 9:15 AM Medical Record Number: [id number] Patient Account Number: [id number] Date of Birth/Sex: 16-Jun-1941 (76 y.o. Male) Treating RN: Cornell Barman Primary Care Provider: Leeroy Cha Other Clinician: Referring Provider: Leeroy Cha Treating Provider/Extender: Frann Rider in Treatment: 5 History of Present Illness Location: right lateral forearm just below elbow has a lacerated wound which is status post suturing Quality: Patient reports experiencing a dull pain to affected area(s). Severity: Patient states wound are getting worse. Duration: Patient has had the wound for < 2 weeks prior to presenting for treatment Timing: Pain in wound is Intermittent (comes and goes Context: The wound occurred when the patient had a fall in [place] and was taken to the OR for a complex lacerated suturing Modifying Factors: Other treatment(s) tried include:oral antibiotics and local dressing Associated Signs and Symptoms: Patient reports having increase swelling. HPI Description: 76 year old male who was recently seen at the [facility] emergency medicine department at [facility], with a past medical history of hypertension, with complaints of right ankle swelling. He had recently returned from [place], [place], where he had recently tripped and fallen and was seen in the  hospital where he underwent surgical debridement and laceration repair. He had 60 stitches placed to his right lateral forearm and was started on Keflex. He was concerned about a blood clot in his leg and would like to get evaluated for the same problem. x-rays done earlier showed no fracture. in the ER he was found to have edema of his right ankle and large ecchymosis to the right lateral thigh and hip. x-rays done at: Showed right ankle without acute  bone abnormality and right foot showed no acute bone pathology. patient was started on doxycycline and a DVT study was recommended. this was done on 04/12/2017 -- Summary: - No evidence of deep vein or superficial thrombosis involving the right lower extremity and left common femoral vein. - No evidence of Bakerand's cyst on the right. I have reviewed electronic medical records of Dr. Cannon Kettle from [facility] -- instruction on 04/06/2017 recommended sutures and staples to come out in 11 days. x-rays done there showed right elbow 3 views -- no acute displaced fracture, and there were tiny foreign body radiopaque seen adjacent to the olecranon. X-ray right shoulder no acute fracture. x-ray of the pelvis and right hip -- no fracture. the patient was taken to the OR for closure of the wound. the procedure performed was a layered closure of complex laceration of the skin and soft tissue of the right elbow. Wound was irrigated with pulse irrigation of 5000 units of bacitracin and the patient also received 2 g of Ancef. The wound was debrided, subcutaneous closed with 4-0 Vicryl interrupted sutures. A total of 30 4 interrupted sutures were applied to the skin flaps. past medical history of hemochromatosis currently with phlebotomy 500 mL every 2 months, hypertension, colon polyps, ex- smoker stopped in 2014. 04/22/2017 -- the right forearm wound has completely necrosed and several nylon sutures are in place Electronic Signature(s) Signed:  05/23/2017 9:47:01 AM By: Christin Fudge MD, FACS Entered By: Christin Fudge on 05/23/2017 09:47:01 Drew Calderon ([id number]) -------------------------------------------------------------------------------- Physical Exam Details Patient Name: Drew Calderon. Date of Service: 05/23/2017 9:15 AM Medical Record Number: [id number] Patient Account Number: [id number] Date of Birth/Sex: 1941-05-03 (76 y.o. Male) Treating RN: Cornell Barman Primary Care Provider: Leeroy Cha Other Clinician: Referring Provider: Leeroy Cha Treating Provider/Extender: Frann Rider in Treatment: 5 Constitutional . Pulse regular. Respirations normal and unlabored. Afebrile. . Eyes Nonicteric. Reactive to light. Ears, Nose, Mouth, and Throat Lips, teeth, and gums WNL.Marland Kitchen Moist mucosa without lesions. Neck supple and nontender. No palpable supraclavicular or cervical adenopathy. Normal sized without goiter. Respiratory WNL. No retractions.. Cardiovascular Pedal Pulses WNL. No clubbing, cyanosis or edema. Lymphatic No adneopathy. No adenopathy. No adenopathy. Musculoskeletal Adexa without tenderness or enlargement.. Digits and nails w/o clubbing, cyanosis, infection, petechiae, ischemia, or inflammatory conditions.. Integumentary (Hair, Skin) No suspicious lesions. No crepitus or fluctuance. No peri-wound warmth or erythema. No masses.Marland Kitchen Psychiatric Judgement and insight Intact.. No evidence of depression, anxiety, or agitation.. Notes the wound on the right forearm is looking excellent with minimal exudate and debris which I sharply removed with a toothed forcep and the base looks very good Electronic Signature(s) Signed: 05/23/2017 9:47:46 AM By: Christin Fudge MD, FACS Entered By: Christin Fudge on 05/23/2017 09:47:41 Drew Calderon ([id number]) -------------------------------------------------------------------------------- Physician Orders Details Patient Name:  Drew Calderon. Date of Service: 05/23/2017 9:15 AM Medical Record Number: [id number] Patient Account Number: [id number] Date of Birth/Sex: Oct 05, 1940 (76 y.o. Male) Treating RN: Cornell Barman Primary Care Provider: Leeroy Cha Other Clinician: Referring Provider: Leeroy Cha Treating Provider/Extender: Frann Rider in Treatment: 5 Verbal / Phone Orders: Yes Clinician: Cornell Barman Read Back and Verified: Yes Diagnosis Coding Wound Cleansing Wound #1 Right Forearm o Clean wound with Normal Saline. Anesthetic Wound #1 Right Forearm o Topical Lidocaine 4% cream applied to wound bed prior to debridement Primary Wound Dressing Wound #1 Right Forearm o Hydrafera Blue Secondary Dressing Wound #1 Right Forearm o Boardered Foam Dressing Dressing Change Frequency Wound #1 Right Forearm o  Change dressing every other day. Follow-up Appointments Wound #1 Right Forearm o Return Appointment in 1 week. Electronic Signature(s) Signed: 05/23/2017 4:33:04 PM By: Christin Fudge MD, FACS Signed: 05/23/2017 4:41:15 PM By: Gretta Cool, BSN, RN, CWS, Kim RN, BSN Entered By: Gretta Cool, BSN, RN, CWS, Kim on 05/23/2017 09:44:09 Drew Calderon ([id number]) -------------------------------------------------------------------------------- Problem List Details Patient Name: MACKENZY, EISENBERG. Date of Service: 05/23/2017 9:15 AM Medical Record Number: [id number] Patient Account Number: [id number] Date of Birth/Sex: March 07, 1941 (76 y.o. Male) Treating RN: Cornell Barman Primary Care Provider: Leeroy Cha Other Clinician: Referring Provider: Leeroy Cha Treating Provider/Extender: Frann Rider in Treatment: 5 Active Problems ICD-10 Encounter Code Description Active Date Diagnosis S51.801A Unspecified open wound of right forearm, initial encounter 04/15/2017 Yes S51.811A Laceration without foreign body of right forearm, initial encounter  04/15/2017 Yes Inactive Problems Resolved Problems Electronic Signature(s) Signed: 05/23/2017 9:46:34 AM By: Christin Fudge MD, FACS Entered By: Christin Fudge on 05/23/2017 09:46:33 Drew Calderon ([id number]) -------------------------------------------------------------------------------- Progress Note Details Patient Name: Drew Calderon. Date of Service: 05/23/2017 9:15 AM Medical Record Number: [id number] Patient Account Number: [id number] Date of Birth/Sex: 08/31/1940 (76 y.o. Male) Treating RN: Cornell Barman Primary Care Provider: Leeroy Cha Other Clinician: Referring Provider: Leeroy Cha Treating Provider/Extender: Frann Rider in Treatment: 5 Subjective Chief Complaint Information obtained from Patient Patient presents to the wound care center with open non-healing surgical wound(s) to the right forearm caused by a complex laceration on 04/06/2017 History of Present Illness (HPI) The following HPI elements were documented for the patient's wound: Location: right lateral forearm just below elbow has a lacerated wound which is status post suturing Quality: Patient reports experiencing a dull pain to affected area(s). Severity: Patient states wound are getting worse. Duration: Patient has had the wound for < 2 weeks prior to presenting for treatment Timing: Pain in wound is Intermittent (comes and goes Context: The wound occurred when the patient had a fall in [place] and was taken to the OR for a complex lacerated suturing Modifying Factors: Other treatment(s) tried include:oral antibiotics and local dressing Associated Signs and Symptoms: Patient reports having increase swelling. 76 year old male who was recently seen at the [facility] emergency medicine department at [facility], with a past medical history of hypertension, with complaints of right ankle swelling. He had recently returned from [place], [place], where he had  recently tripped and fallen and was seen in the hospital where he underwent surgical debridement and laceration repair. He had 60 stitches placed to his right lateral forearm and was started on Keflex. He was concerned about a blood clot in his leg and would like to get evaluated for the same problem. x-rays done earlier showed no fracture. in the ER he was found to have edema of his right ankle and large ecchymosis to the right lateral thigh and hip. x-rays done at: Showed right ankle without acute bone abnormality and right foot showed no acute bone pathology. patient was started on doxycycline and a DVT study was recommended. this was done on 04/12/2017 -- Summary: - No evidence of deep vein or superficial thrombosis involving the right lower extremity and left common femoral vein. - No evidence of Bakerand's cyst on the right. I have reviewed electronic medical records of Dr. Cannon Kettle from [facility] -- instruction on 04/06/2017 recommended sutures and staples to come out in 11 days. x-rays done there showed right elbow 3 views -- no acute displaced fracture, and there were tiny foreign body radiopaque seen adjacent  to the olecranon. X-ray right shoulder no acute fracture. x-ray of the pelvis and right hip -- no fracture. the patient was taken to the OR for closure of the wound. the procedure performed was a layered closure of complex laceration of the skin and soft tissue of the right elbow. Wound was irrigated with pulse irrigation of 5000 units of bacitracin and the patient also received 2 g of Ancef. The wound was debrided, subcutaneous closed with 4-0 Vicryl interrupted sutures. A total of 30 4 interrupted sutures were applied to the skin flaps. past medical history of hemochromatosis currently with phlebotomy 500 mL every 2 months, hypertension, colon polyps, ex- smoker stopped in 2014. 04/22/2017 -- the right forearm wound has completely necrosed and several nylon sutures are in  place LANEY, BAGSHAW. ([id number]) Objective Constitutional Pulse regular. Respirations normal and unlabored. Afebrile. Vitals Time Taken: 9:20 AM, Height: 72 in, Weight: 222 lbs, BMI: 30.1, Temperature: 98.2 F, Pulse: 67 bpm, Respiratory Rate: 16 breaths/min, Blood Pressure: 161/87 mmHg. Eyes Nonicteric. Reactive to light. Ears, Nose, Mouth, and Throat Lips, teeth, and gums WNL.Marland Kitchen Moist mucosa without lesions. Neck supple and nontender. No palpable supraclavicular or cervical adenopathy. Normal sized without goiter. Respiratory WNL. No retractions.. Cardiovascular Pedal Pulses WNL. No clubbing, cyanosis or edema. Lymphatic No adneopathy. No adenopathy. No adenopathy. Musculoskeletal Adexa without tenderness or enlargement.. Digits and nails w/o clubbing, cyanosis, infection, petechiae, ischemia, or inflammatory conditions.Marland Kitchen Psychiatric Judgement and insight Intact.. No evidence of depression, anxiety, or agitation.. General Notes: the wound on the right forearm is looking excellent with minimal exudate and debris which I sharply removed with a toothed forcep and the base looks very good Integumentary (Hair, Skin) No suspicious lesions. No crepitus or fluctuance. No peri-wound warmth or erythema. No masses.. Wound #1 status is Open. Original cause of wound was Trauma. The wound is located on the Right Forearm. The wound measures 0.2cm length x 0.4cm width x 0.1cm depth; 0.063cm^2 area and 0.006cm^3 volume. The wound is limited to skin breakdown. There is no tunneling or undermining noted. There is a small amount of serous drainage noted. The wound margin is flat and intact. There is small (1-33%) red granulation within the wound bed. There is a large (67-100%) amount of necrotic tissue within the wound bed including Eschar. The periwound skin appearance exhibited: Scarring. The periwound skin appearance did not exhibit: Callus, Crepitus, Excoriation, Induration, Rash, Dry/Scaly,  Maceration, Atrophie Blanche, Cyanosis, Ecchymosis, Hemosiderin Staining, Mottled, Pallor, Rubor, Erythema. Periwound temperature was noted as No Abnormality. Assessment KHOI, HAMBERGER ([id number]) Active Problems ICD-10 [phone] - Unspecified open wound of right forearm, initial encounter S51.811A - Laceration without foreign body of right forearm, initial encounter Procedures Wound #1 Pre-procedure diagnosis of Wound #1 is a Trauma, Other located on the Right Forearm . There was a Non-Viable Tissue Open Wound/Selective [phone]) debridement with total area of 0.08 sq cm performed by Christin Fudge, MD. with the following instrument(s): Forceps to remove Non-Viable tissue/material including Exudate and Fibrin/Slough after achieving pain control using Other (lidocaine 4%). A time out was conducted at 09:37, prior to the start of the procedure. There was no bleeding. The procedure was tolerated well with a pain level of 0 throughout and a pain level of 0 following the procedure. Post Debridement Measurements: 0.2cm length x 0.4cm width x 0.1cm depth; 0.006cm^3 volume. Character of Wound/Ulcer Post Debridement is stable. Post procedure Diagnosis Wound #1: Same as Pre-Procedure Plan Wound Cleansing: Wound #1 Right Forearm: Clean wound with Normal Saline. Anesthetic:  Wound #1 Right Forearm: Topical Lidocaine 4% cream applied to wound bed prior to debridement Primary Wound Dressing: Wound #1 Right Forearm: Hydrafera Blue Secondary Dressing: Wound #1 Right Forearm: Boardered Foam Dressing Dressing Change Frequency: Wound #1 Right Forearm: Change dressing every other day. Follow-up Appointments: Wound #1 Right Forearm: Return Appointment in 1 week. I have recommended: 1. application of Hydrofera Blue every other day with a bordered foam SHEP, PORTER. ([id number]) 2. and come to see me back again next week. I anticipate discharge soon 3. we also discussed appropriate  amount of protein, vitamin A, vitamin C and zinc Electronic Signature(s) Signed: 05/23/2017 9:48:34 AM By: Christin Fudge MD, FACS Entered By: Christin Fudge on 05/23/2017 09:48:34 Drew Calderon ([id number]) -------------------------------------------------------------------------------- SuperBill Details Patient Name: Drew Calderon. Date of Service: 05/23/2017 Medical Record Number: [id number] Patient Account Number: [id number] Date of Birth/Sex: 12/08/1940 (76 y.o. Male) Treating RN: Cornell Barman Primary Care Provider: Leeroy Cha Other Clinician: Referring Provider: Leeroy Cha Treating Provider/Extender: Frann Rider in Treatment: 5 Diagnosis Coding ICD-10 Codes Code Description D78.242P Unspecified open wound of right forearm, initial encounter S51.811A Laceration without foreign body of right forearm, initial encounter Facility Procedures CPT4 Code: [id number] Description: [phone] - DEBRIDE WOUND 1ST 20 SQ CM OR < ICD-10 Diagnosis Description S51.801A Unspecified open wound of right forearm, initial encounter S51.811A Laceration without foreign body of right forearm, initial enco Modifier: unter Quantity: 1 Physician Procedures CPT4 Code: [id number] Description: [place] - WC PHYS DEBR WO ANESTH 20 SQ CM ICD-10 Diagnosis Description S51.801A Unspecified open wound of right forearm, initial encounter S51.811A Laceration without foreign body of right forearm, initial enco Modifier: unter Quantity: 1 Electronic Signature(s) Signed: 05/23/2017 10:10:33 AM By: Christin Fudge MD, FACS Previous Signature: 05/23/2017 9:49:13 AM Version By: Christin Fudge MD, FACS Previous Signature: 05/23/2017 9:48:48 AM Version By: Christin Fudge MD, FACS Entered By: Christin Fudge on 05/23/2017 10:10:33

## 2017-05-27 NOTE — Progress Notes (Signed)
QUADARIUS, HENTON ([id number]) Visit Report for 05/23/2017 Arrival Information Details Patient Name: Drew Calderon, Drew Calderon. Date of Service: 05/23/2017 9:15 AM Medical Record Number: [id number] Patient Account Number: [id number] Date of Birth/Sex: Oct 07, 1940 (76 y.o. Male) Treating RN: Cornell Barman Primary Care Zelina Jimerson: Leeroy Cha Other Clinician: Referring Cathie Bonnell: Leeroy Cha Treating Gwendolen Hewlett/Extender: Frann Rider in Treatment: 5 Visit Information History Since Last Visit Added or deleted any medications: No Patient Arrived: Ambulatory Any new allergies or adverse reactions: No Arrival Time: 09:19 Had a fall or experienced change in No Accompanied By: self activities of daily living that may affect Transfer Assistance: None risk of falls: Patient Identification Verified: Yes Signs or symptoms of abuse/neglect since last visito No Secondary Verification Process Yes Hospitalized since last visit: No Completed: Has Dressing in Place as Prescribed: Yes Patient Requires Transmission-Based No Pain Present Now: No Precautions: Patient Has Alerts: Yes Patient Alerts: Patient on Blood Thinner 81mg  aspirin Electronic Signature(s) Signed: 05/23/2017 4:41:15 PM By: Gretta Cool, BSN, RN, CWS, Kim RN, BSN Entered By: Gretta Cool, BSN, RN, CWS, Kim on 05/23/2017 09:19:48 Drew Calderon ([id number]) -------------------------------------------------------------------------------- Encounter Discharge Information Details Patient Name: Drew Calderon. Date of Service: 05/23/2017 9:15 AM Medical Record Number: [id number] Patient Account Number: [id number] Date of Birth/Sex: 1940-10-10 (76 y.o. Male) Treating RN: Cornell Barman Primary Care Ariahna Smiddy: Leeroy Cha Other Clinician: Referring Harish Bram: Leeroy Cha Treating Graeme Menees/Extender: Frann Rider in Treatment: 5 Encounter Discharge Information Items Discharge Pain Level:  0 Discharge Condition: Stable Ambulatory Status: Ambulatory Discharge Destination: Home Transportation: Private Auto Accompanied By: self Schedule Follow-up Appointment: Yes Medication Reconciliation completed and Yes provided to Patient/Care Roben Schliep: Provided on Clinical Summary of Care: 05/23/2017 Form Type Recipient Paper Patient WS Electronic Signature(s) Signed: 05/23/2017 4:41:15 PM By: Gretta Cool, BSN, RN, CWS, Kim RN, BSN Entered By: Gretta Cool, BSN, RN, CWS, Kim on 05/23/2017 09:45:45 Drew Calderon ([id number]) -------------------------------------------------------------------------------- Lower Extremity Assessment Details Patient Name: Drew Calderon. Date of Service: 05/23/2017 9:15 AM Medical Record Number: [id number] Patient Account Number: [id number] Date of Birth/Sex: 10/01/1940 (76 y.o. Male) Treating RN: Cornell Barman Primary Care Linus Weckerly: Leeroy Cha Other Clinician: Referring Meiko Stranahan: Leeroy Cha Treating Chevie Birkhead/Extender: Frann Rider in Treatment: 5 Electronic Signature(s) Signed: 05/23/2017 4:41:15 PM By: Gretta Cool, BSN, RN, CWS, Kim RN, BSN Entered By: Gretta Cool, BSN, RN, CWS, Kim on 05/23/2017 09:24:20 Drew Calderon ([id number]) -------------------------------------------------------------------------------- Multi Wound Chart Details Patient Name: Drew Calderon. Date of Service: 05/23/2017 9:15 AM Medical Record Number: [id number] Patient Account Number: [id number] Date of Birth/Sex: 1941/07/23 (76 y.o. Male) Treating RN: Cornell Barman Primary Care Mana Morison: Leeroy Cha Other Clinician: Referring Aarian Griffie: Leeroy Cha Treating Samay Delcarlo/Extender: Frann Rider in Treatment: 5 Vital Signs Height(in): 72 Pulse(bpm): 63 Weight(lbs): 222 Blood Pressure(mmHg): 161/87 Body Mass Index(BMI): 30 Temperature(F): 98.2 Respiratory Rate 16 (breaths/min): Photos: [N/A:N/A] Wound Location:  Right Forearm N/A N/A Wounding Event: Trauma N/A N/A Primary Etiology: Trauma, Other N/A N/A Date Acquired: 04/06/2017 N/A N/A Weeks of Treatment: 5 N/A N/A Wound Status: Open N/A N/A Measurements L x W x D 0.2x0.4x0.1 N/A N/A (cm) Area (cm) : 0.063 N/A N/A Volume (cm) : 0.006 N/A N/A % Reduction in Area: 99.70% N/A N/A % Reduction in Volume: 99.70% N/A N/A Classification: Partial Thickness N/A N/A Exudate Amount: Small N/A N/A Exudate Type: Serous N/A N/A Exudate Color: amber N/A N/A Wound Margin: Flat and Intact N/A N/A Granulation Amount: Small (1-33%) N/A N/A Granulation Quality: Red N/A N/A Necrotic Amount: Large (67-100%) N/A N/A Necrotic Tissue: Eschar N/A N/A Exposed Structures:  Fascia: No N/A N/A Fat Layer (Subcutaneous Tissue) Exposed: No Tendon: No Muscle: No Joint: No Bone: No Limited to Skin Breakdown Epithelialization: Medium (34-66%) N/A N/A Debridement: N/A N/A Drew Calderon ([id number]) Open Wound/Selective [phone]) - Selective Pre-procedure 09:37 N/A N/A Verification/Time Out Taken: Pain Control: Other N/A N/A Tissue Debrided: Fibrin/Slough, Exudates N/A N/A Level: Non-Viable Tissue N/A N/A Debridement Area (sq cm): 0.08 N/A N/A Instrument: Forceps N/A N/A Bleeding: None N/A N/A Procedural Pain: 0 N/A N/A Post Procedural Pain: 0 N/A N/A Debridement Treatment Procedure was tolerated well N/A N/A Response: Post Debridement 0.2x0.4x0.1 N/A N/A Measurements L x W x D (cm) Post Debridement Volume: 0.006 N/A N/A (cm) Periwound Skin Texture: Scarring: Yes N/A N/A Excoriation: No Induration: No Callus: No Crepitus: No Rash: No Periwound Skin Moisture: Maceration: No N/A N/A Dry/Scaly: No Periwound Skin Color: Atrophie Blanche: No N/A N/A Cyanosis: No Ecchymosis: No Erythema: No Hemosiderin Staining: No Mottled: No Pallor: No Rubor: No Temperature: No Abnormality N/A N/A Tenderness on Palpation: No N/A N/A Wound  Preparation: Ulcer Cleansing: N/A N/A Rinsed/Irrigated with Saline Topical Anesthetic Applied: Other: lidocaine 4% Procedures Performed: Debridement N/A N/A Treatment Notes Wound #1 (Right Forearm) 1. Cleansed with: Cleanse wound with antibacterial soap and water 2. Anesthetic Topical Lidocaine 4% cream to wound bed prior to debridement 4. Dressing Applied: Hydrafera Blue 5. Secondary Dressing Applied Bordered Foam Dressing ZAHARI, XIANG ([id number]) Electronic Signature(s) Signed: 05/23/2017 9:46:41 AM By: Christin Fudge MD, FACS Entered By: Christin Fudge on 05/23/2017 09:46:41 Drew Calderon ([id number]) -------------------------------------------------------------------------------- [place] Details Patient Name: IMRI, LOR. Date of Service: 05/23/2017 9:15 AM Medical Record Number: [id number] Patient Account Number: [id number] Date of Birth/Sex: 14-Apr-1941 (76 y.o. Male) Treating RN: Cornell Barman Primary Care Micco Bourbeau: Leeroy Cha Other Clinician: Referring Dickie Labarre: Leeroy Cha Treating Deidra Spease/Extender: Frann Rider in Treatment: 5 Active Inactive ` Abuse / Safety / Falls / Self Care Management Nursing Diagnoses: History of Falls Potential for falls Goals: Patient will remain injury free related to falls Date Initiated: 04/15/2017 Target Resolution Date: 04/26/2017 Goal Status: Active Interventions: Assess fall risk on admission and as needed Notes: ` Orientation to the Wound Care Program Nursing Diagnoses: Knowledge deficit related to the wound healing center program Goals: Patient/caregiver will verbalize understanding of the [facility] Program Date Initiated: 04/15/2017 Target Resolution Date: 04/26/2017 Goal Status: Active Interventions: Provide education on orientation to the wound center Notes: ` Wound/Skin Impairment Nursing Diagnoses: Impaired tissue  integrity Goals: Ulcer/skin breakdown will heal within 14 weeks Date Initiated: 04/15/2017 Target Resolution Date: 07/29/2017 Goal Status: Active ALY, HAUSER ([id number]) Interventions: Assess patient/caregiver ability to obtain necessary supplies Treatment Activities: Skin care regimen initiated : 04/15/2017 Topical wound management initiated : 04/15/2017 Notes: Electronic Signature(s) Signed: 05/23/2017 4:41:15 PM By: Gretta Cool, BSN, RN, CWS, Kim RN, BSN Entered By: Gretta Cool, BSN, RN, CWS, Kim on 05/23/2017 09:36:51 Drew Calderon ([id number]) -------------------------------------------------------------------------------- Pain Assessment Details Patient Name: Drew Calderon. Date of Service: 05/23/2017 9:15 AM Medical Record Number: [id number] Patient Account Number: [id number] Date of Birth/Sex: 21-Sep-1940 (76 y.o. Male) Treating RN: Cornell Barman Primary Care Lajuanna Pompa: Leeroy Cha Other Clinician: Referring Talyia Allende: Leeroy Cha Treating Finneus Kaneshiro/Extender: Frann Rider in Treatment: 5 Active Problems Location of Pain Severity and Description of Pain Patient Has Paino No Site Locations Pain Management and Medication Current Pain Management: Goals for Pain Management Topical or injectable lidocaine is offered to patient for acute pain when surgical debridement is performed. If needed, Patient is instructed to use  over the counter pain medication for the following 24-48 hours after debridement. Wound care MDs do not prescribed pain medications. Patient has chronic pain or uncontrolled pain. Patient has been instructed to make an appointment with their Primary Care Physician for pain management. Electronic Signature(s) Signed: 05/23/2017 4:41:15 PM By: Gretta Cool, BSN, RN, CWS, Kim RN, BSN Entered By: Gretta Cool, BSN, RN, CWS, Kim on 05/23/2017 09:20:00 Drew Calderon  ([id number]) -------------------------------------------------------------------------------- Patient/Caregiver Education Details Patient Name: KYNDAL, GLOSTER. Date of Service: 05/23/2017 9:15 AM Medical Record Number: [id number] Patient Account Number: [id number] Date of Birth/Gender: 06-Mar-1941 (76 y.o. Male) Treating RN: Cornell Barman Primary Care Physician: Leeroy Cha Other Clinician: Referring Physician: Leeroy Cha Treating Physician/Extender: Frann Rider in Treatment: 5 Education Assessment Education Provided To: Patient Education Topics Provided Wound/Skin Impairment: Handouts: Caring for Your Ulcer Methods: Demonstration, Explain/Verbal Responses: State content correctly Electronic Signature(s) Signed: 05/23/2017 4:41:15 PM By: Gretta Cool, BSN, RN, CWS, Kim RN, BSN Entered By: Gretta Cool, BSN, RN, CWS, Kim on 05/23/2017 09:45:56 Drew Calderon ([id number]) -------------------------------------------------------------------------------- Wound Assessment Details Patient Name: Drew Calderon. Date of Service: 05/23/2017 9:15 AM Medical Record Number: [id number] Patient Account Number: [id number] Date of Birth/Sex: Dec 08, 1940 (76 y.o. Male) Treating RN: Cornell Barman Primary Care Matraca Hunkins: Leeroy Cha Other Clinician: Referring Dorrene Bently: Leeroy Cha Treating Vera Furniss/Extender: Frann Rider in Treatment: 5 Wound Status Wound Number: 1 Primary Etiology: Trauma, Other Wound Location: Right Forearm Wound Status: Open Wounding Event: Trauma Date Acquired: 04/06/2017 Weeks Of Treatment: 5 Clustered Wound: No Photos Wound Measurements Length: (cm) 0.2 Width: (cm) 0.4 Depth: (cm) 0.1 Area: (cm) 0.063 Volume: (cm) 0.006 % Reduction in Area: 99.7% % Reduction in Volume: 99.7% Epithelialization: Medium (34-66%) Tunneling: No Undermining: No Wound Description Classification: Partial Thickness Foul Odor Wound  Margin: Flat and Intact Slough/Fi Exudate Amount: Small Exudate Type: Serous Exudate Color: amber After Cleansing: No brino No Wound Bed Granulation Amount: Small (1-33%) Exposed Structure Granulation Quality: Red Fascia Exposed: No Necrotic Amount: Large (67-100%) Fat Layer (Subcutaneous Tissue) Exposed: No Necrotic Quality: Eschar Tendon Exposed: No Muscle Exposed: No Joint Exposed: No Bone Exposed: No Limited to Skin Breakdown Periwound Skin Texture Texture Color No Abnormalities Noted: No No Abnormalities Noted: No Callus: No Atrophie Blanche: No KASHUS, KARLEN ([id number]) Crepitus: No Cyanosis: No Excoriation: No Ecchymosis: No Induration: No Erythema: No Rash: No Hemosiderin Staining: No Scarring: Yes Mottled: No Pallor: No Moisture Rubor: No No Abnormalities Noted: No Dry / Scaly: No Temperature / Pain Maceration: No Temperature: No Abnormality Wound Preparation Ulcer Cleansing: Rinsed/Irrigated with Saline Topical Anesthetic Applied: Other: lidocaine 4%, Treatment Notes Wound #1 (Right Forearm) 1. Cleansed with: Cleanse wound with antibacterial soap and water 2. Anesthetic Topical Lidocaine 4% cream to wound bed prior to debridement 4. Dressing Applied: Hydrafera Blue 5. Secondary Dressing Applied Bordered Foam Dressing Electronic Signature(s) Signed: 05/23/2017 4:41:15 PM By: Gretta Cool, BSN, RN, CWS, Kim RN, BSN Entered By: Gretta Cool, BSN, RN, CWS, Kim on 05/23/2017 09:24:09 Drew Calderon ([id number]) -------------------------------------------------------------------------------- [place] Details Patient Name: Drew Calderon Date of Service: 05/23/2017 9:15 AM Medical Record Number: [id number] Patient Account Number: [id number] Date of Birth/Sex: [birth year] (76 y.o. Male) Treating RN: Cornell Barman Primary Care Clora Ohmer: Leeroy Cha Other Clinician: Referring Saunders [place]: Leeroy Cha Treating Ebonee Stober/Extender:  Frann Rider in Treatment: 5 Vital Signs Time Taken: 09:20 Temperature (F): 98.2 Height (in): 72 Pulse (bpm): 67 Weight (lbs): 222 Respiratory Rate (breaths/min): 16 Body Mass Index (BMI): 30.1 Blood Pressure (mmHg): 161/87 Reference Range: 80 - 120 mg / dl  Electronic Signature(s) Signed: 05/23/2017 4:41:15 PM By: Gretta Cool, BSN, RN, CWS, Kim RN, BSN Entered By: Gretta Cool, BSN, RN, CWS, Kim on 05/23/2017 09:20:20

## 2017-05-31 ENCOUNTER — Encounter: Payer: Medicare Other | Attending: Surgery | Admitting: Surgery

## 2017-05-31 DIAGNOSIS — S51811A Laceration without foreign body of right forearm, initial encounter: Secondary | ICD-10-CM | POA: Insufficient documentation

## 2017-05-31 DIAGNOSIS — X58XXXA Exposure to other specified factors, initial encounter: Secondary | ICD-10-CM | POA: Diagnosis not present

## 2017-05-31 DIAGNOSIS — I10 Essential (primary) hypertension: Secondary | ICD-10-CM | POA: Insufficient documentation

## 2017-05-31 DIAGNOSIS — S51801A Unspecified open wound of right forearm, initial encounter: Secondary | ICD-10-CM | POA: Diagnosis not present

## 2017-06-01 NOTE — Progress Notes (Addendum)
Drew Calderon, Drew Calderon ([id number]) Visit Report for 05/31/2017 Arrival Information Details Patient Name: Drew Calderon, Drew Calderon. Date of Service: 05/31/2017 9:15 AM Medical Record Number: [id number] Patient Account Number: [id number] Date of Birth/Sex: 23-May-1941 (76 y.o. Male) Treating RN: Roger Shelter Primary Care Jennett Tarbell: Leeroy Cha Other Clinician: Referring Genevieve Arbaugh: Leeroy Cha Treating Jentry Warnell/Extender: Frann Rider in Treatment: 6 Visit Information History Since Last Visit Added or deleted any medications: No Patient Arrived: Ambulatory Any new allergies or adverse reactions: No Arrival Time: 09:21 Had a fall or experienced change in No Accompanied By: wife activities of daily living that may affect Transfer Assistance: None risk of falls: Patient Requires Transmission-Based No Signs or symptoms of abuse/neglect since last visito No Precautions: Hospitalized since last visit: No Patient Has Alerts: Yes Pain Present Now: No Patient Alerts: Patient on Blood Thinner 81mg  aspirin Electronic Signature(s) Signed: 05/31/2017 9:46:34 AM By: Roger Shelter Entered By: Roger Shelter on 05/31/2017 09:22:17 Drew Calderon ([id number]) -------------------------------------------------------------------------------- Clinic Level of Care Assessment Details Patient Name: Drew Calderon. Date of Service: 05/31/2017 9:15 AM Medical Record Number: [id number] Patient Account Number: [id number] Date of Birth/Sex: 13-Sep-1940 (76 y.o. Male) Treating RN: Roger Shelter Primary Care Daly Whipkey: Leeroy Cha Other Clinician: Referring Anab Vivar: Leeroy Cha Treating Elantra Caprara/Extender: Frann Rider in Treatment: 6 Clinic Level of Care Assessment Items TOOL 4 Quantity Score []  - Use when only an EandM is performed on FOLLOW-UP visit 0 ASSESSMENTS - Nursing Assessment / Reassessment []  - Reassessment of Co-morbidities  (includes updates in patient status) 0 X- 1 5 Reassessment of Adherence to Treatment Plan ASSESSMENTS - Wound and Skin Assessment / Reassessment X - Simple Wound Assessment / Reassessment - one wound 1 5 []  - 0 Complex Wound Assessment / Reassessment - multiple wounds []  - 0 Dermatologic / Skin Assessment (not related to wound area) ASSESSMENTS - Focused Assessment []  - Circumferential Edema Measurements - multi extremities 0 []  - 0 Nutritional Assessment / Counseling / Intervention []  - 0 Lower Extremity Assessment (monofilament, tuning fork, pulses) []  - 0 Peripheral Arterial Disease Assessment (using hand held doppler) ASSESSMENTS - Ostomy and/or Continence Assessment and Care []  - Incontinence Assessment and Management 0 []  - 0 Ostomy Care Assessment and Management (repouching, etc.) PROCESS - Coordination of Care X - Simple Patient / Family Education for ongoing care 1 15 []  - 0 Complex (extensive) Patient / Family Education for ongoing care []  - 0 Staff obtains Programmer, systems, Records, Test Results / Process Orders []  - 0 Staff telephones HHA, Nursing Homes / Clarify orders / etc []  - 0 Routine Transfer to another Facility (non-emergent condition) []  - 0 Routine Hospital Admission (non-emergent condition) []  - 0 New Admissions / Biomedical engineer / Ordering NPWT, Apligraf, etc. []  - 0 Emergency Hospital Admission (emergent condition) X- 1 10 Simple Discharge Coordination Drew Calderon, Drew Calderon ([id number]) []  - 0 Complex (extensive) Discharge Coordination PROCESS - Special Needs []  - Pediatric / Minor Patient Management 0 []  - 0 Isolation Patient Management []  - 0 Hearing / Language / Visual special needs []  - 0 Assessment of Community assistance (transportation, D/C planning, etc.) []  - 0 Additional assistance / Altered mentation []  - 0 Support Surface(s) Assessment (bed, cushion, seat, etc.) INTERVENTIONS - Wound Cleansing / Measurement []  - Simple Wound  Cleansing - one wound 0 []  - 0 Complex Wound Cleansing - multiple wounds X- 1 5 Wound Imaging (photographs - any number of wounds) []  - 0 Wound Tracing (instead of photographs) []  - 0 Simple Wound Measurement - one  wound []  - 0 Complex Wound Measurement - multiple wounds INTERVENTIONS - Wound Dressings []  - Small Wound Dressing one or multiple wounds 0 []  - 0 Medium Wound Dressing one or multiple wounds []  - 0 Large Wound Dressing one or multiple wounds []  - 0 Application of Medications - topical []  - 0 Application of Medications - injection INTERVENTIONS - Miscellaneous []  - External ear exam 0 []  - 0 Specimen Collection (cultures, biopsies, blood, body fluids, etc.) []  - 0 Specimen(s) / Culture(s) sent or taken to Lab for analysis []  - 0 Patient Transfer (multiple staff / Civil Service fast streamer / Similar devices) []  - 0 Simple Staple / Suture removal (25 or less) []  - 0 Complex Staple / Suture removal (26 or more) []  - 0 Hypo / Hyperglycemic Management (close monitor of Blood Glucose) []  - 0 Ankle / Brachial Index (ABI) - do not check if billed separately X- 1 5 Vital Signs Drew Calderon, Drew Calderon ([id number]) Has the patient been seen at the hospital within the last three years: Yes Total Score: 45 Level Of Care: New/Established - Level 2 Electronic Signature(s) Signed: 05/31/2017 9:46:34 AM By: Roger Shelter Entered By: Roger Shelter on 05/31/2017 09:41:25 Drew Calderon ([id number]) -------------------------------------------------------------------------------- Encounter Discharge Information Details Patient Name: Drew Calderon. Date of Service: 05/31/2017 9:15 AM Medical Record Number: [id number] Patient Account Number: [id number] Date of Birth/Sex: April 16, 1941 (76 y.o. Male) Treating RN: Cornell Barman Primary Care Daisi Kentner: Leeroy Cha Other Clinician: Referring Adom Schoeneck: Leeroy Cha Treating Yakira Duquette/Extender: Frann Rider in  Treatment: 6 Encounter Discharge Information Items Discharge Pain Level: 0 Discharge Condition: Stable Ambulatory Status: Ambulatory Discharge Destination: Home Transportation: Private Auto Accompanied By: wife Schedule Follow-up Appointment: No Medication Reconciliation completed and No provided to Patient/Care Kelcie Currie: Provided on Clinical Summary of Care: 05/31/2017 Form Type Recipient Paper Patient WS Electronic Signature(s) Signed: 05/31/2017 9:46:34 AM By: Roger Shelter Entered By: Roger Shelter on 05/31/2017 09:45:12 Drew Calderon ([id number]) -------------------------------------------------------------------------------- Lower Extremity Assessment Details Patient Name: Drew Calderon. Date of Service: 05/31/2017 9:15 AM Medical Record Number: [id number] Patient Account Number: [id number] Date of Birth/Sex: 03-30-1941 (76 y.o. Male) Treating RN: Roger Shelter Primary Care Waldo Damian: Leeroy Cha Other Clinician: Referring Cabell Lazenby: Leeroy Cha Treating Shantavia Jha/Extender: Frann Rider in Treatment: 6 Electronic Signature(s) Signed: 05/31/2017 9:46:34 AM By: Roger Shelter Entered By: Roger Shelter on 05/31/2017 09:28:41 Drew Calderon ([id number]) -------------------------------------------------------------------------------- Multi Wound Chart Details Patient Name: Drew Calderon. Date of Service: 05/31/2017 9:15 AM Medical Record Number: [id number] Patient Account Number: [id number] Date of Birth/Sex: 1941/03/10 (76 y.o. Male) Treating RN: Roger Shelter Primary Care Edker Punt: Leeroy Cha Other Clinician: Referring Bryella Diviney: Leeroy Cha Treating Jeannifer Drakeford/Extender: Frann Rider in Treatment: 6 Vital Signs Height(in): 72 Pulse(bpm): 70 Weight(lbs): 222 Blood Pressure(mmHg): 142/78 Body Mass Index(BMI): 30 Temperature(F): 97.9 Respiratory Rate 16 (breaths/min): Photos:  [N/A:N/A] Wound Location: Right Forearm N/A N/A Wounding Event: Trauma N/A N/A Primary Etiology: Trauma, Other N/A N/A Date Acquired: 04/06/2017 N/A N/A Weeks of Treatment: 6 N/A N/A Wound Status: Healed - Epithelialized N/A N/A Measurements L x W x D 0x0x0 N/A N/A (cm) Area (cm) : 0 N/A N/A Volume (cm) : 0 N/A N/A % Reduction in Area: 100.00% N/A N/A % Reduction in Volume: 100.00% N/A N/A Classification: Partial Thickness N/A N/A Periwound Skin Texture: No Abnormalities Noted N/A N/A Periwound Skin Moisture: No Abnormalities Noted N/A N/A Periwound Skin Color: No Abnormalities Noted N/A N/A Tenderness on Palpation: No N/A N/A Treatment Notes Electronic Signature(s) Signed: 05/31/2017 10:04:29 AM By: Con Memos,  Roderick Pee MD, FACS Previous Signature: 05/31/2017 9:46:34 AM Version By: Roger Shelter Entered By: Christin Fudge on 05/31/2017 10:04:28 Drew Calderon ([id number]) -------------------------------------------------------------------------------- [place] Details Patient Name: Drew Calderon, Drew Calderon. Date of Service: 05/31/2017 9:15 AM Medical Record Number: [id number] Patient Account Number: [id number] Date of Birth/Sex: August 07, 1940 (76 y.o. Male) Treating RN: Roger Shelter Primary Care Deshanda Molitor: Leeroy Cha Other Clinician: Referring Antjuan Rothe: Leeroy Cha Treating Jiana Lemaire/Extender: Frann Rider in Treatment: 6 Active Inactive Electronic Signature(s) Signed: 05/31/2017 9:46:34 AM By: Roger Shelter Entered By: Roger Shelter on 05/31/2017 09:36:13 Drew Calderon ([id number]) -------------------------------------------------------------------------------- Pain Assessment Details Patient Name: Drew Calderon. Date of Service: 05/31/2017 9:15 AM Medical Record Number: [id number] Patient Account Number: [id number] Date of Birth/Sex: [birth year] (76 y.o. Male) Treating RN: Roger Shelter Primary Care Nohelia Valenza:  Leeroy Cha Other Clinician: Referring Kamiah Fite: Leeroy Cha Treating Rada Zegers/Extender: Frann Rider in Treatment: 6 Active Problems Location of Pain Severity and Description of Pain Patient Has Paino No Site Locations Pain Management and Medication Current Pain Management: Electronic Signature(s) Signed: 05/31/2017 9:46:34 AM By: Roger Shelter Entered By: Roger Shelter on 05/31/2017 09:22:26 Drew Calderon ([id number]) -------------------------------------------------------------------------------- Wound Assessment Details Patient Name: Drew Calderon. Date of Service: 05/31/2017 9:15 AM Medical Record Number: [id number] Patient Account Number: [id number] Date of Birth/Sex: 1941-02-23 (76 y.o. Male) Treating RN: Roger Shelter Primary Care Shadasia Oldfield: Leeroy Cha Other Clinician: Referring Manish Ruggiero: Leeroy Cha Treating Coleen Cardiff/Extender: Frann Rider in Treatment: 6 Wound Status Wound Number: 1 Primary Etiology: Trauma, Other Wound Location: Right Forearm Wound Status: Healed - Epithelialized Wounding Event: Trauma Date Acquired: 04/06/2017 Weeks Of Treatment: 6 Clustered Wound: No Photos Photo Uploaded By: Roger Shelter on 05/31/2017 09:29:41 Wound Measurements Length: (cm) 0 Width: (cm) 0 Depth: (cm) 0 Area: (cm) 0 Volume: (cm) 0 % Reduction in Area: 100% % Reduction in Volume: 100% Wound Description Classification: Partial Thickness Periwound Skin Texture Texture Color No Abnormalities Noted: No No Abnormalities Noted: No Moisture No Abnormalities Noted: No Electronic Signature(s) Signed: 05/31/2017 9:46:34 AM By: Roger Shelter Entered By: Roger Shelter on 05/31/2017 09:28:20 Drew Calderon ([id number]) -------------------------------------------------------------------------------- Vitals Details Patient Name: Drew Calderon. Date of Service: 05/31/2017 9:15  AM Medical Record Number: [id number] Patient Account Number: [id number] Date of Birth/Sex: 07-22-1941 (76 y.o. Male) Treating RN: Roger Shelter Primary Care Derl Abalos: Leeroy Cha Other Clinician: Referring Jacobs Golab: Leeroy Cha Treating Jawuan Robb/Extender: Frann Rider in Treatment: 6 Vital Signs Time Taken: 09:22 Temperature (F): 97.9 Height (in): 72 Pulse (bpm): 70 Weight (lbs): 222 Respiratory Rate (breaths/min): 16 Body Mass Index (BMI): 30.1 Blood Pressure (mmHg): 142/78 Reference Range: 80 - 120 mg / dl Electronic Signature(s) Signed: 05/31/2017 9:46:34 AM By: Roger Shelter Entered By: Roger Shelter on 05/31/2017 55:73:22

## 2017-06-02 NOTE — Progress Notes (Signed)
RYANE, CANAVAN ([id number]) Visit Report for 05/31/2017 Chief Complaint Document Details Patient Name: Drew Calderon, Drew Calderon. Date of Service: 05/31/2017 9:15 AM Medical Record Number: [id number] Patient Account Number: [id number] Date of Birth/Sex: 30-Aug-1940 (76 y.o. Male) Treating RN: Cornell Barman Primary Care Provider: Leeroy Cha Other Clinician: Referring Provider: Leeroy Cha Treating Provider/Extender: Frann Rider in Treatment: 6 Information Obtained from: Patient Chief Complaint Patient presents to the wound care center with open non-healing surgical wound(s) to the right forearm caused by a complex laceration on 04/06/2017 Electronic Signature(s) Signed: 05/31/2017 10:04:38 AM By: Christin Fudge MD, FACS Entered By: Christin Fudge on 05/31/2017 10:04:38 Drew Calderon ([id number]) -------------------------------------------------------------------------------- HPI Details Patient Name: Drew Calderon. Date of Service: 05/31/2017 9:15 AM Medical Record Number: [id number] Patient Account Number: [id number] Date of Birth/Sex: 14-Mar-1941 (76 y.o. Male) Treating RN: Cornell Barman Primary Care Provider: Leeroy Cha Other Clinician: Referring Provider: Leeroy Cha Treating Provider/Extender: Frann Rider in Treatment: 6 History of Present Illness Location: right lateral forearm just below elbow has a lacerated wound which is status post suturing Quality: Patient reports experiencing a dull pain to affected area(s). Severity: Patient states wound are getting worse. Duration: Patient has had the wound for < 2 weeks prior to presenting for treatment Timing: Pain in wound is Intermittent (comes and goes Context: The wound occurred when the patient had a fall in [place] and was taken to the OR for a complex lacerated suturing Modifying Factors: Other treatment(s) tried include:oral antibiotics and local  dressing Associated Signs and Symptoms: Patient reports having increase swelling. HPI Description: 76 year old male who was recently seen at the [facility] emergency medicine department at [facility], with a past medical history of hypertension, with complaints of right ankle swelling. He had recently returned from [place], [place], where he had recently tripped and fallen and was seen in the hospital where he underwent surgical debridement and laceration repair. He had 60 stitches placed to his right lateral forearm and was started on Keflex. He was concerned about a blood clot in his leg and would like to get evaluated for the same problem. x-rays done earlier showed no fracture. in the ER he was found to have edema of his right ankle and large ecchymosis to the right lateral thigh and hip. x-rays done at: Showed right ankle without acute bone abnormality and right foot showed no acute bone pathology. patient was started on doxycycline and a DVT study was recommended. this was done on 04/12/2017 -- Summary: - No evidence of deep vein or superficial thrombosis involving the right lower extremity and left common femoral vein. - No evidence of Bakerand's cyst on the right. I have reviewed electronic medical records of Dr. Cannon Kettle from [facility] -- instruction on 04/06/2017 recommended sutures and staples to come out in 11 days. x-rays done there showed right elbow 3 views -- no acute displaced fracture, and there were tiny foreign body radiopaque seen adjacent to the olecranon. X-ray right shoulder no acute fracture. x-ray of the pelvis and right hip -- no fracture. the patient was taken to the OR for closure of the wound. the procedure performed was a layered closure of complex laceration of the skin and soft tissue of the right elbow. Wound was irrigated with pulse irrigation of 5000 units of bacitracin and the patient also received 2 g of Ancef. The wound was debrided, subcutaneous  closed with 4-0 Vicryl interrupted sutures. A total of 30 4 interrupted sutures were applied to the  skin flaps. past medical history of hemochromatosis currently with phlebotomy 500 mL every 2 months, hypertension, colon polyps, ex- smoker stopped in 2014. 04/22/2017 -- the right forearm wound has completely necrosed and several nylon sutures are in place Electronic Signature(s) Signed: 05/31/2017 10:04:44 AM By: Christin Fudge MD, FACS Entered By: Christin Fudge on 05/31/2017 10:04:43 Drew Calderon ([id number]) -------------------------------------------------------------------------------- Physical Exam Details Patient Name: Drew Calderon. Date of Service: 05/31/2017 9:15 AM Medical Record Number: [id number] Patient Account Number: [id number] Date of Birth/Sex: 07-Jan-1941 (76 y.o. Male) Treating RN: Cornell Barman Primary Care Provider: Leeroy Cha Other Clinician: Referring Provider: Leeroy Cha Treating Provider/Extender: Frann Rider in Treatment: 6 Constitutional . Pulse regular. Respirations normal and unlabored. Afebrile. . Eyes Nonicteric. Reactive to light. Ears, Nose, Mouth, and Throat Lips, teeth, and gums WNL.Marland Kitchen Moist mucosa without lesions. Neck supple and nontender. No palpable supraclavicular or cervical adenopathy. Normal sized without goiter. Respiratory WNL. No retractions.. Cardiovascular Pedal Pulses WNL. No clubbing, cyanosis or edema. Lymphatic No adneopathy. No adenopathy. No adenopathy. Musculoskeletal Adexa without tenderness or enlargement.. Digits and nails w/o clubbing, cyanosis, infection, petechiae, ischemia, or inflammatory conditions.. Integumentary (Hair, Skin) No suspicious lesions. No crepitus or fluctuance. No peri-wound warmth or erythema. No masses.Marland Kitchen Psychiatric Judgement and insight Intact.. No evidence of depression, anxiety, or agitation.. Notes the wound of the right forearm has healed and is looking  excellent Electronic Signature(s) Signed: 05/31/2017 10:05:20 AM By: Christin Fudge MD, FACS Entered By: Christin Fudge on 05/31/2017 10:05:19 Drew Calderon ([id number]) -------------------------------------------------------------------------------- Physician Orders Details Patient Name: Drew Calderon Date of Service: 05/31/2017 9:15 AM Medical Record Number: [id number] Patient Account Number: [id number] Date of Birth/Sex: [birth year] (76 y.o. Male) Treating RN: Roger Shelter Primary Care Provider: Leeroy Cha Other Clinician: Referring Provider: Leeroy Cha Treating Provider/Extender: Frann Rider in Treatment: 6 Verbal / Phone Orders: No Diagnosis Coding Discharge From [facility] o Discharge from [place] complete Electronic Signature(s) Signed: 05/31/2017 9:46:34 AM By: Roger Shelter Signed: 05/31/2017 4:21:18 PM By: Christin Fudge MD, FACS Entered By: Roger Shelter on 05/31/2017 09:38:21 Drew Calderon ([id number]) -------------------------------------------------------------------------------- Problem List Details Patient Name: Drew Calderon. Date of Service: 05/31/2017 9:15 AM Medical Record Number: [id number] Patient Account Number: [id number] Date of Birth/Sex: 22-Feb-1941 (76 y.o. Male) Treating RN: Roger Shelter Primary Care Provider: Leeroy Cha Other Clinician: Referring Provider: Leeroy Cha Treating Provider/Extender: Frann Rider in Treatment: 6 Active Problems ICD-10 Encounter Code Description Active Date Diagnosis S51.801A Unspecified open wound of right forearm, initial encounter 04/15/2017 Yes S51.811A Laceration without foreign body of right forearm, initial encounter 04/15/2017 Yes Inactive Problems Resolved Problems Electronic Signature(s) Signed: 05/31/2017 10:04:23 AM By: Christin Fudge MD, FACS Previous Signature: 05/31/2017 9:46:34 AM Version  By: Roger Shelter Entered By: Christin Fudge on 05/31/2017 10:04:22 Drew Calderon ([id number]) -------------------------------------------------------------------------------- Progress Note Details Patient Name: Drew Calderon. Date of Service: 05/31/2017 9:15 AM Medical Record Number: [id number] Patient Account Number: [id number] Date of Birth/Sex: 1940-08-14 (76 y.o. Male) Treating RN: Cornell Barman Primary Care Provider: Leeroy Cha Other Clinician: Referring Provider: Leeroy Cha Treating Provider/Extender: Frann Rider in Treatment: 6 Subjective Chief Complaint Information obtained from Patient Patient presents to the wound care center with open non-healing surgical wound(s) to the right forearm caused by a complex laceration on 04/06/2017 History of Present Illness (HPI) The following HPI elements were documented for the patient's wound: Location: right lateral forearm just below elbow has a lacerated wound which is status post suturing Quality: Patient reports  experiencing a dull pain to affected area(s). Severity: Patient states wound are getting worse. Duration: Patient has had the wound for < 2 weeks prior to presenting for treatment Timing: Pain in wound is Intermittent (comes and goes Context: The wound occurred when the patient had a fall in [place] and was taken to the OR for a complex lacerated suturing Modifying Factors: Other treatment(s) tried include:oral antibiotics and local dressing Associated Signs and Symptoms: Patient reports having increase swelling. 76 year old male who was recently seen at the [facility] emergency medicine department at [facility], with a past medical history of hypertension, with complaints of right ankle swelling. He had recently returned from [place], [place], where he had recently tripped and fallen and was seen in the hospital where he underwent surgical debridement and laceration repair.  He had 60 stitches placed to his right lateral forearm and was started on Keflex. He was concerned about a blood clot in his leg and would like to get evaluated for the same problem. x-rays done earlier showed no fracture. in the ER he was found to have edema of his right ankle and large ecchymosis to the right lateral thigh and hip. x-rays done at: Showed right ankle without acute bone abnormality and right foot showed no acute bone pathology. patient was started on doxycycline and a DVT study was recommended. this was done on 04/12/2017 -- Summary: - No evidence of deep vein or superficial thrombosis involving the right lower extremity and left common femoral vein. - No evidence of Bakerand's cyst on the right. I have reviewed electronic medical records of Dr. Cannon Kettle from [facility] -- instruction on 04/06/2017 recommended sutures and staples to come out in 11 days. x-rays done there showed right elbow 3 views -- no acute displaced fracture, and there were tiny foreign body radiopaque seen adjacent to the olecranon. X-ray right shoulder no acute fracture. x-ray of the pelvis and right hip -- no fracture. the patient was taken to the OR for closure of the wound. the procedure performed was a layered closure of complex laceration of the skin and soft tissue of the right elbow. Wound was irrigated with pulse irrigation of 5000 units of bacitracin and the patient also received 2 g of Ancef. The wound was debrided, subcutaneous closed with 4-0 Vicryl interrupted sutures. A total of 30 4 interrupted sutures were applied to the skin flaps. past medical history of hemochromatosis currently with phlebotomy 500 mL every 2 months, hypertension, colon polyps, ex- smoker stopped in 2014. 04/22/2017 -- the right forearm wound has completely necrosed and several nylon sutures are in place Patient History Information obtained from Patient. Family History Cancer - Mother,Siblings, Drew Calderon, Drew Calderon ([id number]) No family history of Diabetes, Heart Disease, Hereditary Spherocytosis, Hypertension, Kidney Disease, Lung Disease, Seizures, Stroke, Thyroid Problems, Tuberculosis. Social History Former smoker, Marital Status - Married, Alcohol Use - Moderate, Drug Use - No History, Caffeine Use - Moderate. Medical And Surgical History Notes Constitutional Symptoms (General Health) HTN; Cholesterol; hemochromatosis Objective Constitutional Pulse regular. Respirations normal and unlabored. Afebrile. Vitals Time Taken: 9:22 AM, Height: 72 in, Weight: 222 lbs, BMI: 30.1, Temperature: 97.9 F, Pulse: 70 bpm, Respiratory Rate: 16 breaths/min, Blood Pressure: 142/78 mmHg. Eyes Nonicteric. Reactive to light. Ears, Nose, Mouth, and Throat Lips, teeth, and gums WNL.Marland Kitchen Moist mucosa without lesions. Neck supple and nontender. No palpable supraclavicular or cervical adenopathy. Normal sized without goiter. Respiratory WNL. No retractions.. Cardiovascular Pedal Pulses WNL. No clubbing, cyanosis or edema. Lymphatic  No adneopathy. No adenopathy. No adenopathy. Musculoskeletal Adexa without tenderness or enlargement.. Digits and nails w/o clubbing, cyanosis, infection, petechiae, ischemia, or inflammatory conditions.Marland Kitchen Psychiatric Judgement and insight Intact.. No evidence of depression, anxiety, or agitation.. General Notes: the wound of the right forearm has healed and is looking excellent Integumentary (Hair, Skin) No suspicious lesions. No crepitus or fluctuance. No peri-wound warmth or erythema. No masses.Drew Calderon ([id number]) Wound #1 status is Healed - Epithelialized. Original cause of wound was Trauma. The wound is located on the Right Forearm. The wound measures 0cm length x 0cm width x 0cm depth; 0cm^2 area and 0cm^3 volume. Assessment Active Problems ICD-10 S51.801A - Unspecified open wound of right forearm, initial encounter S51.811A - Laceration without foreign body  of right forearm, initial encounter Plan [facility]: Discharge from [place] - Treatment complete and the wound is completely healed and he is discharged from the wound care services and be seen back only if needed Electronic Signature(s) Signed: 05/31/2017 10:05:46 AM By: Christin Fudge MD, FACS Entered By: Christin Fudge on 05/31/2017 10:05:46 Drew Calderon ([id number]) -------------------------------------------------------------------------------- ROS/PFSH Details Patient Name: Drew Calderon. Date of Service: 05/31/2017 9:15 AM Medical Record Number: [id number] Patient Account Number: [id number] Date of Birth/Sex: September 17, 1940 (76 y.o. Male) Treating RN: Cornell Barman Primary Care Provider: Leeroy Cha Other Clinician: Referring Provider: Leeroy Cha Treating Provider/Extender: Frann Rider in Treatment: 6 Information Obtained From Patient Wound History Do you currently have one or more open woundso Yes How many open wounds do you currently haveo 1 Approximately how long have you had your woundso 2 weeks How have you been treating your wound(s) until nowo bacitracin Has your wound(s) ever healed and then re-openedo No Have you had any lab work done in the past montho No Have you tested positive for an antibiotic resistant organism (MRSA, VRE)o No Have you tested positive for osteomyelitis (bone infection)o No Have you had any tests for circulation on your legso No Have you had other problems associated with your woundso Infection, Swelling Constitutional Symptoms (General Health) Medical History: Past Medical History Notes: HTN; Cholesterol; hemochromatosis Eyes Medical History: Negative for: Cataracts; Glaucoma; Optic Neuritis Ear/Nose/Mouth/Throat Medical History: Negative for: Chronic sinus problems/congestion; Middle ear problems Hematologic/Lymphatic Medical History: Negative for: Anemia; Hemophilia; Human  Immunodeficiency Virus; Lymphedema; Sickle Cell Disease Respiratory Medical History: Negative for: Aspiration; Asthma; Chronic Obstructive Pulmonary Disease (COPD); Pneumothorax; Sleep Apnea; Tuberculosis Cardiovascular Medical History: Negative for: Angina; Arrhythmia; Congestive Heart Failure; Coronary Artery Disease; Deep Vein Thrombosis; Hypertension; Hypotension; Myocardial Infarction; Peripheral Arterial Disease; Peripheral Venous Disease; Phlebitis; Vasculitis Gastrointestinal Drew Calderon, Drew Calderon ([id number]) Medical History: Negative for: Cirrhosis ; Colitis; Crohnos; Hepatitis A; Hepatitis B; Hepatitis C Endocrine Medical History: Negative for: Type I Diabetes; Type II Diabetes Genitourinary Medical History: Negative for: End Stage Renal Disease Immunological Medical History: Negative for: Lupus Erythematosus; Raynaudos; Scleroderma Integumentary (Skin) Medical History: Negative for: History of Burn; History of pressure wounds Musculoskeletal Medical History: Negative for: Gout; Rheumatoid Arthritis; Osteoarthritis; Osteomyelitis Neurologic Medical History: Negative for: Dementia; Neuropathy; Quadriplegia; Paraplegia; Seizure Disorder Oncologic Medical History: Negative for: Received Chemotherapy; Received Radiation Psychiatric Medical History: Negative for: Anorexia/bulimia; Confinement Anxiety Immunizations Pneumococcal Vaccine: Received Pneumococcal Vaccination: Yes Implantable Devices Family and Social History Cancer: Yes - Mother,Siblings; Diabetes: No; Heart Disease: No; Hereditary Spherocytosis: No; Hypertension: No; Kidney Disease: No; Lung Disease: No; Seizures: No; Stroke: No; Thyroid Problems: No; Tuberculosis: No; Former smoker; Marital Status - Married; Alcohol Use: Moderate; Drug Use: No History; Caffeine Use:  Moderate; Advanced Directives: Yes (Not Provided); Patient does not want information on Advanced Directives; Living Will: Yes (Not  Provided) Physician Affirmation I have reviewed and agree with the above information. Drew Calderon, Drew Calderon ([id number]) Electronic Signature(s) Signed: 05/31/2017 10:15:24 AM By: Gretta Cool BSN, RN, CWS, Kim RN, BSN Signed: 05/31/2017 4:21:18 PM By: Christin Fudge MD, FACS Entered By: Christin Fudge on 05/31/2017 10:04:52 Drew Calderon ([id number]) -------------------------------------------------------------------------------- SuperBill Details Patient Name: Drew Calderon, Drew Calderon. Date of Service: 05/31/2017 Medical Record Number: [id number] Patient Account Number: [id number] Date of Birth/Sex: 1940/09/09 (76 y.o. Male) Treating RN: Roger Shelter Primary Care Provider: Leeroy Cha Other Clinician: Referring Provider: Leeroy Cha Treating Provider/Extender: Frann Rider in Treatment: 6 Diagnosis Coding ICD-10 Codes Code Description S51.801A Unspecified open wound of right forearm, initial encounter S51.811A Laceration without foreign body of right forearm, initial encounter Facility Procedures CPT4 Code: 11552080 Description: [phone] - WOUND CARE VISIT-LEV 2 EST PT Modifier: Quantity: 1 Physician Procedures CPT4 Code: 1224497 Description: 53005 - WC PHYS LEVEL 2 - EST PT ICD-10 Diagnosis Description S51.801A Unspecified open wound of right forearm, initial encounter S51.811A Laceration without foreign body of right forearm, initial en Modifier: counter Quantity: 1 Electronic Signature(s) Signed: 05/31/2017 10:06:02 AM By: Christin Fudge MD, FACS Previous Signature: 05/31/2017 9:46:34 AM Version By: Roger Shelter Entered By: Christin Fudge on 05/31/2017 10:06:02

## 2017-07-03 ENCOUNTER — Other Ambulatory Visit (HOSPITAL_COMMUNITY): Payer: Self-pay

## 2017-07-04 ENCOUNTER — Encounter (HOSPITAL_COMMUNITY)
Admission: RE | Admit: 2017-07-04 | Discharge: 2017-07-04 | Disposition: A | Discharge status: Home / Self Care | Payer: Medicare Other | Source: Ambulatory Visit | Attending: Internal Medicine | Admitting: Internal Medicine

## 2017-07-04 LAB — POCT HEMOGLOBIN-HEMACUE: Hemoglobin: 16.1 g/dL (ref 13.0–17.0)

## 2017-07-04 MED ORDER — LIDOCAINE HCL 2 % IJ SOLN: 0.1000 mL | Freq: Once | INTRAMUSCULAR | Status: DC

## 2017-07-04 MED ORDER — LIDOCAINE HCL (PF) 1 % IJ SOLN
INTRAMUSCULAR | Status: AC
  Administered 2017-07-04: 2 mL via SUBCUTANEOUS
  Filled 2017-07-04: qty 2

## 2017-07-16 DIAGNOSIS — Z23 Encounter for immunization: Secondary | ICD-10-CM | POA: Diagnosis not present

## 2017-09-05 DIAGNOSIS — I10 Essential (primary) hypertension: Secondary | ICD-10-CM | POA: Diagnosis not present

## 2017-09-05 DIAGNOSIS — E7800 Pure hypercholesterolemia, unspecified: Secondary | ICD-10-CM | POA: Diagnosis not present

## 2017-10-09 ENCOUNTER — Other Ambulatory Visit (HOSPITAL_COMMUNITY): Payer: Self-pay | Admitting: *Deleted

## 2017-10-10 ENCOUNTER — Encounter (HOSPITAL_COMMUNITY)
Admission: RE | Admit: 2017-10-10 | Discharge: 2017-10-10 | Disposition: A | Discharge status: Home / Self Care | Payer: Medicare Other | Source: Ambulatory Visit | Attending: Internal Medicine | Admitting: Internal Medicine

## 2017-10-10 LAB — POCT HEMOGLOBIN-HEMACUE: Hemoglobin: 16.6 g/dL (ref 13.0–17.0)

## 2017-10-10 MED ORDER — LIDOCAINE HCL (PF) 1 % IJ SOLN
2.0000 mL | Freq: Once | INTRAMUSCULAR | Status: AC
  Administered 2017-10-10: 2 mL via INTRADERMAL

## 2017-10-10 MED ORDER — LIDOCAINE HCL (PF) 1 % IJ SOLN
INTRAMUSCULAR | Status: AC
  Administered 2017-10-10: 2 mL via INTRADERMAL
  Filled 2017-10-10: qty 2

## 2017-10-10 NOTE — Progress Notes (Signed)
Pt here for therapeutic phlebotomy. Hemocue 16.6. 500 ml removed from right antecubital area as ordered by MD. Pt tolerated well.

## 2018-03-06 ENCOUNTER — Other Ambulatory Visit: Payer: Self-pay | Admitting: Internal Medicine

## 2018-03-06 ENCOUNTER — Ambulatory Visit
Admission: RE | Admit: 2018-03-06 | Discharge: 2018-03-06 | Disposition: A | Discharge status: Home / Self Care | Payer: Medicare Other | Source: Ambulatory Visit | Attending: Internal Medicine | Admitting: Internal Medicine

## 2018-03-06 DIAGNOSIS — E7800 Pure hypercholesterolemia, unspecified: Secondary | ICD-10-CM | POA: Diagnosis not present

## 2018-03-06 DIAGNOSIS — Z125 Encounter for screening for malignant neoplasm of prostate: Secondary | ICD-10-CM | POA: Diagnosis not present

## 2018-03-06 DIAGNOSIS — Z0000 Encounter for general adult medical examination without abnormal findings: Secondary | ICD-10-CM | POA: Diagnosis not present

## 2018-03-06 DIAGNOSIS — Z1389 Encounter for screening for other disorder: Secondary | ICD-10-CM | POA: Diagnosis not present

## 2018-03-06 DIAGNOSIS — J45909 Unspecified asthma, uncomplicated: Secondary | ICD-10-CM

## 2018-03-06 DIAGNOSIS — R05 Cough: Secondary | ICD-10-CM | POA: Diagnosis not present

## 2018-03-06 DIAGNOSIS — I10 Essential (primary) hypertension: Secondary | ICD-10-CM | POA: Diagnosis not present

## 2018-03-06 DIAGNOSIS — Z86010 Personal history of colonic polyps: Secondary | ICD-10-CM | POA: Diagnosis not present

## 2018-04-14 ENCOUNTER — Other Ambulatory Visit (HOSPITAL_COMMUNITY): Payer: Self-pay

## 2018-04-15 ENCOUNTER — Ambulatory Visit (HOSPITAL_COMMUNITY)
Admission: RE | Admit: 2018-04-15 | Discharge: 2018-04-15 | Disposition: A | Discharge status: Home / Self Care | Payer: Medicare Other | Source: Ambulatory Visit | Attending: Internal Medicine | Admitting: Internal Medicine

## 2018-04-15 LAB — POCT HEMOGLOBIN-HEMACUE: Hemoglobin: 15.7 g/dL (ref 13.0–17.0)

## 2018-04-15 MED ORDER — LIDOCAINE HCL (PF) 1 % IJ SOLN
INTRAMUSCULAR | Status: AC
  Administered 2018-04-15: 2 mL
  Filled 2018-04-15: qty 2

## 2018-04-15 MED ORDER — LIDOCAINE HCL 2 % IJ SOLN: 2.0000 mL | Freq: Once | INTRAMUSCULAR | Status: DC

## 2018-04-15 NOTE — Progress Notes (Signed)
Pt here for therapeutic phlebotomy. HemoCue 15.7. 500 ml blood removed from left AC area per MD order. Pt tolerated well. VSS.

## 2018-05-10 DIAGNOSIS — Z23 Encounter for immunization: Secondary | ICD-10-CM | POA: Diagnosis not present

## 2018-05-30 DIAGNOSIS — Z86010 Personal history of colonic polyps: Secondary | ICD-10-CM | POA: Diagnosis not present

## 2018-05-30 DIAGNOSIS — D122 Benign neoplasm of ascending colon: Secondary | ICD-10-CM | POA: Diagnosis not present

## 2018-05-30 DIAGNOSIS — D123 Benign neoplasm of transverse colon: Secondary | ICD-10-CM | POA: Diagnosis not present

## 2018-05-30 DIAGNOSIS — D120 Benign neoplasm of cecum: Secondary | ICD-10-CM | POA: Diagnosis not present

## 2018-06-03 DIAGNOSIS — D123 Benign neoplasm of transverse colon: Secondary | ICD-10-CM | POA: Diagnosis not present

## 2018-06-03 DIAGNOSIS — D120 Benign neoplasm of cecum: Secondary | ICD-10-CM | POA: Diagnosis not present

## 2018-06-03 DIAGNOSIS — D122 Benign neoplasm of ascending colon: Secondary | ICD-10-CM | POA: Diagnosis not present

## 2018-07-04 ENCOUNTER — Other Ambulatory Visit (HOSPITAL_COMMUNITY): Payer: Self-pay | Admitting: *Deleted

## 2018-07-07 ENCOUNTER — Encounter (HOSPITAL_COMMUNITY)
Admission: RE | Admit: 2018-07-07 | Discharge: 2018-07-07 | Disposition: A | Discharge status: Home / Self Care | Payer: Medicare Other | Source: Ambulatory Visit | Attending: Internal Medicine | Admitting: Internal Medicine

## 2018-07-07 LAB — POCT HEMOGLOBIN-HEMACUE: Hemoglobin: 16 g/dL (ref 13.0–17.0)

## 2018-07-07 MED ORDER — LIDOCAINE HCL 1 % IJ SOLN
2.0000 mL | Freq: Once | INTRAMUSCULAR | Status: AC
  Administered 2018-07-07: 2 mL via INTRADERMAL

## 2018-07-07 MED ORDER — LIDOCAINE HCL (PF) 1 % IJ SOLN
INTRAMUSCULAR | Status: AC
  Administered 2018-07-07: 2 mL via INTRADERMAL
  Filled 2018-07-07: qty 2

## 2018-07-07 NOTE — Progress Notes (Signed)
Pt arrived at 1200 for therapeutic phlebotomy. Hemocue 16, 500 ml removed per MD order from right antecubital area. Pt tolerated well.

## 2018-09-09 DIAGNOSIS — E7800 Pure hypercholesterolemia, unspecified: Secondary | ICD-10-CM | POA: Diagnosis not present

## 2018-09-09 DIAGNOSIS — I10 Essential (primary) hypertension: Secondary | ICD-10-CM | POA: Diagnosis not present

## 2018-09-09 DIAGNOSIS — R05 Cough: Secondary | ICD-10-CM | POA: Diagnosis not present

## 2018-12-17 ENCOUNTER — Other Ambulatory Visit (HOSPITAL_COMMUNITY): Payer: Self-pay | Admitting: *Deleted

## 2018-12-18 ENCOUNTER — Encounter (HOSPITAL_COMMUNITY)
Admission: RE | Admit: 2018-12-18 | Discharge: 2018-12-18 | Disposition: A | Discharge status: Home / Self Care | Payer: Medicare Other | Source: Ambulatory Visit | Attending: Internal Medicine | Admitting: Internal Medicine

## 2018-12-18 MED ORDER — LIDOCAINE HCL (PF) 1 % IJ SOLN
INTRAMUSCULAR | Status: AC
  Administered 2018-12-18: 10:00:00 2 mL via INTRADERMAL
  Filled 2018-12-18: qty 2

## 2018-12-18 MED ORDER — LIDOCAINE HCL (PF) 1 % IJ SOLN
2.0000 mL | Freq: Once | INTRAMUSCULAR | Status: DC
  Administered 2018-12-18: 10:00:00 2 mL via INTRADERMAL

## 2018-12-18 NOTE — Progress Notes (Signed)
Pt here for therapeutic phlebotomy per MD order. Hemocue 16.2 today. 500 ml blood removed from right AC area. Pt tolerated well. VSS. DC home at 1040.

## 2018-12-19 LAB — POCT HEMOGLOBIN-HEMACUE: Hemoglobin: 16.2 g/dL (ref 13.0–17.0)

## 2019-02-17 ENCOUNTER — Other Ambulatory Visit: Payer: Self-pay

## 2019-02-17 ENCOUNTER — Encounter (HOSPITAL_COMMUNITY)
Admission: RE | Admit: 2019-02-17 | Discharge: 2019-02-17 | Disposition: A | Discharge status: Home / Self Care | Payer: Medicare Other | Source: Ambulatory Visit | Attending: Internal Medicine | Admitting: Internal Medicine

## 2019-02-17 LAB — POCT HEMOGLOBIN-HEMACUE: Hemoglobin: 15 g/dL (ref 13.0–17.0)

## 2019-02-17 MED ORDER — LIDOCAINE HCL (PF) 1 % IJ SOLN
2.0000 mL | Freq: Once | INTRAMUSCULAR | Status: AC
  Administered 2019-02-17: 2 mL via INTRADERMAL

## 2019-02-17 MED ORDER — LIDOCAINE HCL (PF) 1 % IJ SOLN
INTRAMUSCULAR | Status: AC
  Filled 2019-02-17: qty 2

## 2019-03-11 DIAGNOSIS — Z0000 Encounter for general adult medical examination without abnormal findings: Secondary | ICD-10-CM | POA: Diagnosis not present

## 2019-03-11 DIAGNOSIS — I10 Essential (primary) hypertension: Secondary | ICD-10-CM | POA: Diagnosis not present

## 2019-03-11 DIAGNOSIS — Z125 Encounter for screening for malignant neoplasm of prostate: Secondary | ICD-10-CM | POA: Diagnosis not present

## 2019-03-11 DIAGNOSIS — N400 Enlarged prostate without lower urinary tract symptoms: Secondary | ICD-10-CM | POA: Diagnosis not present

## 2019-03-11 DIAGNOSIS — Z86010 Personal history of colonic polyps: Secondary | ICD-10-CM | POA: Diagnosis not present

## 2019-03-11 DIAGNOSIS — E7800 Pure hypercholesterolemia, unspecified: Secondary | ICD-10-CM | POA: Diagnosis not present

## 2019-03-12 DIAGNOSIS — I10 Essential (primary) hypertension: Secondary | ICD-10-CM | POA: Diagnosis not present

## 2019-03-12 DIAGNOSIS — Z125 Encounter for screening for malignant neoplasm of prostate: Secondary | ICD-10-CM | POA: Diagnosis not present

## 2019-05-05 IMAGING — CR DG ANKLE COMPLETE 3+V*R*
3 series · 3 of 3 positions shown · non-contrast
Comparison: None.

CLINICAL DATA: Right foot ankle pain and swelling. Fall 4 days
prior.

EXAM:
RIGHT ANKLE - COMPLETE 3+ VIEW

[x ankle ap right]
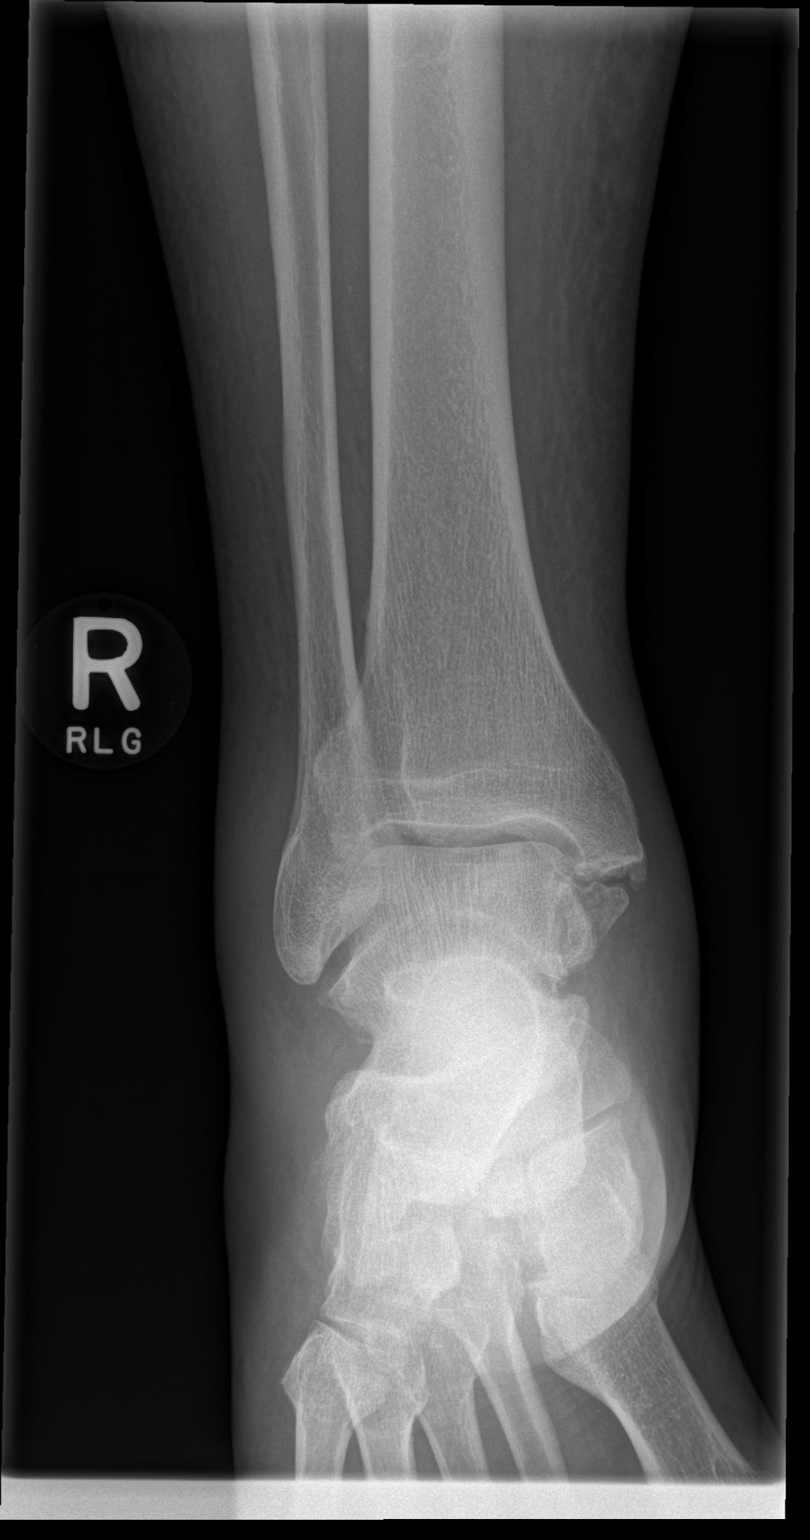

[x ankle obl right]
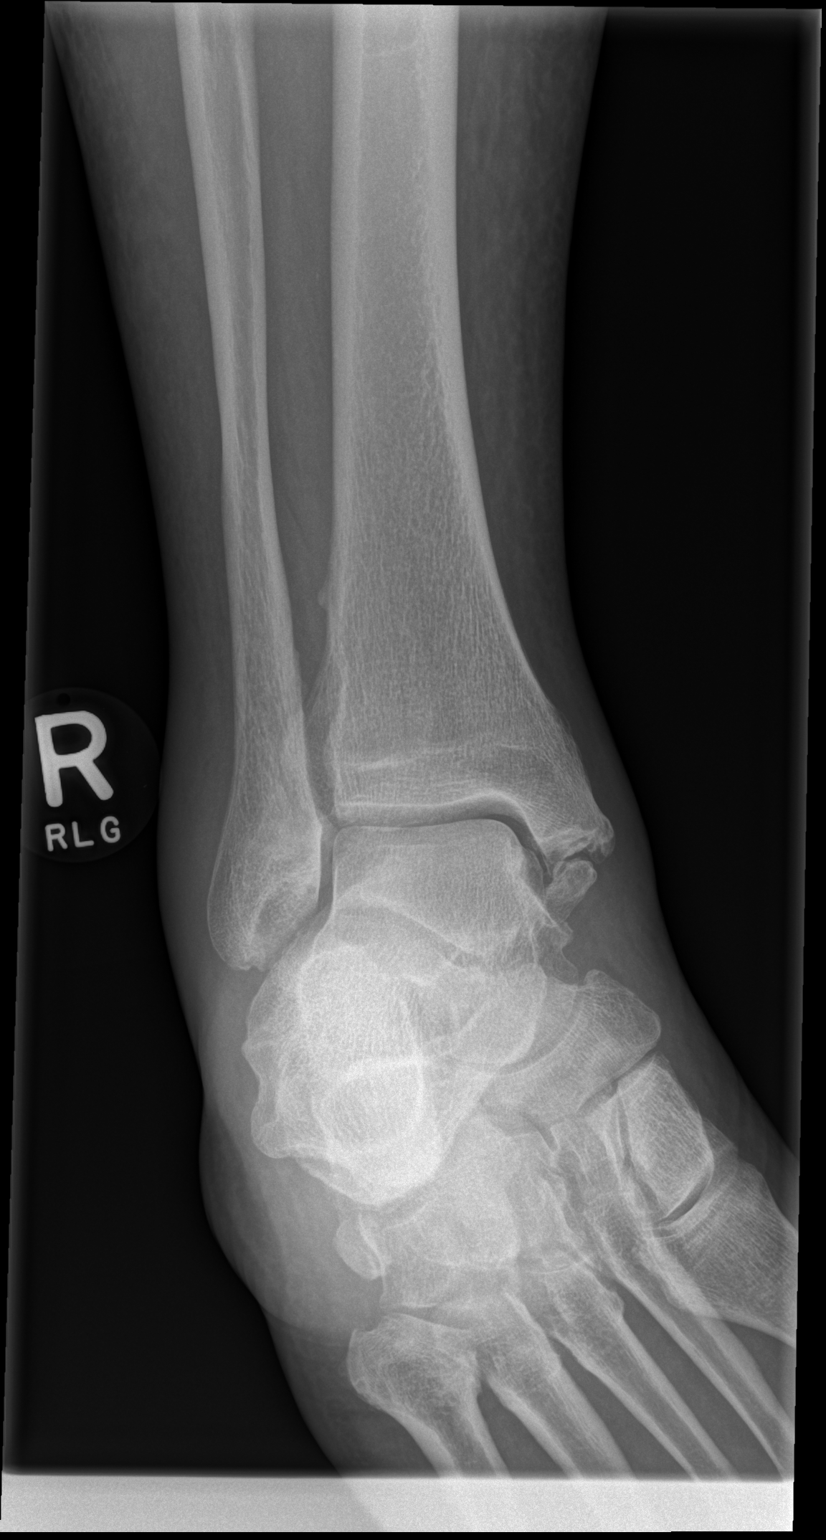

[x ankle lat right]
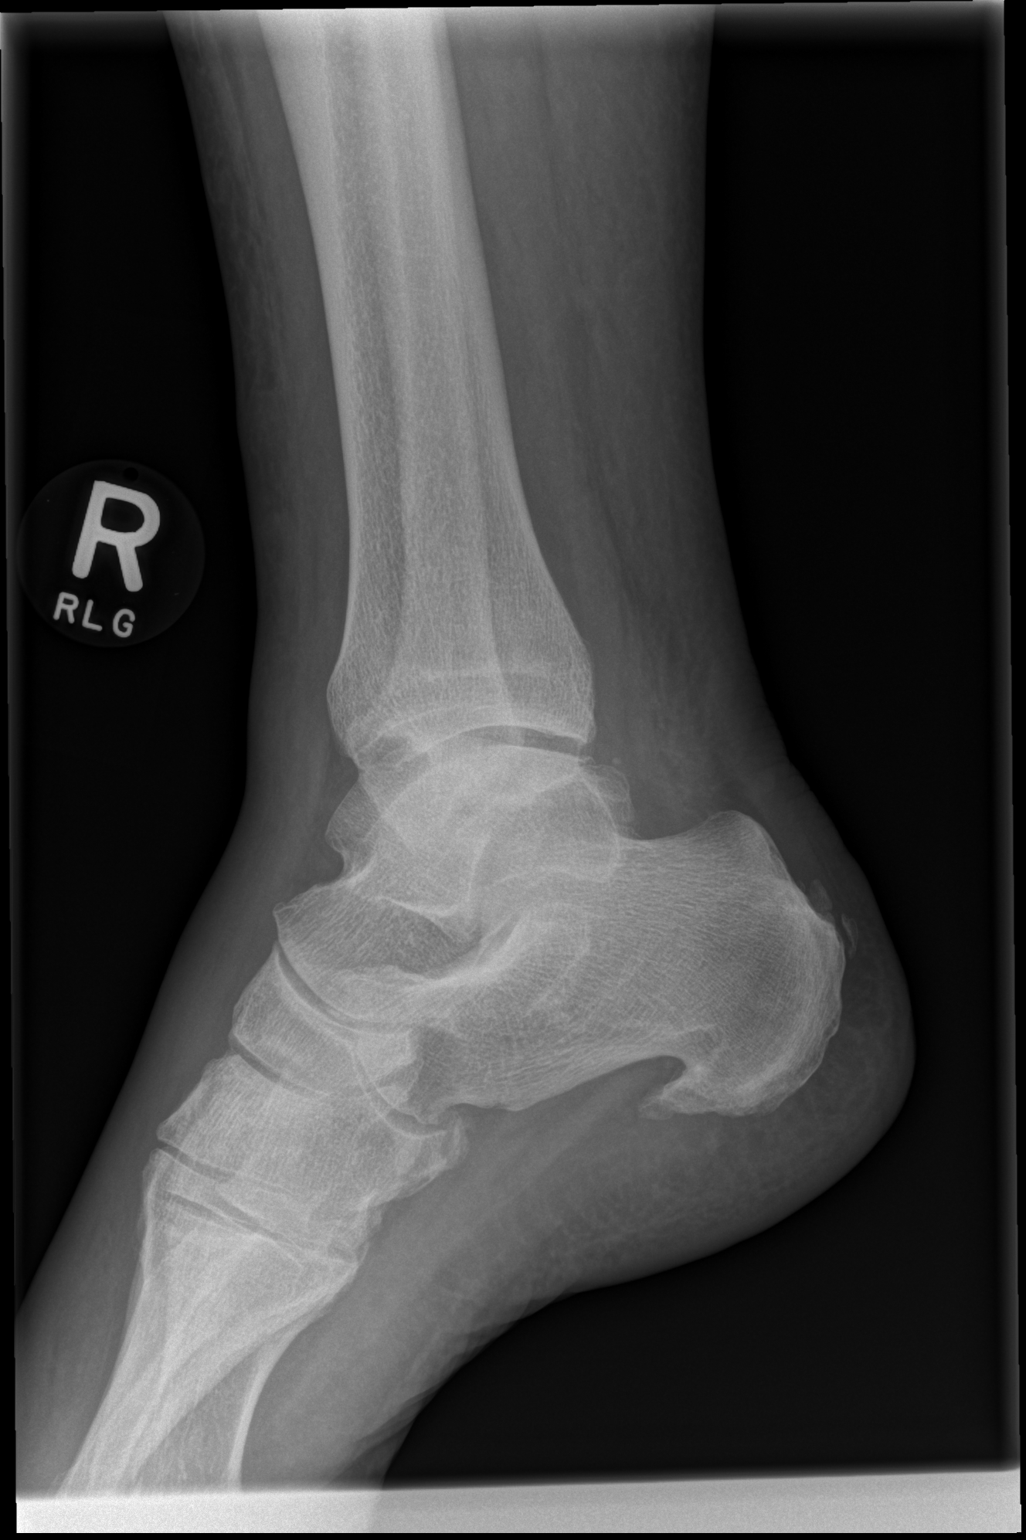

[3 of 3 positions shown; findings below may reference images not displayed]

FINDINGS: There is no evidence of acute fracture, dislocation, or joint
effusion. Well corticated osseous density adjacent to the medial
malleolus may be sequela of remote prior injury or an accessory
ossicle. Diffuse soft tissue edema.
IMPRESSION: Soft tissue edema without acute osseous abnormality. No acute
fracture.

## 2019-05-12 DIAGNOSIS — Z23 Encounter for immunization: Secondary | ICD-10-CM | POA: Diagnosis not present

## 2019-06-01 ENCOUNTER — Other Ambulatory Visit (HOSPITAL_COMMUNITY): Payer: Self-pay | Admitting: *Deleted

## 2019-06-02 ENCOUNTER — Other Ambulatory Visit: Payer: Self-pay

## 2019-06-02 ENCOUNTER — Ambulatory Visit (HOSPITAL_COMMUNITY)
Admission: RE | Admit: 2019-06-02 | Discharge: 2019-06-02 | Disposition: A | Discharge status: Home / Self Care | Payer: Medicare Other | Source: Ambulatory Visit | Attending: Internal Medicine | Admitting: Internal Medicine

## 2019-06-02 LAB — POCT HEMOGLOBIN-HEMACUE: Hemoglobin: 15.6 g/dL (ref 13.0–17.0)

## 2019-06-02 MED ORDER — LIDOCAINE HCL 2 % IJ SOLN: 0.1000 mL | Freq: Once | INTRAMUSCULAR | Status: DC

## 2019-06-02 MED ORDER — LIDOCAINE HCL (PF) 1 % IJ SOLN
INTRAMUSCULAR | Status: AC
  Administered 2019-06-02: 14:00:00 2 mL via INTRADERMAL
  Filled 2019-06-02: qty 2

## 2019-08-10 ENCOUNTER — Other Ambulatory Visit (HOSPITAL_COMMUNITY): Payer: Self-pay | Admitting: *Deleted

## 2019-08-11 ENCOUNTER — Other Ambulatory Visit: Payer: Self-pay

## 2019-08-11 ENCOUNTER — Encounter (HOSPITAL_COMMUNITY)
Admission: RE | Admit: 2019-08-11 | Discharge: 2019-08-11 | Disposition: A | Discharge status: Home / Self Care | Payer: Medicare Other | Source: Ambulatory Visit | Attending: Internal Medicine | Admitting: Internal Medicine

## 2019-08-11 MED ORDER — LIDOCAINE HCL 2 % IJ SOLN: 0.1000 mL | Freq: Once | INTRAMUSCULAR | Status: DC

## 2019-08-14 ENCOUNTER — Ambulatory Visit: Payer: Medicare Other | Attending: Internal Medicine

## 2019-08-14 ENCOUNTER — Other Ambulatory Visit: Payer: Self-pay

## 2019-08-14 DIAGNOSIS — Z23 Encounter for immunization: Secondary | ICD-10-CM | POA: Diagnosis not present

## 2019-08-14 NOTE — Progress Notes (Signed)
   Covid-19 Vaccination Clinic  Name:  Drew Calderon    MRN: [id number] DOB: Mar 13, 1941  08/14/2019  Drew Calderon was observed post Covid-19 immunization for 15 minutes without incidence. He was provided with Vaccine Information Sheet and instruction to access the V-Safe system.   Drew Calderon was instructed to call 911 with any severe reactions post vaccine: Marland Kitchen Difficulty breathing  . Swelling of your face and throat  . A fast heartbeat  . A bad rash all over your body  . Dizziness and weakness    Immunizations Administered    Name Date Dose VIS Date Route   Pfizer COVID-19 Vaccine 08/14/2019 12:36 PM 0.3 mL 07/10/2019 Intramuscular   Manufacturer: [place]   Lot: [id number]   [place]: [id number]

## 2019-09-03 ENCOUNTER — Ambulatory Visit: Payer: Medicare Other | Attending: Internal Medicine

## 2019-09-03 DIAGNOSIS — Z23 Encounter for immunization: Secondary | ICD-10-CM | POA: Insufficient documentation

## 2019-09-03 NOTE — Progress Notes (Signed)
   Covid-19 Vaccination Clinic  Name:  Drew Calderon    MRN: [id number] DOB: [birth year]  09/03/2019  [name] was observed post Covid-19 immunization for 15 minutes without incidence. He was provided with Vaccine Information Sheet and instruction to access the V-Safe system.   [name] was instructed to call 911 with any severe reactions post vaccine: Marland Kitchen Difficulty breathing  . Swelling of your face and throat  . A fast heartbeat  . A bad rash all over your body  . Dizziness and weakness    Immunizations Administered    Name Date Dose VIS Date Route   Pfizer COVID-19 Vaccine 09/03/2019 12:38 PM 0.3 mL 07/10/2019 Intramuscular   Manufacturer: [place]   Lot: [id number]   [place]: [id number]

## 2019-09-10 DIAGNOSIS — E7800 Pure hypercholesterolemia, unspecified: Secondary | ICD-10-CM | POA: Diagnosis not present

## 2019-09-10 DIAGNOSIS — I10 Essential (primary) hypertension: Secondary | ICD-10-CM | POA: Diagnosis not present

## 2019-10-08 DIAGNOSIS — I10 Essential (primary) hypertension: Secondary | ICD-10-CM | POA: Diagnosis not present

## 2019-10-08 DIAGNOSIS — I83893 Varicose veins of bilateral lower extremities with other complications: Secondary | ICD-10-CM | POA: Diagnosis not present

## 2019-10-08 DIAGNOSIS — J45909 Unspecified asthma, uncomplicated: Secondary | ICD-10-CM | POA: Diagnosis not present

## 2019-10-08 DIAGNOSIS — R600 Localized edema: Secondary | ICD-10-CM | POA: Diagnosis not present

## 2019-11-09 ENCOUNTER — Other Ambulatory Visit (HOSPITAL_COMMUNITY): Payer: Self-pay | Admitting: *Deleted

## 2019-11-10 ENCOUNTER — Other Ambulatory Visit: Payer: Self-pay

## 2019-11-10 ENCOUNTER — Ambulatory Visit (HOSPITAL_COMMUNITY)
Admission: RE | Admit: 2019-11-10 | Discharge: 2019-11-10 | Disposition: A | Discharge status: Home / Self Care | Payer: Medicare Other | Source: Ambulatory Visit | Attending: Internal Medicine | Admitting: Internal Medicine

## 2019-11-10 LAB — POCT HEMOGLOBIN-HEMACUE: Hemoglobin: 15.3 g/dL (ref 13.0–17.0)

## 2019-11-10 MED ORDER — LIDOCAINE HCL 2 % IJ SOLN: 0.1000 mL | Freq: Once | INTRAMUSCULAR | Status: DC

## 2019-11-10 MED ORDER — LIDOCAINE HCL (PF) 1 % IJ SOLN
INTRAMUSCULAR | Status: AC
  Administered 2019-11-10: 2 mL via SUBCUTANEOUS
  Filled 2019-11-10: qty 2

## 2019-11-24 DIAGNOSIS — R600 Localized edema: Secondary | ICD-10-CM | POA: Diagnosis not present

## 2019-11-24 DIAGNOSIS — I10 Essential (primary) hypertension: Secondary | ICD-10-CM | POA: Diagnosis not present

## 2019-11-24 DIAGNOSIS — Z6831 Body mass index (BMI) 31.0-31.9, adult: Secondary | ICD-10-CM | POA: Diagnosis not present

## 2020-02-15 ENCOUNTER — Other Ambulatory Visit (HOSPITAL_COMMUNITY): Payer: Self-pay

## 2020-02-16 ENCOUNTER — Other Ambulatory Visit: Payer: Self-pay

## 2020-02-16 ENCOUNTER — Ambulatory Visit (HOSPITAL_COMMUNITY)
Admission: RE | Admit: 2020-02-16 | Discharge: 2020-02-16 | Disposition: A | Discharge status: Home / Self Care | Payer: Medicare Other | Source: Ambulatory Visit | Attending: Internal Medicine | Admitting: Internal Medicine

## 2020-02-16 LAB — POCT HEMOGLOBIN-HEMACUE: Hemoglobin: 16.5 g/dL (ref 13.0–17.0)

## 2020-02-16 MED ORDER — LIDOCAINE HCL 2 % IJ SOLN: 0.1000 mL | Freq: Once | INTRAMUSCULAR | Status: DC

## 2020-02-16 MED ORDER — LIDOCAINE HCL (PF) 1 % IJ SOLN
INTRAMUSCULAR | Status: AC
  Administered 2020-02-16: 2 mL
  Filled 2020-02-16: qty 2

## 2020-02-16 NOTE — Progress Notes (Signed)
Pt here for therapeutic phlebotomy. Hemocue 16.5. 500 ml removed per MD order from right antecubital area. Pt tolerated well. VSS. DC home at 1106.

## 2020-03-10 DIAGNOSIS — Z1389 Encounter for screening for other disorder: Secondary | ICD-10-CM | POA: Diagnosis not present

## 2020-03-10 DIAGNOSIS — Z86010 Personal history of colonic polyps: Secondary | ICD-10-CM | POA: Diagnosis not present

## 2020-03-10 DIAGNOSIS — N400 Enlarged prostate without lower urinary tract symptoms: Secondary | ICD-10-CM | POA: Diagnosis not present

## 2020-03-10 DIAGNOSIS — R05 Cough: Secondary | ICD-10-CM | POA: Diagnosis not present

## 2020-03-10 DIAGNOSIS — J45909 Unspecified asthma, uncomplicated: Secondary | ICD-10-CM | POA: Diagnosis not present

## 2020-03-10 DIAGNOSIS — Z0000 Encounter for general adult medical examination without abnormal findings: Secondary | ICD-10-CM | POA: Diagnosis not present

## 2020-03-10 DIAGNOSIS — E7800 Pure hypercholesterolemia, unspecified: Secondary | ICD-10-CM | POA: Diagnosis not present

## 2020-03-10 DIAGNOSIS — Z7189 Other specified counseling: Secondary | ICD-10-CM | POA: Diagnosis not present

## 2020-03-10 DIAGNOSIS — I83893 Varicose veins of bilateral lower extremities with other complications: Secondary | ICD-10-CM | POA: Diagnosis not present

## 2020-03-10 DIAGNOSIS — I10 Essential (primary) hypertension: Secondary | ICD-10-CM | POA: Diagnosis not present

## 2020-03-10 DIAGNOSIS — E669 Obesity, unspecified: Secondary | ICD-10-CM | POA: Diagnosis not present

## 2020-04-14 ENCOUNTER — Ambulatory Visit (HOSPITAL_COMMUNITY)
Admission: RE | Admit: 2020-04-14 | Discharge: 2020-04-14 | Disposition: A | Discharge status: Home / Self Care | Payer: Medicare Other | Source: Ambulatory Visit | Attending: Internal Medicine | Admitting: Internal Medicine

## 2020-04-14 ENCOUNTER — Other Ambulatory Visit: Payer: Self-pay

## 2020-04-14 LAB — POCT HEMOGLOBIN-HEMACUE: Hemoglobin: 14.3 g/dL (ref 13.0–17.0)

## 2020-04-14 MED ORDER — LIDOCAINE HCL (PF) 1 % IJ SOLN
INTRAMUSCULAR | Status: AC
  Administered 2020-04-14: 0.5 mL via INTRADERMAL
  Filled 2020-04-14: qty 2

## 2020-04-14 MED ORDER — LIDOCAINE HCL (PF) 2 % IJ SOLN
INTRAMUSCULAR | Status: AC
  Filled 2020-04-14: qty 10

## 2020-04-29 DIAGNOSIS — Z23 Encounter for immunization: Secondary | ICD-10-CM | POA: Diagnosis not present

## 2020-05-31 DIAGNOSIS — Z23 Encounter for immunization: Secondary | ICD-10-CM | POA: Diagnosis not present

## 2020-08-15 ENCOUNTER — Other Ambulatory Visit (HOSPITAL_COMMUNITY): Payer: Self-pay

## 2020-08-16 ENCOUNTER — Encounter (HOSPITAL_COMMUNITY): Payer: Medicare Other

## 2020-09-02 ENCOUNTER — Ambulatory Visit (HOSPITAL_COMMUNITY)
Admission: RE | Admit: 2020-09-02 | Discharge: 2020-09-02 | Disposition: A | Discharge status: Home / Self Care | Payer: Medicare Other | Source: Ambulatory Visit | Attending: Internal Medicine | Admitting: Internal Medicine

## 2020-09-02 ENCOUNTER — Other Ambulatory Visit: Payer: Self-pay

## 2020-09-02 LAB — POCT HEMOGLOBIN-HEMACUE: Hemoglobin: 13.9 g/dL (ref 13.0–17.0)

## 2020-09-02 MED ORDER — LIDOCAINE HCL (PF) 1 % IJ SOLN
INTRAMUSCULAR | Status: AC
  Filled 2020-09-02: qty 2

## 2020-09-02 MED ORDER — LIDOCAINE HCL 2 % IJ SOLN: 0.1000 mL | Freq: Once | INTRAMUSCULAR | Status: DC

## 2020-09-29 DIAGNOSIS — I10 Essential (primary) hypertension: Secondary | ICD-10-CM | POA: Diagnosis not present

## 2020-09-29 DIAGNOSIS — I83893 Varicose veins of bilateral lower extremities with other complications: Secondary | ICD-10-CM | POA: Diagnosis not present

## 2020-11-20 DIAGNOSIS — Z23 Encounter for immunization: Secondary | ICD-10-CM | POA: Diagnosis not present

## 2020-11-29 DIAGNOSIS — H25043 Posterior subcapsular polar age-related cataract, bilateral: Secondary | ICD-10-CM | POA: Diagnosis not present

## 2020-11-29 DIAGNOSIS — H0288A Meibomian gland dysfunction right eye, upper and lower eyelids: Secondary | ICD-10-CM | POA: Diagnosis not present

## 2020-11-29 DIAGNOSIS — H2513 Age-related nuclear cataract, bilateral: Secondary | ICD-10-CM | POA: Diagnosis not present

## 2020-12-01 ENCOUNTER — Ambulatory Visit: Payer: Medicare Other | Attending: Critical Care Medicine

## 2020-12-01 DIAGNOSIS — Z20822 Contact with and (suspected) exposure to covid-19: Secondary | ICD-10-CM

## 2020-12-03 LAB — NOVEL CORONAVIRUS, NAA: SARS-CoV-2, NAA: NOT DETECTED

## 2020-12-03 LAB — SARS-COV-2, NAA 2 DAY TAT

## 2020-12-12 DIAGNOSIS — H0288A Meibomian gland dysfunction right eye, upper and lower eyelids: Secondary | ICD-10-CM | POA: Diagnosis not present

## 2020-12-12 DIAGNOSIS — H0288B Meibomian gland dysfunction left eye, upper and lower eyelids: Secondary | ICD-10-CM | POA: Diagnosis not present

## 2020-12-12 DIAGNOSIS — H16223 Keratoconjunctivitis sicca, not specified as Sjogren's, bilateral: Secondary | ICD-10-CM | POA: Diagnosis not present

## 2021-01-10 DIAGNOSIS — H0288A Meibomian gland dysfunction right eye, upper and lower eyelids: Secondary | ICD-10-CM | POA: Diagnosis not present

## 2021-01-10 DIAGNOSIS — H16223 Keratoconjunctivitis sicca, not specified as Sjogren's, bilateral: Secondary | ICD-10-CM | POA: Diagnosis not present

## 2021-01-10 DIAGNOSIS — H0288B Meibomian gland dysfunction left eye, upper and lower eyelids: Secondary | ICD-10-CM | POA: Diagnosis not present

## 2021-01-13 ENCOUNTER — Other Ambulatory Visit (HOSPITAL_COMMUNITY): Payer: Self-pay | Admitting: *Deleted

## 2021-01-17 ENCOUNTER — Ambulatory Visit (HOSPITAL_COMMUNITY)
Admission: RE | Admit: 2021-01-17 | Discharge: 2021-01-17 | Disposition: A | Discharge status: Home / Self Care | Payer: Medicare Other | Source: Ambulatory Visit | Attending: Internal Medicine | Admitting: Internal Medicine

## 2021-01-17 ENCOUNTER — Other Ambulatory Visit: Payer: Self-pay

## 2021-01-17 LAB — POCT HEMOGLOBIN-HEMACUE: Hemoglobin: 14.9 g/dL (ref 13.0–17.0)

## 2021-01-17 MED ORDER — LIDOCAINE HCL (PF) 1 % IJ SOLN
2.0000 mL | Freq: Once | INTRAMUSCULAR | Status: AC
  Administered 2021-01-17: 2 mL via INTRADERMAL

## 2021-01-17 MED ORDER — LIDOCAINE HCL (PF) 1 % IJ SOLN
INTRAMUSCULAR | Status: AC
  Filled 2021-01-17: qty 2

## 2021-01-17 NOTE — Progress Notes (Signed)
Pt here for therapeutic phlebotomy.  HGB 14.9 via hemocue.  Lidocaine used to right a/c area and approx 500cc blood obtained via right a/c.  Pt tolerated procedure with no complications

## 2021-03-01 DIAGNOSIS — H16223 Keratoconjunctivitis sicca, not specified as Sjogren's, bilateral: Secondary | ICD-10-CM | POA: Diagnosis not present

## 2021-03-01 DIAGNOSIS — H527 Unspecified disorder of refraction: Secondary | ICD-10-CM | POA: Diagnosis not present

## 2021-03-01 DIAGNOSIS — H0100B Unspecified blepharitis left eye, upper and lower eyelids: Secondary | ICD-10-CM | POA: Diagnosis not present

## 2021-03-01 DIAGNOSIS — H0100A Unspecified blepharitis right eye, upper and lower eyelids: Secondary | ICD-10-CM | POA: Diagnosis not present

## 2021-03-01 DIAGNOSIS — H52203 Unspecified astigmatism, bilateral: Secondary | ICD-10-CM | POA: Diagnosis not present

## 2021-03-01 DIAGNOSIS — H25813 Combined forms of age-related cataract, bilateral: Secondary | ICD-10-CM | POA: Diagnosis not present

## 2021-03-01 DIAGNOSIS — H3561 Retinal hemorrhage, right eye: Secondary | ICD-10-CM | POA: Diagnosis not present

## 2021-03-01 DIAGNOSIS — H40033 Anatomical narrow angle, bilateral: Secondary | ICD-10-CM | POA: Diagnosis not present

## 2021-03-16 DIAGNOSIS — Z23 Encounter for immunization: Secondary | ICD-10-CM | POA: Diagnosis not present

## 2021-03-16 DIAGNOSIS — E669 Obesity, unspecified: Secondary | ICD-10-CM | POA: Diagnosis not present

## 2021-03-16 DIAGNOSIS — N400 Enlarged prostate without lower urinary tract symptoms: Secondary | ICD-10-CM | POA: Diagnosis not present

## 2021-03-16 DIAGNOSIS — E7800 Pure hypercholesterolemia, unspecified: Secondary | ICD-10-CM | POA: Diagnosis not present

## 2021-03-16 DIAGNOSIS — Z86010 Personal history of colonic polyps: Secondary | ICD-10-CM | POA: Diagnosis not present

## 2021-03-16 DIAGNOSIS — Z0000 Encounter for general adult medical examination without abnormal findings: Secondary | ICD-10-CM | POA: Diagnosis not present

## 2021-03-16 DIAGNOSIS — I10 Essential (primary) hypertension: Secondary | ICD-10-CM | POA: Diagnosis not present

## 2021-03-16 DIAGNOSIS — I83893 Varicose veins of bilateral lower extremities with other complications: Secondary | ICD-10-CM | POA: Diagnosis not present

## 2021-03-16 DIAGNOSIS — Z1389 Encounter for screening for other disorder: Secondary | ICD-10-CM | POA: Diagnosis not present

## 2021-04-10 DIAGNOSIS — H25813 Combined forms of age-related cataract, bilateral: Secondary | ICD-10-CM | POA: Diagnosis not present

## 2021-04-14 ENCOUNTER — Emergency Department (HOSPITAL_COMMUNITY)
Admission: EM | Admit: 2021-04-14 | Discharge: 2021-04-15 | Disposition: A | Discharge status: Home / Self Care | Payer: Medicare Other | Attending: Emergency Medicine | Admitting: Emergency Medicine

## 2021-04-14 ENCOUNTER — Encounter (HOSPITAL_COMMUNITY): Payer: Self-pay | Admitting: Emergency Medicine

## 2021-04-14 ENCOUNTER — Other Ambulatory Visit: Payer: Self-pay

## 2021-04-14 DIAGNOSIS — Y9301 Activity, walking, marching and hiking: Secondary | ICD-10-CM | POA: Insufficient documentation

## 2021-04-14 DIAGNOSIS — W228XXA Striking against or struck by other objects, initial encounter: Secondary | ICD-10-CM | POA: Diagnosis not present

## 2021-04-14 DIAGNOSIS — S81812A Laceration without foreign body, left lower leg, initial encounter: Secondary | ICD-10-CM | POA: Insufficient documentation

## 2021-04-14 DIAGNOSIS — Z23 Encounter for immunization: Secondary | ICD-10-CM | POA: Diagnosis not present

## 2021-04-14 DIAGNOSIS — Z87891 Personal history of nicotine dependence: Secondary | ICD-10-CM | POA: Insufficient documentation

## 2021-04-14 DIAGNOSIS — Z79899 Other long term (current) drug therapy: Secondary | ICD-10-CM | POA: Insufficient documentation

## 2021-04-14 DIAGNOSIS — I10 Essential (primary) hypertension: Secondary | ICD-10-CM | POA: Insufficient documentation

## 2021-04-14 DIAGNOSIS — Z7982 Long term (current) use of aspirin: Secondary | ICD-10-CM | POA: Diagnosis not present

## 2021-04-14 DIAGNOSIS — S8992XA Unspecified injury of left lower leg, initial encounter: Secondary | ICD-10-CM | POA: Diagnosis present

## 2021-04-14 MED ORDER — LIDOCAINE HCL (PF) 1 % IJ SOLN
5.0000 mL | Freq: Once | INTRAMUSCULAR | Status: AC
  Administered 2021-04-15: 5 mL
  Filled 2021-04-14: qty 30

## 2021-04-14 MED ORDER — TETANUS-DIPHTH-ACELL PERTUSSIS 5-2.5-18.5 LF-MCG/0.5 IM SUSY
0.5000 mL | PREFILLED_SYRINGE | Freq: Once | INTRAMUSCULAR | Status: AC
  Administered 2021-04-15: 0.5 mL via INTRAMUSCULAR
  Filled 2021-04-14: qty 0.5

## 2021-04-14 NOTE — ED Provider Notes (Signed)
[place] DEPT Provider Note   CSN: [id number] Arrival date & time: 04/14/21  2248     History Chief Complaint  Patient presents with   Laceration    Drew Calderon is a 80 y.o. male.  The history is provided by the patient.  Laceration Location:  Leg Leg laceration location:  L lower leg Length:  4 cm Depth:  Cutaneous Quality: straight   Quality comment:  V shaped Bleeding: controlled   Time since incident:  1 hour Injury mechanism: edge of a table. Pain details:    Quality:  Throbbing   Severity:  Mild   Timing:  Constant   Progression:  Unchanged Foreign body present:  No foreign bodies Relieved by:  Nothing Worsened by:  Nothing Ineffective treatments:  None tried Tetanus status:  Up to date Associated symptoms: no fever, no focal weakness, no numbness, no rash and no swelling       Past Medical History:  Diagnosis Date   Hypertension     There are no problems to display for this patient.   History reviewed. No pertinent surgical history.     No family history on file.  Social History   Tobacco Use   Smoking status: Former   Smokeless tobacco: Never  Substance Use Topics   Alcohol use: Yes    Alcohol/week: 2.0 standard drinks    Types: 2 Glasses of wine per week    Comment: 2 glasses wine daily    Drug use: No    Home Medications Prior to Admission medications   Medication Sig Start Date End Date Taking? Authorizing Provider  amLODipine (NORVASC) 5 MG tablet Take 5 mg by mouth daily. 05/20/15   [provider]  aspirin EC 81 MG tablet Take 81 mg by mouth daily.    [provider]  atorvastatin (LIPITOR) 10 MG tablet Take 10 mg by mouth daily. 05/20/15   [provider]  cephALEXin (KEFLEX) 500 MG capsule Take 1 capsule (500 mg total) by mouth 4 (four) times daily. Patient not taking: Reported on 04/12/2017 06/26/15   [name], MD  doxycycline (VIBRAMYCIN) 100 MG capsule  Take 1 capsule (100 mg total) by mouth 2 (two) times daily. 04/12/17   [name], PA-C  ECHINACEA PO Take 1 tablet by mouth daily.    [provider]  HYDROcodone-acetaminophen (NORCO/VICODIN) 5-325 MG tablet Take 1-2 tablets by mouth every 4 (four) hours as needed. Patient not taking: Reported on 04/12/2017 06/26/15   [name], MD  OVER THE COUNTER MEDICATION Take 1 tablet by mouth 2 (two) times daily. Antioxidant multi vitamin    [provider]    Allergies    Patient has no known allergies.  Review of Systems   Review of Systems  Constitutional:  Negative for chills and fever.  HENT:  Negative for ear pain and sore throat.   Eyes:  Negative for pain and visual disturbance.  Respiratory:  Negative for cough and shortness of breath.   Cardiovascular:  Negative for chest pain and palpitations.  Gastrointestinal:  Negative for abdominal pain and vomiting.  Genitourinary:  Negative for dysuria and hematuria.  Musculoskeletal:  Negative for arthralgias and back pain.  Skin:  Positive for wound. Negative for color change and rash.  Neurological:  Negative for focal weakness, seizures and syncope.  All other systems reviewed and are negative.  Physical Exam Updated Vital Signs BP 129/87 (BP Location: Left Arm)   Pulse 75  Temp 97.6 F (36.4 C) (Oral)   Resp 17   Ht 6' (1.829 m)   Wt 95.7 kg   SpO2 95%   BMI 28.62 kg/m   Physical Exam Vitals and nursing note reviewed.  Constitutional:      Appearance: Normal appearance.  HENT:     Head: Normocephalic and atraumatic.  Eyes:     Conjunctiva/sclera: Conjunctivae normal.  Pulmonary:     Effort: Pulmonary effort is normal. No respiratory distress.  Musculoskeletal:        General: No deformity. Normal range of motion.     Cervical back: Normal range of motion.  Skin:    General: Skin is warm and dry.     Comments: V-shaped laceration with the apex of the V pointing towards the anterior tibial  surface.  Laceration is located at the midpoint of the lateral leg on the left side.  Bleeding is controlled.  The extremity is warm and well-perfused.  Neurological:     General: No focal deficit present.     Mental Status: He is alert and oriented to person, place, and time. Mental status is at baseline.  Psychiatric:        Mood and Affect: Mood normal.    ED Results / Procedures / Treatments   Labs (all labs ordered are listed, but only abnormal results are displayed) Labs Reviewed - No data to display  EKG None  Radiology No results found.  Procedures .Marland KitchenLaceration Repair  Date/Time: 04/15/2021 12:31 AM Performed by: Arnaldo Natal, MD Authorized by: Arnaldo Natal, MD   Consent:    Consent obtained:  Verbal   Consent given by:  Patient   Risks, benefits, and alternatives were discussed: yes     Risks discussed:  Infection, pain, need for additional repair, nerve damage, poor cosmetic result, poor wound healing and vascular damage   Alternatives discussed:  No treatment, delayed treatment, observation and referral Universal protocol:    Immediately prior to procedure, a time out was called: yes     Patient identity confirmed:  Verbally with patient Anesthesia:    Anesthesia method:  Local infiltration   Local anesthetic:  Lidocaine 1% w/o epi Laceration details:    Location:  Leg   Leg location:  L lower leg   Length (cm):  4   Depth (mm):  5 Pre-procedure details:    Preparation:  Patient was prepped and draped in usual sterile fashion Exploration:    Limited defect created (wound extended): no     Hemostasis achieved with:  Direct pressure   Wound exploration: wound explored through full range of motion and entire depth of wound visualized     Wound extent: no fascia violation noted, no foreign bodies/material noted, no muscle damage noted, no nerve damage noted, no tendon damage noted, no underlying fracture noted and no vascular damage noted     Contaminated:  no   Treatment:    Wound cleansed with: isopropyl alcohol.   Amount of cleaning:  Extensive   Irrigation solution:  Sterile saline   Irrigation volume:  100 cc   Irrigation method:  Syringe   Visualized foreign bodies/material removed: no     Debridement:  None   Undermining:  None   Scar revision: no   Skin repair:    Repair method:  Sutures   Suture size:  3-0   Suture material:  Prolene   Suture technique:  Simple interrupted   Number of sutures:  9 Approximation:  Approximation:  Close Repair type:    Repair type:  Simple Post-procedure details:    Dressing:  Non-adherent dressing   Procedure completion:  Tolerated well, no immediate complications   Medications Ordered in ED Medications  Tdap (BOOSTRIX) injection 0.5 mL (has no administration in time range)  lidocaine (PF) (XYLOCAINE) 1 % injection 5 mL (has no administration in time range)    ED Course  I have reviewed the triage vital signs and the nursing notes.  Pertinent labs & imaging results that were available during my care of the patient were reviewed by me and considered in my medical decision making (see chart for details).    MDM Rules/Calculators/A&P                           [name] sustained a laceration.  It was repaired.  He was counseled carefully on wound care and return precautions given the location of his wound at the left lower leg.   Final Clinical Impression(s) / ED Diagnoses Final diagnoses:  Laceration of left lower extremity, initial encounter    Rx / DC Orders ED Discharge Orders     None        Arnaldo Natal, MD 04/15/21 [phone]

## 2021-04-14 NOTE — ED Triage Notes (Signed)
Patient was walking when he accidentally kicked a table. Large, 4 in laceration to the lateral aspect of the LLE. Minimal bleeding. Patient is ambulatory.

## 2021-04-14 NOTE — ED Provider Notes (Signed)
Emergency Medicine Provider Triage Evaluation Note  Drew Calderon , a 80 y.o. male  was evaluated in triage.  Pt complains of laceration to left lower leg.  Patient states that he hit his leg on a wooden table.  He is a large laceration to the left anterior lower leg.  He denies any numbness or tingling to the lower extremity.  He is unsure if he had a tetanus shot in the last 5 years.  Review of Systems  Positive: Wound Negative: Numbness, tingling, decrease sensation.  Physical Exam  BP 129/87 (BP Location: Left Arm)   Pulse 75   Temp 97.6 F (36.4 C) (Oral)   Resp 17   SpO2 95%  Gen:   Awake, no distress   Resp:  Normal effort  MSK:   Moves extremities without difficulty  Other:  Pedal pulses 2+ and equal bilaterally.  Capillary refill less than 2 seconds bilaterally in lower extremities, sensation intact distal to injury. Large laceration to left anterior lower leg.  Does not appear to be very deep however it is approximately 5 cm and curved.  Do not think he needs imaging.  Tetanus shot ordered.  Medical Decision Making  Medically screening exam initiated at 11:16 PM.  Appropriate orders placed.  [name] was informed that the remainder of the evaluation will be completed by another provider, this initial triage assessment does not replace that evaluation, and the importance of remaining in the ED until their evaluation is complete.   Adolphus Birchwood, PA-C 04/14/21 2321    Lorelle Gibbs, DO 04/15/21 1541

## 2021-04-15 DIAGNOSIS — S81812A Laceration without foreign body, left lower leg, initial encounter: Secondary | ICD-10-CM | POA: Diagnosis not present

## 2021-04-18 DIAGNOSIS — H40033 Anatomical narrow angle, bilateral: Secondary | ICD-10-CM | POA: Diagnosis not present

## 2021-04-18 DIAGNOSIS — Z79899 Other long term (current) drug therapy: Secondary | ICD-10-CM | POA: Diagnosis not present

## 2021-04-18 DIAGNOSIS — H25813 Combined forms of age-related cataract, bilateral: Secondary | ICD-10-CM | POA: Diagnosis not present

## 2021-04-18 DIAGNOSIS — I10 Essential (primary) hypertension: Secondary | ICD-10-CM | POA: Diagnosis not present

## 2021-04-18 DIAGNOSIS — M3501 Sicca syndrome with keratoconjunctivitis: Secondary | ICD-10-CM | POA: Diagnosis not present

## 2021-04-18 DIAGNOSIS — E785 Hyperlipidemia, unspecified: Secondary | ICD-10-CM | POA: Diagnosis not present

## 2021-04-18 DIAGNOSIS — H52203 Unspecified astigmatism, bilateral: Secondary | ICD-10-CM | POA: Diagnosis not present

## 2021-04-18 DIAGNOSIS — H25811 Combined forms of age-related cataract, right eye: Secondary | ICD-10-CM | POA: Diagnosis not present

## 2021-04-18 DIAGNOSIS — Z7982 Long term (current) use of aspirin: Secondary | ICD-10-CM | POA: Diagnosis not present

## 2021-04-18 DIAGNOSIS — Z87891 Personal history of nicotine dependence: Secondary | ICD-10-CM | POA: Diagnosis not present

## 2021-05-04 DIAGNOSIS — Z23 Encounter for immunization: Secondary | ICD-10-CM | POA: Diagnosis not present

## 2021-05-10 ENCOUNTER — Other Ambulatory Visit (HOSPITAL_COMMUNITY): Payer: Self-pay | Admitting: *Deleted

## 2021-05-11 ENCOUNTER — Other Ambulatory Visit: Payer: Self-pay

## 2021-05-11 ENCOUNTER — Ambulatory Visit (HOSPITAL_COMMUNITY)
Admission: RE | Admit: 2021-05-11 | Discharge: 2021-05-11 | Disposition: A | Discharge status: Home / Self Care | Payer: Medicare Other | Source: Ambulatory Visit | Attending: Internal Medicine | Admitting: Internal Medicine

## 2021-05-11 LAB — POCT HEMOGLOBIN-HEMACUE: Hemoglobin: 16.6 g/dL (ref 13.0–17.0)

## 2021-05-11 MED ORDER — LIDOCAINE HCL (PF) 2 % IJ SOLN: 2.0000 mL | Freq: Once | INTRAMUSCULAR | Status: DC

## 2021-05-11 MED ORDER — LIDOCAINE HCL (PF) 1 % IJ SOLN
INTRAMUSCULAR | Status: AC
  Filled 2021-05-11: qty 2

## 2021-05-11 NOTE — Progress Notes (Signed)
Pt here at 1100 for therapeutic phlebotomy. Hemocue 16.6. 500 ml removed from left antecubital area. Pt tolerated well. VSS.

## 2021-05-12 DIAGNOSIS — S81802D Unspecified open wound, left lower leg, subsequent encounter: Secondary | ICD-10-CM | POA: Diagnosis not present

## 2021-05-12 DIAGNOSIS — I83893 Varicose veins of bilateral lower extremities with other complications: Secondary | ICD-10-CM | POA: Diagnosis not present

## 2021-05-12 DIAGNOSIS — I10 Essential (primary) hypertension: Secondary | ICD-10-CM | POA: Diagnosis not present

## 2021-05-16 DIAGNOSIS — Z9842 Cataract extraction status, left eye: Secondary | ICD-10-CM | POA: Diagnosis not present

## 2021-05-16 DIAGNOSIS — H40033 Anatomical narrow angle, bilateral: Secondary | ICD-10-CM | POA: Diagnosis not present

## 2021-05-16 DIAGNOSIS — Z79899 Other long term (current) drug therapy: Secondary | ICD-10-CM | POA: Diagnosis not present

## 2021-05-16 DIAGNOSIS — I10 Essential (primary) hypertension: Secondary | ICD-10-CM | POA: Diagnosis not present

## 2021-05-16 DIAGNOSIS — H52203 Unspecified astigmatism, bilateral: Secondary | ICD-10-CM | POA: Diagnosis not present

## 2021-05-16 DIAGNOSIS — E785 Hyperlipidemia, unspecified: Secondary | ICD-10-CM | POA: Diagnosis not present

## 2021-05-16 DIAGNOSIS — H25812 Combined forms of age-related cataract, left eye: Secondary | ICD-10-CM | POA: Diagnosis not present

## 2021-05-16 DIAGNOSIS — M3501 Sicca syndrome with keratoconjunctivitis: Secondary | ICD-10-CM | POA: Diagnosis not present

## 2021-05-16 DIAGNOSIS — Z87891 Personal history of nicotine dependence: Secondary | ICD-10-CM | POA: Diagnosis not present

## 2021-05-16 DIAGNOSIS — Z961 Presence of intraocular lens: Secondary | ICD-10-CM | POA: Diagnosis not present

## 2021-08-24 ENCOUNTER — Ambulatory Visit (HOSPITAL_COMMUNITY)
Admission: RE | Admit: 2021-08-24 | Discharge: 2021-08-24 | Disposition: A | Discharge status: Home / Self Care | Payer: Medicare Other | Source: Ambulatory Visit | Attending: Internal Medicine | Admitting: Internal Medicine

## 2021-08-24 LAB — POCT HEMOGLOBIN-HEMACUE: Hemoglobin: 16.8 g/dL (ref 13.0–17.0)

## 2021-08-24 MED ORDER — LIDOCAINE HCL (PF) 1 % IJ SOLN
INTRAMUSCULAR | Status: AC
  Administered 2021-08-24: 0.5 mL
  Filled 2021-08-24: qty 2

## 2021-08-24 MED ORDER — LIDOCAINE HCL (PF) 1 % IJ SOLN
INTRAMUSCULAR | Status: AC
  Filled 2021-08-24: qty 2

## 2021-08-24 NOTE — Progress Notes (Signed)
HemoCue 16.8. Phlebotomy preformed for 500cc successful

## 2021-09-14 DIAGNOSIS — I83893 Varicose veins of bilateral lower extremities with other complications: Secondary | ICD-10-CM | POA: Diagnosis not present

## 2021-09-14 DIAGNOSIS — D126 Benign neoplasm of colon, unspecified: Secondary | ICD-10-CM | POA: Diagnosis not present

## 2021-09-14 DIAGNOSIS — I10 Essential (primary) hypertension: Secondary | ICD-10-CM | POA: Diagnosis not present

## 2021-10-24 DIAGNOSIS — H59031 Cystoid macular edema following cataract surgery, right eye: Secondary | ICD-10-CM | POA: Diagnosis not present

## 2021-10-24 DIAGNOSIS — H16223 Keratoconjunctivitis sicca, not specified as Sjogren's, bilateral: Secondary | ICD-10-CM | POA: Diagnosis not present

## 2021-11-23 DIAGNOSIS — Z23 Encounter for immunization: Secondary | ICD-10-CM | POA: Diagnosis not present

## 2021-11-29 ENCOUNTER — Other Ambulatory Visit (HOSPITAL_COMMUNITY): Payer: Self-pay | Admitting: *Deleted

## 2021-11-30 ENCOUNTER — Ambulatory Visit (HOSPITAL_COMMUNITY)
Admission: RE | Admit: 2021-11-30 | Discharge: 2021-11-30 | Disposition: A | Discharge status: Home / Self Care | Payer: Medicare Other | Source: Ambulatory Visit | Attending: Internal Medicine | Admitting: Internal Medicine

## 2021-11-30 LAB — POCT HEMOGLOBIN-HEMACUE: Hemoglobin: 14.6 g/dL (ref 13.0–17.0)

## 2021-11-30 MED ORDER — LIDOCAINE HCL (PF) 1 % IJ SOLN
INTRAMUSCULAR | Status: AC
  Administered 2021-11-30: 2 mL via INTRADERMAL
  Filled 2021-11-30: qty 2

## 2021-11-30 MED ORDER — LIDOCAINE HCL (PF) 1 % IJ SOLN: 2.0000 mL | Freq: Once | INTRAMUSCULAR | Status: AC

## 2021-12-27 DIAGNOSIS — Z09 Encounter for follow-up examination after completed treatment for conditions other than malignant neoplasm: Secondary | ICD-10-CM | POA: Diagnosis not present

## 2021-12-27 DIAGNOSIS — K5730 Diverticulosis of large intestine without perforation or abscess without bleeding: Secondary | ICD-10-CM | POA: Diagnosis not present

## 2021-12-27 DIAGNOSIS — D123 Benign neoplasm of transverse colon: Secondary | ICD-10-CM | POA: Diagnosis not present

## 2021-12-27 DIAGNOSIS — D122 Benign neoplasm of ascending colon: Secondary | ICD-10-CM | POA: Diagnosis not present

## 2021-12-27 DIAGNOSIS — Z86010 Personal history of colonic polyps: Secondary | ICD-10-CM | POA: Diagnosis not present

## 2021-12-28 DIAGNOSIS — K625 Hemorrhage of anus and rectum: Secondary | ICD-10-CM | POA: Diagnosis not present

## 2021-12-29 DIAGNOSIS — K625 Hemorrhage of anus and rectum: Secondary | ICD-10-CM | POA: Diagnosis not present

## 2022-01-03 DIAGNOSIS — D122 Benign neoplasm of ascending colon: Secondary | ICD-10-CM | POA: Diagnosis not present

## 2022-01-03 DIAGNOSIS — D123 Benign neoplasm of transverse colon: Secondary | ICD-10-CM | POA: Diagnosis not present

## 2022-02-15 ENCOUNTER — Ambulatory Visit (HOSPITAL_COMMUNITY)
Admission: RE | Admit: 2022-02-15 | Discharge: 2022-02-15 | Disposition: A | Discharge status: Home / Self Care | Payer: Medicare Other | Source: Ambulatory Visit | Attending: Internal Medicine | Admitting: Internal Medicine

## 2022-02-15 LAB — POCT HEMOGLOBIN-HEMACUE: Hemoglobin: 15.9 g/dL (ref 13.0–17.0)

## 2022-02-15 MED ORDER — LIDOCAINE HCL (PF) 1 % IJ SOLN
INTRAMUSCULAR | Status: AC
  Administered 2022-02-15: 2 mL via INTRADERMAL
  Filled 2022-02-15: qty 2

## 2022-02-15 MED ORDER — LIDOCAINE HCL (PF) 1 % IJ SOLN: 2.0000 mL | Freq: Once | INTRAMUSCULAR | Status: AC

## 2022-03-15 ENCOUNTER — Ambulatory Visit
Admission: RE | Admit: 2022-03-15 | Discharge: 2022-03-15 | Disposition: A | Discharge status: Home / Self Care | Payer: Medicare Other | Source: Ambulatory Visit | Attending: Internal Medicine | Admitting: Internal Medicine

## 2022-03-15 ENCOUNTER — Other Ambulatory Visit: Payer: Self-pay | Admitting: Internal Medicine

## 2022-03-15 DIAGNOSIS — M79675 Pain in left toe(s): Secondary | ICD-10-CM

## 2022-03-15 DIAGNOSIS — M1310 Monoarthritis, not elsewhere classified, unspecified site: Secondary | ICD-10-CM | POA: Diagnosis not present

## 2022-03-20 ENCOUNTER — Encounter (HOSPITAL_COMMUNITY): Payer: Medicare Other

## 2022-03-29 DIAGNOSIS — Z1331 Encounter for screening for depression: Secondary | ICD-10-CM | POA: Diagnosis not present

## 2022-03-29 DIAGNOSIS — E7800 Pure hypercholesterolemia, unspecified: Secondary | ICD-10-CM | POA: Diagnosis not present

## 2022-03-29 DIAGNOSIS — I83893 Varicose veins of bilateral lower extremities with other complications: Secondary | ICD-10-CM | POA: Diagnosis not present

## 2022-03-29 DIAGNOSIS — Z0000 Encounter for general adult medical examination without abnormal findings: Secondary | ICD-10-CM | POA: Diagnosis not present

## 2022-03-29 DIAGNOSIS — E669 Obesity, unspecified: Secondary | ICD-10-CM | POA: Diagnosis not present

## 2022-03-29 DIAGNOSIS — I10 Essential (primary) hypertension: Secondary | ICD-10-CM | POA: Diagnosis not present

## 2022-03-29 DIAGNOSIS — N400 Enlarged prostate without lower urinary tract symptoms: Secondary | ICD-10-CM | POA: Diagnosis not present

## 2022-03-29 DIAGNOSIS — Z79899 Other long term (current) drug therapy: Secondary | ICD-10-CM | POA: Diagnosis not present

## 2022-03-29 DIAGNOSIS — M1310 Monoarthritis, not elsewhere classified, unspecified site: Secondary | ICD-10-CM | POA: Diagnosis not present

## 2022-05-16 ENCOUNTER — Other Ambulatory Visit (HOSPITAL_COMMUNITY): Payer: Self-pay

## 2022-05-17 ENCOUNTER — Encounter (HOSPITAL_COMMUNITY): Payer: 59

## 2022-05-18 DIAGNOSIS — Z23 Encounter for immunization: Secondary | ICD-10-CM | POA: Diagnosis not present

## 2022-05-31 ENCOUNTER — Ambulatory Visit (HOSPITAL_COMMUNITY)
Admission: RE | Admit: 2022-05-31 | Discharge: 2022-05-31 | Disposition: A | Discharge status: Home / Self Care | Payer: Medicare Other | Source: Ambulatory Visit | Attending: Internal Medicine | Admitting: Internal Medicine

## 2022-05-31 LAB — POCT HEMOGLOBIN-HEMACUE: Hemoglobin: 16 g/dL (ref 13.0–17.0)

## 2022-05-31 MED ORDER — LIDOCAINE HCL (PF) 1 % IJ SOLN
INTRAMUSCULAR | Status: AC
  Administered 2022-05-31: 2 mL
  Filled 2022-05-31: qty 2

## 2022-05-31 NOTE — Progress Notes (Signed)
Pt here for therapeutic phlebotomy.  HGB 16 via hemocue.  500cc blood removed via right a/c without any difficulty.  Pt tolerated without any complaints

## 2022-06-12 DIAGNOSIS — Z23 Encounter for immunization: Secondary | ICD-10-CM | POA: Diagnosis not present

## 2022-08-30 ENCOUNTER — Encounter (HOSPITAL_COMMUNITY): Payer: 59

## 2022-08-31 ENCOUNTER — Encounter (HOSPITAL_COMMUNITY): Payer: 59

## 2022-09-06 ENCOUNTER — Ambulatory Visit (HOSPITAL_COMMUNITY)
Admission: RE | Admit: 2022-09-06 | Discharge: 2022-09-06 | Disposition: A | Discharge status: Home / Self Care | Payer: Medicare Other | Source: Ambulatory Visit | Attending: Internal Medicine | Admitting: Internal Medicine

## 2022-09-06 LAB — POCT HEMOGLOBIN-HEMACUE: Hemoglobin: 15 g/dL (ref 13.0–17.0)

## 2022-09-06 MED ORDER — LIDOCAINE HCL (PF) 1 % IJ SOLN
INTRAMUSCULAR | Status: AC
  Administered 2022-09-06: 2 mL
  Filled 2022-09-06: qty 2

## 2022-09-06 MED ORDER — LIDOCAINE HCL 2 % IJ SOLN: 0.1000 mL | Freq: Once | INTRAMUSCULAR | Status: DC

## 2022-09-25 DIAGNOSIS — M109 Gout, unspecified: Secondary | ICD-10-CM | POA: Diagnosis not present

## 2022-09-25 DIAGNOSIS — Z8739 Personal history of other diseases of the musculoskeletal system and connective tissue: Secondary | ICD-10-CM | POA: Diagnosis not present

## 2022-09-25 DIAGNOSIS — Z6831 Body mass index (BMI) 31.0-31.9, adult: Secondary | ICD-10-CM | POA: Diagnosis not present

## 2022-09-25 DIAGNOSIS — I83893 Varicose veins of bilateral lower extremities with other complications: Secondary | ICD-10-CM | POA: Diagnosis not present

## 2022-09-25 DIAGNOSIS — I10 Essential (primary) hypertension: Secondary | ICD-10-CM | POA: Diagnosis not present

## 2022-12-05 ENCOUNTER — Encounter (HOSPITAL_COMMUNITY): Payer: 59

## 2022-12-06 ENCOUNTER — Encounter (HOSPITAL_COMMUNITY): Payer: 59

## 2022-12-13 DIAGNOSIS — Z8739 Personal history of other diseases of the musculoskeletal system and connective tissue: Secondary | ICD-10-CM | POA: Diagnosis not present

## 2022-12-18 DIAGNOSIS — Z23 Encounter for immunization: Secondary | ICD-10-CM | POA: Diagnosis not present

## 2022-12-20 DIAGNOSIS — L602 Onychogryphosis: Secondary | ICD-10-CM | POA: Diagnosis not present

## 2022-12-20 DIAGNOSIS — R141 Gas pain: Secondary | ICD-10-CM | POA: Diagnosis not present

## 2022-12-20 DIAGNOSIS — I83899 Varicose veins of unspecified lower extremities with other complications: Secondary | ICD-10-CM | POA: Diagnosis not present

## 2023-02-11 DIAGNOSIS — U071 COVID-19: Secondary | ICD-10-CM | POA: Diagnosis not present

## 2023-02-11 DIAGNOSIS — R0989 Other specified symptoms and signs involving the circulatory and respiratory systems: Secondary | ICD-10-CM | POA: Diagnosis not present

## 2023-04-11 DIAGNOSIS — Z8739 Personal history of other diseases of the musculoskeletal system and connective tissue: Secondary | ICD-10-CM | POA: Diagnosis not present

## 2023-04-11 DIAGNOSIS — Z1331 Encounter for screening for depression: Secondary | ICD-10-CM | POA: Diagnosis not present

## 2023-04-11 DIAGNOSIS — E7800 Pure hypercholesterolemia, unspecified: Secondary | ICD-10-CM | POA: Diagnosis not present

## 2023-04-11 DIAGNOSIS — Z23 Encounter for immunization: Secondary | ICD-10-CM | POA: Diagnosis not present

## 2023-04-11 DIAGNOSIS — Z86010 Personal history of colonic polyps: Secondary | ICD-10-CM | POA: Diagnosis not present

## 2023-04-11 DIAGNOSIS — Z8616 Personal history of COVID-19: Secondary | ICD-10-CM | POA: Diagnosis not present

## 2023-04-11 DIAGNOSIS — I83893 Varicose veins of bilateral lower extremities with other complications: Secondary | ICD-10-CM | POA: Diagnosis not present

## 2023-04-11 DIAGNOSIS — N400 Enlarged prostate without lower urinary tract symptoms: Secondary | ICD-10-CM | POA: Diagnosis not present

## 2023-04-11 DIAGNOSIS — E669 Obesity, unspecified: Secondary | ICD-10-CM | POA: Diagnosis not present

## 2023-04-11 DIAGNOSIS — I10 Essential (primary) hypertension: Secondary | ICD-10-CM | POA: Diagnosis not present

## 2023-04-11 DIAGNOSIS — E790 Hyperuricemia without signs of inflammatory arthritis and tophaceous disease: Secondary | ICD-10-CM | POA: Diagnosis not present

## 2023-04-11 DIAGNOSIS — Z0000 Encounter for general adult medical examination without abnormal findings: Secondary | ICD-10-CM | POA: Diagnosis not present

## 2023-04-22 ENCOUNTER — Other Ambulatory Visit (HOSPITAL_COMMUNITY): Payer: Self-pay | Admitting: *Deleted

## 2023-04-23 ENCOUNTER — Ambulatory Visit (HOSPITAL_COMMUNITY)
Admission: RE | Admit: 2023-04-23 | Discharge: 2023-04-23 | Disposition: A | Discharge status: Home / Self Care | Payer: Medicare Other | Source: Ambulatory Visit | Attending: Internal Medicine | Admitting: Internal Medicine

## 2023-04-23 LAB — POCT HEMOGLOBIN-HEMACUE: Hemoglobin: 13.2 g/dL (ref 13.0–17.0)

## 2023-04-23 MED ORDER — LIDOCAINE HCL (PF) 1 % IJ SOLN: 2.0000 mL | Freq: Once | INTRAMUSCULAR | Status: AC

## 2023-04-23 MED ORDER — LIDOCAINE HCL (PF) 1 % IJ SOLN
INTRAMUSCULAR | Status: AC
  Administered 2023-04-23: 2 mL via INTRADERMAL
  Filled 2023-04-23: qty 2

## 2023-05-28 ENCOUNTER — Encounter (HOSPITAL_BASED_OUTPATIENT_CLINIC_OR_DEPARTMENT_OTHER): Payer: Self-pay | Admitting: Emergency Medicine

## 2023-05-28 ENCOUNTER — Emergency Department (HOSPITAL_BASED_OUTPATIENT_CLINIC_OR_DEPARTMENT_OTHER): Payer: Medicare Other

## 2023-05-28 ENCOUNTER — Inpatient Hospital Stay (HOSPITAL_BASED_OUTPATIENT_CLINIC_OR_DEPARTMENT_OTHER)
Admission: EM | Admit: 2023-05-28 | Discharge: 2023-05-31 | DRG: 308 | Disposition: A | Discharge status: Home / Self Care | Payer: Medicare Other | Attending: Internal Medicine | Admitting: Internal Medicine

## 2023-05-28 ENCOUNTER — Other Ambulatory Visit: Payer: Self-pay

## 2023-05-28 DIAGNOSIS — J189 Pneumonia, unspecified organism: Secondary | ICD-10-CM | POA: Diagnosis present

## 2023-05-28 DIAGNOSIS — I4891 Unspecified atrial fibrillation: Secondary | ICD-10-CM | POA: Diagnosis not present

## 2023-05-28 DIAGNOSIS — I2489 Other forms of acute ischemic heart disease: Secondary | ICD-10-CM | POA: Diagnosis not present

## 2023-05-28 DIAGNOSIS — I2510 Atherosclerotic heart disease of native coronary artery without angina pectoris: Secondary | ICD-10-CM | POA: Diagnosis not present

## 2023-05-28 DIAGNOSIS — E785 Hyperlipidemia, unspecified: Secondary | ICD-10-CM | POA: Diagnosis present

## 2023-05-28 DIAGNOSIS — Z8616 Personal history of COVID-19: Secondary | ICD-10-CM

## 2023-05-28 DIAGNOSIS — Z7982 Long term (current) use of aspirin: Secondary | ICD-10-CM | POA: Diagnosis not present

## 2023-05-28 DIAGNOSIS — R509 Fever, unspecified: Secondary | ICD-10-CM

## 2023-05-28 DIAGNOSIS — Z7901 Long term (current) use of anticoagulants: Secondary | ICD-10-CM

## 2023-05-28 DIAGNOSIS — D696 Thrombocytopenia, unspecified: Secondary | ICD-10-CM | POA: Diagnosis present

## 2023-05-28 DIAGNOSIS — R0902 Hypoxemia: Secondary | ICD-10-CM | POA: Diagnosis present

## 2023-05-28 DIAGNOSIS — J90 Pleural effusion, not elsewhere classified: Secondary | ICD-10-CM | POA: Diagnosis not present

## 2023-05-28 DIAGNOSIS — I10 Essential (primary) hypertension: Secondary | ICD-10-CM | POA: Diagnosis present

## 2023-05-28 DIAGNOSIS — Z87891 Personal history of nicotine dependence: Secondary | ICD-10-CM | POA: Diagnosis not present

## 2023-05-28 DIAGNOSIS — I517 Cardiomegaly: Secondary | ICD-10-CM | POA: Diagnosis not present

## 2023-05-28 DIAGNOSIS — R7989 Other specified abnormal findings of blood chemistry: Secondary | ICD-10-CM | POA: Diagnosis not present

## 2023-05-28 DIAGNOSIS — I700 Atherosclerosis of aorta: Secondary | ICD-10-CM | POA: Diagnosis not present

## 2023-05-28 DIAGNOSIS — Z1152 Encounter for screening for COVID-19: Secondary | ICD-10-CM | POA: Diagnosis not present

## 2023-05-28 DIAGNOSIS — R0989 Other specified symptoms and signs involving the circulatory and respiratory systems: Secondary | ICD-10-CM | POA: Diagnosis not present

## 2023-05-28 DIAGNOSIS — R918 Other nonspecific abnormal finding of lung field: Secondary | ICD-10-CM | POA: Diagnosis not present

## 2023-05-28 DIAGNOSIS — Z79899 Other long term (current) drug therapy: Secondary | ICD-10-CM

## 2023-05-28 HISTORY — DX: Hyperlipidemia, unspecified: E78.5

## 2023-05-28 HISTORY — DX: Hemochromatosis, unspecified: E83.119

## 2023-05-28 LAB — COMPREHENSIVE METABOLIC PANEL
ALT: 51 U/L — ABNORMAL HIGH (ref 0–44)
AST: 46 U/L — ABNORMAL HIGH (ref 15–41)
Albumin: 4.1 g/dL (ref 3.5–5.0)
Alkaline Phosphatase: 107 U/L (ref 38–126)
Anion gap: 12 (ref 5–15)
BUN: 14 mg/dL (ref 8–23)
CO2: 21 mmol/L — ABNORMAL LOW (ref 22–32)
Calcium: 9 mg/dL (ref 8.9–10.3)
Chloride: 103 mmol/L (ref 98–111)
Creatinine, Ser: 0.88 mg/dL (ref 0.61–1.24)
GFR, Estimated: >60 mL/min (ref >60)
Glucose, Bld: 116 mg/dL — ABNORMAL HIGH (ref 70–99)
Potassium: 3.6 mmol/L (ref 3.5–5.1)
Sodium: 136 mmol/L (ref 135–145)
Total Bilirubin: 1.4 mg/dL — ABNORMAL HIGH (ref 0.3–1.2)
Total Protein: 7.7 g/dL (ref 6.5–8.1)

## 2023-05-28 LAB — PROTIME-INR
INR: 1.1 (ref 0.8–1.2)
Prothrombin Time: 13.9 s (ref 11.4–15.2)

## 2023-05-28 LAB — CBC WITH DIFFERENTIAL/PLATELET
Abs Immature Granulocytes: 0.02 10*3/uL (ref 0.00–0.07)
Basophils Absolute: 0.1 10*3/uL (ref 0.0–0.1)
Basophils Relative: 1 %
Eosinophils Absolute: 0 10*3/uL (ref 0.0–0.5)
Eosinophils Relative: 0 %
HCT: 41.3 % (ref 39.0–52.0)
Hemoglobin: 14.7 g/dL (ref 13.0–17.0)
Immature Granulocytes: 0 %
Lymphocytes Relative: 6 %
Lymphs Abs: 0.6 10*3/uL — ABNORMAL LOW (ref 0.7–4.0)
MCH: 35.1 pg — ABNORMAL HIGH (ref 26.0–34.0)
MCHC: 35.6 g/dL (ref 30.0–36.0)
MCV: 98.6 fL (ref 80.0–100.0)
Monocytes Absolute: 0.6 10*3/uL (ref 0.1–1.0)
Monocytes Relative: 7 %
Neutro Abs: 8.5 10*3/uL — ABNORMAL HIGH (ref 1.7–7.7)
Neutrophils Relative %: 86 %
Platelets: 123 10*3/uL — ABNORMAL LOW (ref 150–400)
RBC: 4.19 MIL/uL — ABNORMAL LOW (ref 4.22–5.81)
RDW: 13.2 % (ref 11.5–15.5)
WBC: 9.8 10*3/uL (ref 4.0–10.5)
nRBC: 0 % (ref 0.0–0.2)

## 2023-05-28 LAB — LACTIC ACID, PLASMA: Lactic Acid, Venous: 1.7 mmol/L (ref 0.5–1.9)

## 2023-05-28 NOTE — ED Triage Notes (Signed)
Pt c/o cough x 1 week and fever/chills that started yesterday. Last temp was 100.3 at home, no tylenol given.

## 2023-05-28 NOTE — ED Provider Notes (Signed)
Drew Calderon Provider Note   CSN: [id number] Arrival date & time: 05/28/23  2243     History  Chief Complaint  Patient presents with   Fever    Drew Calderon is a 82 y.o. male.  Patient is an 82 year old male with past medical history of hyperlipidemia, hypertension.  Patient presenting today for evaluation of fever.  He returned home from a trip to [place] 8 days ago.  Shortly after returning home, he began to develop a cough.  This evening, he developed fever to 100.3 at home and presents for evaluation of this.  Patient denies any chest pain or difficulty breathing.  No abdominal pain, but does describe some loose stool.  He denies any ill contacts.  He traveled with his wife, but she is not experiencing symptoms.  The history is provided by the patient.       Home Medications Prior to Admission medications   Medication Sig Start Date End Date Taking? Authorizing Provider  amLODipine (NORVASC) 5 MG tablet Take 5 mg by mouth daily. 05/20/15   [provider]  aspirin EC 81 MG tablet Take 81 mg by mouth daily.    [provider]  atorvastatin (LIPITOR) 10 MG tablet Take 10 mg by mouth daily. 05/20/15   [provider]  cephALEXin (KEFLEX) 500 MG capsule Take 1 capsule (500 mg total) by mouth 4 (four) times daily. Patient not taking: Reported on 04/12/2017 06/26/15   Lorre Nick, MD  doxycycline (VIBRAMYCIN) 100 MG capsule Take 1 capsule (100 mg total) by mouth 2 (two) times daily. 04/12/17   Rise Mu, PA-C  ECHINACEA PO Take 1 tablet by mouth daily.    [provider]  HYDROcodone-acetaminophen (NORCO/VICODIN) 5-325 MG tablet Take 1-2 tablets by mouth every 4 (four) hours as needed. Patient not taking: Reported on 04/12/2017 06/26/15   Lorre Nick, MD  OVER THE COUNTER MEDICATION Take 1 tablet by mouth 2 (two) times daily. Antioxidant multi vitamin    [provider]       Allergies    Patient has no known allergies.    Review of Systems   Review of Systems  All other systems reviewed and are negative.   Physical Exam Updated Vital Signs BP (!) 133/98   Pulse (!) 140   Temp 99 F (37.2 C)   Resp 18   Ht 6' (1.829 m)   Wt 99.8 kg   SpO2 90%   BMI 29.84 kg/m  Physical Exam Vitals and nursing note reviewed.  Constitutional:      General: He is not in acute distress.    Appearance: He is well-developed. He is not diaphoretic.  HENT:     Head: Normocephalic and atraumatic.     Mouth/Throat:     Mouth: Mucous membranes are moist.     Pharynx: No oropharyngeal exudate or posterior oropharyngeal erythema.  Cardiovascular:     Rate and Rhythm: Tachycardia present. Rhythm irregular.     Heart sounds: No murmur heard.    No friction rub.  Pulmonary:     Effort: Pulmonary effort is normal. No respiratory distress.     Breath sounds: Normal breath sounds. No wheezing or rales.  Abdominal:     General: Bowel sounds are normal. There is no distension.     Palpations: Abdomen is soft.     Tenderness: There is no abdominal tenderness.  Musculoskeletal:        General: Normal range  of motion.     Cervical back: Normal range of motion and neck supple.  Skin:    General: Skin is warm and dry.  Neurological:     Mental Status: He is alert and oriented to person, place, and time.     Coordination: Coordination normal.     ED Results / Procedures / Treatments   Labs (all labs ordered are listed, but only abnormal results are displayed) Labs Reviewed  CULTURE, BLOOD (ROUTINE X 2)  CULTURE, BLOOD (ROUTINE X 2)  RESP PANEL BY RT-PCR (RSV, FLU A&B, COVID)  RVPGX2  COMPREHENSIVE METABOLIC PANEL  LACTIC ACID, PLASMA  LACTIC ACID, PLASMA  CBC WITH DIFFERENTIAL/PLATELET  PROTIME-INR  URINALYSIS, W/ REFLEX TO CULTURE (INFECTION SUSPECTED)  BRAIN NATRIURETIC PEPTIDE  TROPONIN I (HIGH SENSITIVITY)    EKG EKG Interpretation Date/Time:  Tuesday  May 28 2023 23:00:13 EDT Ventricular Rate:  127 PR Interval:    QRS Duration:  78 QT Interval:  292 QTC Calculation: 425 R Axis:   46  Text Interpretation: Atrial fibrillation Repolarization abnormality, prob rate related When compared with ecg dated 06/26/2015, atrial fibrillation has replaced sinus rhythm Confirmed by Geoffery Lyons ([id number]) on 05/28/2023 11:04:39 PM  Radiology No results found.  Procedures Procedures  {Document cardiac monitor, telemetry assessment procedure when appropriate:1}  Medications Ordered in ED Medications - No data to display  ED Course/ Medical Decision Making/ A&P   {   Click here for ABCD2, HEART and other calculatorsREFRESH Note before signing :1}                              Medical Decision Making Amount and/or Complexity of Data Reviewed Labs: ordered. Radiology: ordered.   ***  {Document critical care time when appropriate:1} {Document review of labs and clinical decision tools ie heart score, Chads2Vasc2 etc:1}  {Document your independent review of radiology images, and any outside records:1} {Document your discussion with family members, caretakers, and with consultants:1} {Document social determinants of health affecting pt's care:1} {Document your decision making why or why not admission, treatments were needed:1} Final Clinical Impression(s) / ED Diagnoses Final diagnoses:  None    Rx / DC Orders ED Discharge Orders     None

## 2023-05-29 ENCOUNTER — Other Ambulatory Visit (HOSPITAL_COMMUNITY): Payer: Medicare Other

## 2023-05-29 ENCOUNTER — Encounter (HOSPITAL_COMMUNITY): Payer: Self-pay | Admitting: Internal Medicine

## 2023-05-29 ENCOUNTER — Inpatient Hospital Stay (HOSPITAL_COMMUNITY): Payer: Medicare Other

## 2023-05-29 DIAGNOSIS — R7989 Other specified abnormal findings of blood chemistry: Secondary | ICD-10-CM | POA: Diagnosis not present

## 2023-05-29 DIAGNOSIS — I2489 Other forms of acute ischemic heart disease: Secondary | ICD-10-CM | POA: Diagnosis present

## 2023-05-29 DIAGNOSIS — R918 Other nonspecific abnormal finding of lung field: Secondary | ICD-10-CM | POA: Diagnosis not present

## 2023-05-29 DIAGNOSIS — I700 Atherosclerosis of aorta: Secondary | ICD-10-CM | POA: Diagnosis not present

## 2023-05-29 DIAGNOSIS — I2510 Atherosclerotic heart disease of native coronary artery without angina pectoris: Secondary | ICD-10-CM | POA: Diagnosis not present

## 2023-05-29 DIAGNOSIS — Z79899 Other long term (current) drug therapy: Secondary | ICD-10-CM | POA: Diagnosis not present

## 2023-05-29 DIAGNOSIS — Z8616 Personal history of COVID-19: Secondary | ICD-10-CM | POA: Diagnosis not present

## 2023-05-29 DIAGNOSIS — I4891 Unspecified atrial fibrillation: Secondary | ICD-10-CM | POA: Diagnosis present

## 2023-05-29 DIAGNOSIS — Z7982 Long term (current) use of aspirin: Secondary | ICD-10-CM | POA: Diagnosis not present

## 2023-05-29 DIAGNOSIS — D696 Thrombocytopenia, unspecified: Secondary | ICD-10-CM | POA: Diagnosis present

## 2023-05-29 DIAGNOSIS — Z1152 Encounter for screening for COVID-19: Secondary | ICD-10-CM | POA: Diagnosis not present

## 2023-05-29 DIAGNOSIS — Z7901 Long term (current) use of anticoagulants: Secondary | ICD-10-CM | POA: Diagnosis not present

## 2023-05-29 DIAGNOSIS — J189 Pneumonia, unspecified organism: Secondary | ICD-10-CM

## 2023-05-29 DIAGNOSIS — J90 Pleural effusion, not elsewhere classified: Secondary | ICD-10-CM | POA: Diagnosis not present

## 2023-05-29 DIAGNOSIS — Z87891 Personal history of nicotine dependence: Secondary | ICD-10-CM | POA: Diagnosis not present

## 2023-05-29 DIAGNOSIS — I10 Essential (primary) hypertension: Secondary | ICD-10-CM | POA: Diagnosis present

## 2023-05-29 DIAGNOSIS — R0902 Hypoxemia: Secondary | ICD-10-CM | POA: Diagnosis present

## 2023-05-29 DIAGNOSIS — E785 Hyperlipidemia, unspecified: Secondary | ICD-10-CM | POA: Diagnosis present

## 2023-05-29 LAB — CBC
HCT: 39.1 % (ref 39.0–52.0)
Hemoglobin: 14 g/dL (ref 13.0–17.0)
MCH: 35.4 pg — ABNORMAL HIGH (ref 26.0–34.0)
MCHC: 35.8 g/dL (ref 30.0–36.0)
MCV: 98.7 fL (ref 80.0–100.0)
Platelets: 113 10*3/uL — ABNORMAL LOW (ref 150–400)
RBC: 3.96 MIL/uL — ABNORMAL LOW (ref 4.22–5.81)
RDW: 13.2 % (ref 11.5–15.5)
WBC: 10.9 10*3/uL — ABNORMAL HIGH (ref 4.0–10.5)
nRBC: 0 % (ref 0.0–0.2)

## 2023-05-29 LAB — RESPIRATORY PANEL BY PCR

## 2023-05-29 LAB — RESP PANEL BY RT-PCR (RSV, FLU A&B, COVID)  RVPGX2
Influenza A by PCR: NEGATIVE
Influenza B by PCR: NEGATIVE
Resp Syncytial Virus by PCR: NEGATIVE
SARS Coronavirus 2 by RT PCR: NEGATIVE

## 2023-05-29 LAB — STREP PNEUMONIAE URINARY ANTIGEN: Strep Pneumo Urinary Antigen: NEGATIVE

## 2023-05-29 LAB — PROCALCITONIN: Procalcitonin: 0.77 ng/mL

## 2023-05-29 LAB — BRAIN NATRIURETIC PEPTIDE: B Natriuretic Peptide: 219.3 pg/mL — ABNORMAL HIGH (ref 0.0–100.0)

## 2023-05-29 LAB — HEPARIN LEVEL (UNFRACTIONATED): Heparin Unfractionated: <0.1 [IU]/mL — ABNORMAL LOW (ref 0.30–0.70)

## 2023-05-29 LAB — TSH: TSH: 2.459 u[IU]/mL (ref 0.350–4.500)

## 2023-05-29 LAB — TROPONIN I (HIGH SENSITIVITY)
Troponin I (High Sensitivity): 161 ng/L (ref <18)
Troponin I (High Sensitivity): 154 ng/L (ref <18)

## 2023-05-29 MED ORDER — METOPROLOL TARTRATE 5 MG/5ML IV SOLN
5.0000 mg | Freq: Once | INTRAVENOUS | Status: AC
  Administered 2023-05-29: 5 mg via INTRAVENOUS
  Filled 2023-05-29: qty 5

## 2023-05-29 MED ORDER — DILTIAZEM HCL-DEXTROSE 125-5 MG/125ML-% IV SOLN (PREMIX)
5.0000 mg/h | INTRAVENOUS | Status: AC
  Administered 2023-05-29 (×2): 15 mg/h via INTRAVENOUS
  Administered 2023-05-29: 5 mg/h via INTRAVENOUS
  Administered 2023-05-30 (×2): 15 mg/h via INTRAVENOUS
  Filled 2023-05-29 (×6): qty 125

## 2023-05-29 MED ORDER — HEPARIN BOLUS VIA INFUSION
4500.0000 [IU] | Freq: Once | INTRAVENOUS | Status: AC
  Administered 2023-05-29: 4500 [IU] via INTRAVENOUS

## 2023-05-29 MED ORDER — SODIUM CHLORIDE 0.9 % IV SOLN
500.0000 mg | INTRAVENOUS | Status: DC
  Administered 2023-05-29: 500 mg via INTRAVENOUS
  Filled 2023-05-29 (×2): qty 5

## 2023-05-29 MED ORDER — APIXABAN 5 MG PO TABS
5.0000 mg | ORAL_TABLET | Freq: Two times a day (BID) | ORAL | Status: DC
  Administered 2023-05-29 – 2023-05-31 (×4): 5 mg via ORAL
  Filled 2023-05-29 (×4): qty 1

## 2023-05-29 MED ORDER — HEPARIN BOLUS VIA INFUSION
2900.0000 [IU] | Freq: Once | INTRAVENOUS | Status: AC
  Administered 2023-05-29: 2900 [IU] via INTRAVENOUS
  Filled 2023-05-29: qty 2900

## 2023-05-29 MED ORDER — HEPARIN (PORCINE) 25000 UT/250ML-% IV SOLN
1500.0000 [IU]/h | INTRAVENOUS | Status: AC
  Administered 2023-05-29: 1500 [IU]/h via INTRAVENOUS
  Administered 2023-05-29: 1250 [IU]/h via INTRAVENOUS
  Filled 2023-05-29 (×2): qty 250

## 2023-05-29 MED ORDER — TRAZODONE HCL 50 MG PO TABS
50.0000 mg | ORAL_TABLET | Freq: Every day | ORAL | Status: DC
  Administered 2023-05-29 – 2023-05-30 (×2): 50 mg via ORAL
  Filled 2023-05-29 (×2): qty 1

## 2023-05-29 MED ORDER — ATORVASTATIN CALCIUM 10 MG PO TABS
10.0000 mg | ORAL_TABLET | Freq: Every day | ORAL | Status: DC
  Administered 2023-05-29 – 2023-05-31 (×3): 10 mg via ORAL
  Filled 2023-05-29 (×3): qty 1

## 2023-05-29 MED ORDER — SODIUM CHLORIDE 0.9 % IV SOLN
1.0000 g | INTRAVENOUS | Status: DC
  Administered 2023-05-29 – 2023-05-31 (×3): 1 g via INTRAVENOUS
  Filled 2023-05-29 (×3): qty 10

## 2023-05-29 MED ORDER — DILTIAZEM LOAD VIA INFUSION
10.0000 mg | Freq: Once | INTRAVENOUS | Status: AC
  Administered 2023-05-29: 10 mg via INTRAVENOUS
  Filled 2023-05-29: qty 10

## 2023-05-29 MED ORDER — IOHEXOL 350 MG/ML SOLN
75.0000 mL | Freq: Once | INTRAVENOUS | Status: AC | PRN
  Administered 2023-05-29: 75 mL via INTRAVENOUS

## 2023-05-29 NOTE — TOC Initial Note (Signed)
Transition of Care [facility]) - Initial/Assessment Note    Patient Details  Name: Drew Calderon MRN: [id number] Date of Birth: 10/29/1940  [facility] CM/SW Contact:    Lanier Clam, RN Phone Number: 05/29/2023, 10:46 AM  Clinical Narrative:  d/c plan home. Monitor for d/c needs. Currently on 02.                 Expected Discharge Plan: Home/Self Care Barriers to Discharge: Continued Medical Work up   Patient Goals and CMS Choice Patient states their goals for this hospitalization and ongoing recovery are:: Home CMS Medicare.gov Compare Post Acute Care list provided to:: Patient Choice offered to / list presented to : Patient [place] ownership interest in [facility]provided to:: Patient    Expected Discharge Plan and Services   Discharge Planning Services: CM Consult   Living arrangements for the past 2 months: Single Family Home                                      Prior Living Arrangements/Services Living arrangements for the past 2 months: Single Family Home Lives with:: Spouse Patient language and need for interpreter reviewed:: Yes Do you feel safe going back to the place where you live?: Yes      Need for Family Participation in Patient Care: Yes (Comment) Care giver support system in place?: Yes (comment)   Criminal Activity/Legal Involvement Pertinent to Current Situation/Hospitalization: No - Comment as needed  Activities of Daily Living   ADL Screening (condition at time of admission) Independently performs ADLs?: Yes (appropriate for developmental age) Is the patient deaf or have difficulty hearing?: Yes Does the patient have difficulty seeing, even when wearing glasses/contacts?: No Does the patient have difficulty concentrating, remembering, or making decisions?: No  Permission Sought/Granted Permission sought to share information with : Case Manager Permission granted to share information with : Yes, Verbal  Permission Granted  Share Information with NAME: Case manager           Emotional Assessment Appearance:: Appears stated age Attitude/Demeanor/Rapport: Gracious Affect (typically observed): Accepting Orientation: : Oriented to Self, Oriented to Place, Oriented to  Time, Oriented to Situation Alcohol / Substance Use: Not Applicable Psych Involvement: No (comment)  Admission diagnosis:  Atrial fibrillation with rapid ventricular response (HCC) [I48.91] Febrile illness [R50.9] Atrial fibrillation with RVR (HCC) [I48.91] A-fib (HCC) [I48.91] Patient Active Problem List   Diagnosis Date Noted   Atrial fibrillation with RVR (HCC) 05/29/2023   A-fib (HCC) 05/29/2023   PCP:  Lorenda Ishihara, MD Pharmacy:   [id number] [place], [place] - [address] AT [facility] OF [facility] & [address] [place] [address] Phone: [phone] Fax: [phone]  [facility] [address] [place], [place] - [address] [facility] [address] [place] [address] Phone: [phone] Fax: [phone]  [place] - [facility] [address] [place] [address] Phone: [phone] Fax: [phone]  [id number] [place], [place] - [address] [place] [address] Phone: [phone] Fax: [phone]     Social Determinants of Health (SDOH) Social History: SDOH Screenings   Food Insecurity: No Food Insecurity (05/29/2023)  Housing: Low Risk  (05/29/2023)  Transportation Needs: No Transportation Needs (05/29/2023)  Utilities: Not At Risk (05/29/2023)  Social Connections: Unknown (12/12/2021)   Received from [facility],  [facility]  Tobacco Use: Medium Risk (05/28/2023)   SDOH Interventions:     Readmission Risk Interventions     No data to display

## 2023-05-29 NOTE — Discharge Instructions (Signed)

## 2023-05-29 NOTE — Plan of Care (Signed)
  Problem: Clinical Measurements: Goal: Ability to maintain clinical measurements within normal limits will improve Outcome: Progressing   Problem: Pain Management: Goal: General experience of comfort will improve Outcome: Progressing   Problem: Education: Goal: Knowledge of disease or condition will improve Outcome: Progressing

## 2023-05-29 NOTE — Progress Notes (Signed)
Pharmacy: heparin --> Eliquis  Patient is an 82 y.o M who is currently on heparin drip for afib with RVR. Pharmacy has been consulted on 05/29/23 to transition to Eliquis.   Plan: - d/c heparin drip - start Eliquis 5 mg bid (for weight >60 kg and scr < 1.5)  - pharmacy will sign off for Eliquis, but will follow patient peripherally along with you. Re-consult [place] if further assistance is needed.   Dorna Leitz, PharmD, BCPS 05/29/2023 6:17 PM

## 2023-05-29 NOTE — ED Notes (Signed)
Walked pt to bathroom with portable O2 tank and IV pump. Pt felt more comfortable relieving himself in private instead of using bedside commode. Pt is aware of the high risk IVF he is receiving. Pt O2 set to 5L for extra oxygenation support. Pt tolerated well and feels much better.

## 2023-05-29 NOTE — Care Management Obs Status (Signed)
MEDICARE OBSERVATION STATUS NOTIFICATION   Patient Details  Name: Drew Calderon MRN: [id number] Date of Birth: Jan 13, 1941   Medicare Observation Status Notification Given:  Yes    MahabirOlegario Messier, RN 05/29/2023, 10:45 AM

## 2023-05-29 NOTE — Progress Notes (Signed)
PHARMACY - ANTICOAGULATION CONSULT NOTE  Pharmacy Consult for heparin Indication: atrial fibrillation  No Known Allergies  Patient Measurements: Height: 6' (182.9 cm) Weight: 99.8 kg (220 lb 0.3 oz) IBW/kg (Calculated) : 77.6 Heparin Dosing Weight: 97.8 kg  Vital Signs: Temp: 99 F (37.2 C) (10/29 2254) BP: 133/92 (10/30 0105) Pulse Rate: 115 (10/30 0105)  Labs: Recent Labs    05/28/23 2317  HGB 14.7  HCT 41.3  PLT 123*  LABPROT 13.9  INR 1.1  CREATININE 0.88  TROPONINIHS 161*    Estimated Creatinine Clearance: 77.8 mL/min (by C-G formula based on SCr of 0.88 mg/dL).  Assessment: 73 yoM presented to the ED with cough/fever/chills for 1 week. Recent travel to [place] ~8 days ago. Pharmacy consulted to dose heparin for atrial fibrillation.  Hgb 14.7, plts 123, sCr 0.88, no PTA anticoagulation reported  Goal of Therapy:  Heparin level 0.3-0.7 units/ml Monitor platelets by anticoagulation protocol: Yes   Plan:  Heparin bolus 4500 units x1 Start IV heparin gtt at 1250 units/hr 8h heparin level CBC and heparin level daily   Arabella Merles, PharmD. Clinical Pharmacist 05/29/2023 1:19 AM

## 2023-05-29 NOTE — Progress Notes (Signed)
PHARMACY - ANTICOAGULATION CONSULT NOTE  Pharmacy Consult for heparin Indication: atrial fibrillation  No Known Allergies  Patient Measurements: Height: 6' (182.9 cm) Weight: 99.8 kg (220 lb 0.3 oz) IBW/kg (Calculated) : 77.6 Heparin Dosing Weight: 97.8 kg  Vital Signs: Temp: 98.1 F (36.7 C) (10/30 1207) Temp Source: Oral (10/30 1207) BP: 101/65 (10/30 1207) Pulse Rate: 75 (10/30 1207)  Labs: Recent Labs    05/28/23 2317 05/29/23 0100 05/29/23 0449 05/29/23 1229  HGB 14.7  --  14.0  --   HCT 41.3  --  39.1  --   PLT 123*  --  113*  --   LABPROT 13.9  --   --   --   INR 1.1  --   --   --   HEPARINUNFRC  --   --   --  <0.10*  CREATININE 0.88  --   --   --   TROPONINIHS 161* 154*  --   --     Estimated Creatinine Clearance: 77.8 mL/min (by C-G formula based on SCr of 0.88 mg/dL).  Assessment: 8 yoM presented to the ED with cough/fever/chills for 1 week. Recent travel to [place] ~8 days PTA. Pharmacy consulted to dose heparin for newly diagnosed atrial fibrillation on undetermined duration.  Today: -Heparin level < 0.10, subtherapeutic on heparin infusion at 1250 units/hr -CBC stable -No complications of therapy noted  Goal of Therapy:  Heparin level 0.3-0.7 units/ml Monitor platelets by anticoagulation protocol: Yes   Plan:  -Heparin bolus 2900 units  -Increase heparin infusion to 1500 units/hr -Recheck 8h heparin level -CBC and heparin level daily    Pricilla Riffle, PharmD, BCPS Clinical Pharmacist 05/29/2023 1:35 PM

## 2023-05-29 NOTE — Consult Note (Addendum)
Cardiology Consultation   Patient ID: Drew Calderon MRN: [id number]; DOB: 07/22/1941  Admit date: 05/28/2023 Date of Consult: 05/29/2023  PCP:  Lorenda Ishihara, MD   [facility] Providers Cardiologist:  Shirl Harris here to update MD or APP on Care Team, Refresh:1}     Patient Profile:   Drew Calderon is a 82 y.o. male with a hx of HTN, HLD, habitual ETOH use (1-2 glasses of wine nightly), hemochromatosis who is being seen 05/29/2023 for the evaluation of atrial fibrillation and elevated troponin at the request of Dr. Jerolyn Center.  History of Present Illness:   Mr. Targett has no prior cardiac history. He had Covid in July and reports it really set him back, required 8-9 days before he felt recovered. He noticed some mild LE edema starting about 3 months back as well. He went to [place] earlier this month for 2 weeks. He noticed that when he would walk up hills he felt more fatigued than usual, as if his endurance was down. He denies any overt shortness of breath, chest pain, dizziness, or palpitations. Last Wednesday 10/23 he began feeling ill with intermittent chills, clear sputum cough, fatigue/sleeping more, and had a low grade Tmax of 100.3. There was also some loose stool. He states his wife made him go get checked out so presented to [facility] for eval. He was noted to be in rapid afib on arrival with new hypoxia requiring up to 4L  O2. Presenting temp 99. He was started on IV heparin and IV diltiazem with improvement in HR. Workup showed negative Covid/flu/RSV, borderline leukocytosis, normal lactate, mild transaminitis, hsTroponin low/flat at 161->154, BNP 219, blood cx pending. CXR shows mild cardiomegaly with central pulmonary vascular congestion with minimal patchy retrocardiac opacities. He remains in Afib, HR around 105bpm, unable to feel this. He denies any orthopnea.    Past Medical History:  Diagnosis Date   Hemochromatosis    Hyperlipidemia     Hypertension     History reviewed. No pertinent surgical history.   Home Medications:  Prior to Admission medications   Medication Sig Start Date End Date Taking? Authorizing Provider  amLODipine (NORVASC) 5 MG tablet Take 5 mg by mouth daily. 05/20/15   [provider]  aspirin EC 81 MG tablet Take 81 mg by mouth daily.    [provider]  atorvastatin (LIPITOR) 10 MG tablet Take 10 mg by mouth daily. 05/20/15   [provider]  cephALEXin (KEFLEX) 500 MG capsule Take 1 capsule (500 mg total) by mouth 4 (four) times daily. Patient not taking: Reported on 04/12/2017 06/26/15   Lorre Nick, MD  doxycycline (VIBRAMYCIN) 100 MG capsule Take 1 capsule (100 mg total) by mouth 2 (two) times daily. 04/12/17   Rise Mu, PA-C  ECHINACEA PO Take 1 tablet by mouth daily.    [provider]  HYDROcodone-acetaminophen (NORCO/VICODIN) 5-325 MG tablet Take 1-2 tablets by mouth every 4 (four) hours as needed. Patient not taking: Reported on 04/12/2017 06/26/15   Lorre Nick, MD  OVER THE COUNTER MEDICATION Take 1 tablet by mouth 2 (two) times daily. Antioxidant multi vitamin    [provider]    Inpatient Medications: Scheduled Meds:  metoprolol tartrate  5 mg Intravenous Once   Continuous Infusions:  diltiazem (CARDIZEM) infusion 15 mg/hr (05/29/23 0824)   heparin 1,250 Units/hr (05/29/23 0129)   PRN Meds:   Allergies:   No Known Allergies  Social History:   Social History  Socioeconomic History   Marital status: Married    Spouse name: Not on file   Number of children: Not on file   Years of education: Not on file   Highest education level: Not on file  Occupational History   Not on file  Tobacco Use   Smoking status: Former   Smokeless tobacco: Never  Substance and Sexual Activity   Alcohol use: Yes    Alcohol/week: 2.0 standard drinks of alcohol    Types: 2 Glasses of wine per week    Comment: 2 glasses wine daily     Drug use: No   Sexual activity: Yes  Other Topics Concern   Not on file  Social History Narrative   Not on file   Social Determinants of Health   Financial Resource Strain: Not on file  Food Insecurity: No Food Insecurity (05/29/2023)   Hunger Vital Sign    Worried About Running Out of Food in the Last Year: Never true    Ran Out of Food in the Last Year: Never true  Transportation Needs: No Transportation Needs (05/29/2023)   PRAPARE - Administrator, Civil Service (Medical): No    Lack of Transportation (Non-Medical): No  Physical Activity: Not on file  Stress: Not on file  Social Connections: Unknown (12/12/2021)   Received from [facility]   Social Network    Social Network: Not on file  Intimate Partner Violence: Not At Risk (05/29/2023)   Humiliation, Afraid, Rape, and Kick questionnaire    Fear of Current or Ex-Partner: No    Emotionally Abused: No    Physically Abused: No    Sexually Abused: No    Family History:    Family History  Problem Relation Age of Onset   Heart disease Neg Hx      ROS:  Please see the history of present illness.   All other ROS reviewed and negative.     Physical Exam/Data:   Vitals:   05/29/23 0500 05/29/23 0549 05/29/23 0604 05/29/23 0632  BP: 115/77   (!) 125/107  Pulse: (!) 39 88  81  Resp: 15 16  15   Temp:   97.9 F (36.6 C) 98.6 F (37 C)  TempSrc:   Oral Oral  SpO2: 92% 92%  91%  Weight:      Height:       No intake or output data in the 24 hours ending 05/29/23 0948    05/28/2023   10:54 PM 04/23/2023   11:15 AM 09/06/2022    9:53 AM  Last 3 Weights  Weight (lbs) 220 lb 0.3 oz 220 lb 213 lb  Weight (kg) 99.8 kg 99.791 kg 96.616 kg     Body mass index is 29.84 kg/m.  General: Well developed, well nourished, in no acute distress. Head: Normocephalic, atraumatic, sclera non-icteric, no xanthomas, nares are without discharge. Neck: Negative for carotid bruits. JVP not elevated. Lungs:  Fibrotic sounding at bases, otherwise coarse BS with decreased air movement throughout, no wheezing, no rhonchi  Breathing is unlabored on O2 Heart: Irregularly irregular rhythm borderline tachycardic S1 S2 without murmurs, rubs, or gallops.  Abdomen: Soft, non-tender, non-distended with normoactive bowel sounds. No rebound/guarding. Extremities: No clubbing or cyanosis. Trace BLE edema with marked varicose veins. Distal pedal pulses are 2+ and equal bilaterally. Neuro: Alert and oriented X 3. Moves all extremities spontaneously. Psych:  Responds to questions appropriately with a normal affect.   EKG:  The EKG was personally reviewed  and demonstrates:  atrial fibrillation 127bpm with nonspecific STTW changes Telemetry:  Telemetry was personally reviewed and demonstrates:  atrial fib HR 100s-110s presently  Relevant CV Studies: None  Laboratory Data:  High Sensitivity Troponin:   Recent Labs  Lab 05/28/23 2317 05/29/23 0100  TROPONINIHS 161* 154*     Chemistry Recent Labs  Lab 05/28/23 2317  NA 136  K 3.6  CL 103  CO2 21*  GLUCOSE 116*  BUN 14  CREATININE 0.88  CALCIUM 9.0  GFRNONAA >60  ANIONGAP 12    Recent Labs  Lab 05/28/23 2317  PROT 7.7  ALBUMIN 4.1  AST 46*  ALT 51*  ALKPHOS 107  BILITOT 1.4*   Lipids No results for input(s): "CHOL", "TRIG", "HDL", "LABVLDL", "LDLCALC", "CHOLHDL" in the last 168 hours.  Hematology Recent Labs  Lab 05/28/23 2317 05/29/23 0449  WBC 9.8 10.9*  RBC 4.19* 3.96*  HGB 14.7 14.0  HCT 41.3 39.1  MCV 98.6 98.7  MCH 35.1* 35.4*  MCHC 35.6 35.8  RDW 13.2 13.2  PLT 123* 113*   Thyroid No results for input(s): "TSH", "FREET4" in the last 168 hours.  BNP Recent Labs  Lab 05/28/23 2317  BNP 219.3*    DDimer No results for input(s): "DDIMER" in the last 168 hours.   Radiology/Studies:  DG Chest Portable 1 View  Result Date: 05/29/2023 CLINICAL DATA:  Suspected sepsis EXAM: PORTABLE CHEST 1 VIEW COMPARISON:  Chest  x-ray 02/11/2023 FINDINGS: The heart is mildly enlarged. There central pulmonary vascular congestion. There are minimal patchy retrocardiac opacities. Costophrenic angles are clear. There is no pneumothorax. IMPRESSION: 1. Mild cardiomegaly with central pulmonary vascular congestion. 2. Minimal patchy retrocardiac opacities, atelectasis versus infiltrate. Electronically Signed   By: Darliss Cheney M.D.   On: 05/29/2023 01:44     Assessment and Plan:   1. Low grade fever, cough, malaise, new hypoxia requiring O2 supplementation - will order RVP for completeness - primary team pursuing CTA which will help evaluate both possible infiltrate and PE given recent trip overseas, would also note attention to report if any concerns of ILD given prolonged Covid course this summer  2. Newly diagnosed atrial fib with RVR of undetermined duration - continue IV diltiazem drip for now, HR low 100s presently - will trial a dose of IV metoprolol 5mg  x 1 to see how he responds, reassess after - continue IV heparin pending CTA to exclude PE, echo to evaluate for any new cardiomyopathies - discussed rationale for anticoagulation with patient - check thyroid - also discussed decreasing ETOH use as it pertains to risk of Afib  3. Vascular congestion on CXR, mild BNP elevation, mild LE edema - suspect some component of mild heart failure driven by AF RVR, hold off acutely on diuresis pending CTA as above - plan echocardiogram - will defer consideration of LE duplex to IM depending on CTA results  4. Elevated troponin - relatively low/flat at 161->154, suspect secondary to the above rather than primary ACS but need to obtain echocardiogram to help guide whether ischemic testing needed - will discuss ASA therapy with MD given probable plans for DOAC in setting of atrial fib - check lipids in AM  5. Ancillary abnormal labs: - mild hyperglycemia -> get A1c in AM - elevated LFTs (Tbili, AST, ALT), thrombocytopenia -  per primary team  Risk Assessment/Risk Scores:       CHA2DS2-VASc Score = 3 tentatively but possibly 4 if including mild heart failure, pending additional workup  This  indicates a 3.2% annual risk of stroke. The patient's score is based upon: CHF History: 0 HTN History: 1 Diabetes History: 0 Stroke History: 0 Vascular Disease History: 0 Age Score: 2 Gender Score: 0      For questions or updates, please contact [facility] Please consult [email or web address] for contact info under    Signed, Laurann Montana, PA-C  05/29/2023 9:48 AM

## 2023-05-29 NOTE — ED Notes (Signed)
[facility] called for transport No Current ETA.. ED Nurse will call floor for report Called @ 03:40 am

## 2023-05-29 NOTE — H&P (Signed)
History and Physical    Drew Calderon ZOX:[id number] DOB: [birth year] DOA: 05/28/2023  PCP: Lorenda Ishihara, MD Patient coming from: Home  Chief Complaint: Fever  HPI: Drew Calderon is a 82 y.o. male with medical history significant of hyperlipidemia, hypertension, recent COVID, hemochromatosis admitted with fever and hypoxia and new onset atrial fibrillation.  Patient traveled to [place] recently for 2 weeks he noticed that he was more weak than usual .  After returning home patient started to have fever and cough.  His temp was 100.3 at home.  He complained of some diarrhea.  This has resolved.  I will with his wife who is asymptomatic and well.  He denied chest pain shortness of breath abdominal pain nausea vomiting.  He was febrile in the ER.  He reports he is active at baseline he works out he walks.  Patient was found to be hypoxic in ER and was placed on O2. Ct chest-No evidence of pulmonary embolism. Patchy consolidation in dependent portions of the lungs, greatest in the left lower lobe, suspicious for aspiration versus multifocal infection.Trace left pleural effusion.  Past Medical History:  Diagnosis Date   Hyperlipidemia    Hypertension     History reviewed. No pertinent surgical history.  Social History   Socioeconomic History   Marital status: Married    Spouse name: Not on file   Number of children: Not on file   Years of education: Not on file   Highest education level: Not on file  Occupational History   Not on file  Tobacco Use   Smoking status: Former   Smokeless tobacco: Never  Substance and Sexual Activity   Alcohol use: Yes    Alcohol/week: 2.0 standard drinks of alcohol    Types: 2 Glasses of wine per week    Comment: 2 glasses wine daily    Drug use: No   Sexual activity: Yes  Other Topics Concern   Not on file  Social History Narrative   Not on file   Social Determinants of Health   Financial Resource Strain: Not on file  Food  Insecurity: No Food Insecurity (05/29/2023)   Hunger Vital Sign    Worried About Running Out of Food in the Last Year: Never true    Ran Out of Food in the Last Year: Never true  Transportation Needs: No Transportation Needs (05/29/2023)   PRAPARE - Administrator, Civil Service (Medical): No    Lack of Transportation (Non-Medical): No  Physical Activity: Not on file  Stress: Not on file  Social Connections: Unknown (12/12/2021)   Received from [facility], [facility]   Social Network    Social Network: Not on file  Intimate Partner Violence: Not At Risk (05/29/2023)   Humiliation, Afraid, Rape, and Kick questionnaire    Fear of Current or Ex-Partner: No    Emotionally Abused: No    Physically Abused: No    Sexually Abused: No    No Known Allergies  History reviewed. No pertinent family history.    Prior to Admission medications   Medication Sig Start Date End Date Taking? Authorizing Provider  amLODipine (NORVASC) 5 MG tablet Take 5 mg by mouth daily. 05/20/15   [provider]  aspirin EC 81 MG tablet Take 81 mg by mouth daily.    [provider]  atorvastatin (LIPITOR) 10 MG tablet Take 10 mg by mouth daily. 05/20/15   [provider]  cephALEXin (KEFLEX) 500 MG capsule Take 1  capsule (500 mg total) by mouth 4 (four) times daily. Patient not taking: Reported on 04/12/2017 06/26/15   Lorre Nick, MD  doxycycline (VIBRAMYCIN) 100 MG capsule Take 1 capsule (100 mg total) by mouth 2 (two) times daily. 04/12/17   Rise Mu, PA-C  ECHINACEA PO Take 1 tablet by mouth daily.    [provider]  HYDROcodone-acetaminophen (NORCO/VICODIN) 5-325 MG tablet Take 1-2 tablets by mouth every 4 (four) hours as needed. Patient not taking: Reported on 04/12/2017 06/26/15   Lorre Nick, MD  OVER THE COUNTER MEDICATION Take 1 tablet by mouth 2 (two) times daily. Antioxidant multi vitamin    [provider]    Physical  Exam: Vitals:   05/29/23 0500 05/29/23 0549 05/29/23 0604 05/29/23 0632  BP: 115/77   (!) 125/107  Pulse: (!) 39 88  81  Resp: 15 16  15   Temp:   97.9 F (36.6 C) 98.6 F (37 C)  TempSrc:   Oral Oral  SpO2: 92% 92%  91%  Weight:      Height:         General:  Appears in no acute distress Eyes:  PERRL, EOMI, normal lids, iris ENT:  grossly normal hearing, lips & tongue, mmm Neck:  no LAD, masses or thyromegaly Cardiovascular: irregular Respiratory: Diminished bilaterally,  Abdomen:  soft, ntnd, NABS Skin:  no rash or induration seen on limited exam Musculoskeletal: Extremities bilateral trace edema Neurologic: Awake alert Labs on Admission: I have personally reviewed following labs and imaging studies  CBC: Recent Labs  Lab 05/28/23 2317 05/29/23 0449  WBC 9.8 10.9*  NEUTROABS 8.5*  --   HGB 14.7 14.0  HCT 41.3 39.1  MCV 98.6 98.7  PLT 123* 113*   Basic Metabolic Panel: Recent Labs  Lab 05/28/23 2317  NA 136  K 3.6  CL 103  CO2 21*  GLUCOSE 116*  BUN 14  CREATININE 0.88  CALCIUM 9.0   GFR: Estimated Creatinine Clearance: 77.8 mL/min (by C-G formula based on SCr of 0.88 mg/dL). Liver Function Tests: Recent Labs  Lab 05/28/23 2317  AST 46*  ALT 51*  ALKPHOS 107  BILITOT 1.4*  PROT 7.7  ALBUMIN 4.1   No results for input(s): "LIPASE", "AMYLASE" in the last 168 hours. No results for input(s): "AMMONIA" in the last 168 hours. Coagulation Profile: Recent Labs  Lab 05/28/23 2317  INR 1.1   Cardiac Enzymes: No results for input(s): "CKTOTAL", "CKMB", "CKMBINDEX", "TROPONINI" in the last 168 hours. BNP (last 3 results) No results for input(s): "PROBNP" in the last 8760 hours. HbA1C: No results for input(s): "HGBA1C" in the last 72 hours. CBG: No results for input(s): "GLUCAP" in the last 168 hours. Lipid Profile: No results for input(s): "CHOL", "HDL", "LDLCALC", "TRIG", "CHOLHDL", "LDLDIRECT" in the last 72 hours. Thyroid Function Tests: No  results for input(s): "TSH", "T4TOTAL", "FREET4", "T3FREE", "THYROIDAB" in the last 72 hours. Anemia Panel: No results for input(s): "VITAMINB12", "FOLATE", "FERRITIN", "TIBC", "IRON", "RETICCTPCT" in the last 72 hours. Urine analysis:    Component Value Date/Time   COLORURINE YELLOW 06/26/2015 1905   APPEARANCEUR HAZY (A) 06/26/2015 1905   LABSPEC 1.011 06/26/2015 1905   PHURINE 5.5 06/26/2015 1905   GLUCOSEU NEGATIVE 06/26/2015 1905   HGBUR TRACE (A) 06/26/2015 1905   BILIRUBINUR NEGATIVE 06/26/2015 1905   KETONESUR NEGATIVE 06/26/2015 1905   PROTEINUR NEGATIVE 06/26/2015 1905   NITRITE NEGATIVE 06/26/2015 1905   LEUKOCYTESUR LARGE (A) 06/26/2015 1905    Creatinine Clearance: Estimated Creatinine  Clearance: 77.8 mL/min (by C-G formula based on SCr of 0.88 mg/dL).  Sepsis Labs: @LABRCNTIP (procalcitonin:4,lacticidven:4) ) Recent Results (from the past 240 hour(s))  Resp panel by RT-PCR (RSV, Flu A&B, Covid) Anterior Nasal Swab     Status: None   Collection Time: 05/28/23 11:17 PM   Specimen: Anterior Nasal Swab  Result Value Ref Range Status   SARS Coronavirus 2 by RT PCR NEGATIVE NEGATIVE Final    Comment: (NOTE) SARS-CoV-2 target nucleic acids are NOT DETECTED.  The SARS-CoV-2 RNA is generally detectable in upper respiratory specimens during the acute phase of infection. The lowest concentration of SARS-CoV-2 viral copies this assay can detect is 138 copies/mL. A negative result does not preclude SARS-Cov-2 infection and should not be used as the sole basis for treatment or other patient management decisions. A negative result may occur with  improper specimen collection/handling, submission of specimen other than nasopharyngeal swab, presence of viral mutation(s) within the areas targeted by this assay, and inadequate number of viral copies(<138 copies/mL). A negative result must be combined with clinical observations, patient history, and epidemiological information.  The expected result is Negative.  Fact Sheet for Patients:  BloggerCourse.com  Fact Sheet for Healthcare Providers:  SeriousBroker.it  This test is no t yet approved or cleared by the [place] FDA and  has been authorized for detection and/or diagnosis of SARS-CoV-2 by FDA under an Emergency Use Authorization (EUA). This EUA will remain  in effect (meaning this test can be used) for the duration of the COVID-19 declaration under Section 564(b)(1) of the Act, 21 U.S.C.section 360bbb-3(b)(1), unless the authorization is terminated  or revoked sooner.       Influenza A by PCR NEGATIVE NEGATIVE Final   Influenza B by PCR NEGATIVE NEGATIVE Final    Comment: (NOTE) The Xpert Xpress SARS-CoV-2/FLU/RSV plus assay is intended as an aid in the diagnosis of influenza from Nasopharyngeal swab specimens and should not be used as a sole basis for treatment. Nasal washings and aspirates are unacceptable for Xpert Xpress SARS-CoV-2/FLU/RSV testing.  Fact Sheet for Patients: BloggerCourse.com  Fact Sheet for Healthcare Providers: SeriousBroker.it  This test is not yet approved or cleared by the [place] FDA and has been authorized for detection and/or diagnosis of SARS-CoV-2 by FDA under an Emergency Use Authorization (EUA). This EUA will remain in effect (meaning this test can be used) for the duration of the COVID-19 declaration under Section 564(b)(1) of the Act, 21 U.S.C. section 360bbb-3(b)(1), unless the authorization is terminated or revoked.     Resp Syncytial Virus by PCR NEGATIVE NEGATIVE Final    Comment: (NOTE) Fact Sheet for Patients: BloggerCourse.com  Fact Sheet for Healthcare Providers: SeriousBroker.it  This test is not yet approved or cleared by the [place] FDA and has been authorized for detection and/or  diagnosis of SARS-CoV-2 by FDA under an Emergency Use Authorization (EUA). This EUA will remain in effect (meaning this test can be used) for the duration of the COVID-19 declaration under Section 564(b)(1) of the Act, 21 U.S.C. section 360bbb-3(b)(1), unless the authorization is terminated or revoked.  Performed at [facility], [address] [place], [place] [address]      Radiological Exams on Admission: DG Chest Portable 1 View  Result Date: 05/29/2023 CLINICAL DATA:  Suspected sepsis EXAM: PORTABLE CHEST 1 VIEW COMPARISON:  Chest x-ray 02/11/2023 FINDINGS: The heart is mildly enlarged. There central pulmonary vascular congestion. There are minimal patchy retrocardiac opacities. Costophrenic angles are clear. There is  no pneumothorax. IMPRESSION: 1. Mild cardiomegaly with central pulmonary vascular congestion. 2. Minimal patchy retrocardiac opacities, atelectasis versus infiltrate. Electronically Signed   By: Darliss Cheney M.D.   On: 05/29/2023 01:44    EKG: Independently reviewed.  A-fib  Assessment/Plan Principal Problem:   Atrial fibrillation with RVR (HCC)   #1 new onset A-fib in the setting of hypoxia secondary to community-acquired pneumonia-COVID RSV and influenza negative on admission.  Respiratory virus panel ordered.  CT angiogram concerning for pneumonia.  Started Rocephin and Flagyl. TSH pending. He has been started on Cardizem and heparin appreciate cardiology input. Echocardiogram pending Will start DOAC  #2 community-acquired pneumonia continue Rocephin and Flagyl started today follow-up Pro-Cal  #3 hypoxia secondary to #2 chest x-ray read as mild vascular congestion with mildly elevated BNP follow-up echo Not given any diuresis will defer to cardiology  #4 elevated troponin 1 61-1 54 trending down.'s  #5 elevated LFTs  #6 thrombocytopenia new likely due to #2 will follow 113 from 123   Estimated body mass index is 29.84 kg/m as calculated  from the following:   Height as of this encounter: 6' (1.829 m).   Weight as of this encounter: 99.8 kg.   DVT prophylaxis: Heparin Code Status: Full code Family Communication: Wife at bedside Disposition Plan: In patient Consults called: Cardiology Admission status: Inpatient   Alwyn Ren MD  05/29/2023, 9:35 AM

## 2023-05-30 ENCOUNTER — Inpatient Hospital Stay (HOSPITAL_COMMUNITY): Payer: Medicare Other

## 2023-05-30 ENCOUNTER — Other Ambulatory Visit (HOSPITAL_COMMUNITY): Payer: Self-pay

## 2023-05-30 DIAGNOSIS — I4891 Unspecified atrial fibrillation: Secondary | ICD-10-CM

## 2023-05-30 LAB — ECHOCARDIOGRAM COMPLETE
AR max vel: 2.19 cm2
AV Peak grad: 9.1 mm[Hg]
Ao pk vel: 1.51 m/s
Area-P 1/2: 4.8 cm2
Height: 72 in
MV M vel: 4.15 m/s
MV Peak grad: 68.9 mm[Hg]
S' Lateral: 3.7 cm
Weight: 3520.31 [oz_av]

## 2023-05-30 LAB — COMPREHENSIVE METABOLIC PANEL
ALT: 35 U/L (ref 0–44)
AST: 26 U/L (ref 15–41)
Albumin: 3.3 g/dL — ABNORMAL LOW (ref 3.5–5.0)
Alkaline Phosphatase: 78 U/L (ref 38–126)
Anion gap: 8 (ref 5–15)
BUN: 18 mg/dL (ref 8–23)
CO2: 22 mmol/L (ref 22–32)
Calcium: 9.2 mg/dL (ref 8.9–10.3)
Chloride: 102 mmol/L (ref 98–111)
Creatinine, Ser: 0.87 mg/dL (ref 0.61–1.24)
GFR, Estimated: >60 mL/min (ref >60)
Glucose, Bld: 121 mg/dL — ABNORMAL HIGH (ref 70–99)
Potassium: 3.7 mmol/L (ref 3.5–5.1)
Sodium: 132 mmol/L — ABNORMAL LOW (ref 135–145)
Total Bilirubin: 2.1 mg/dL — ABNORMAL HIGH (ref 0.3–1.2)
Total Protein: 6.6 g/dL (ref 6.5–8.1)

## 2023-05-30 LAB — CBC
HCT: 38.7 % — ABNORMAL LOW (ref 39.0–52.0)
Hemoglobin: 13.3 g/dL (ref 13.0–17.0)
MCH: 35.2 pg — ABNORMAL HIGH (ref 26.0–34.0)
MCHC: 34.4 g/dL (ref 30.0–36.0)
MCV: 102.4 fL — ABNORMAL HIGH (ref 80.0–100.0)
Platelets: 104 10*3/uL — ABNORMAL LOW (ref 150–400)
RBC: 3.78 MIL/uL — ABNORMAL LOW (ref 4.22–5.81)
RDW: 13.4 % (ref 11.5–15.5)
WBC: 6.5 10*3/uL (ref 4.0–10.5)
nRBC: 0 % (ref 0.0–0.2)

## 2023-05-30 LAB — LIPID PANEL
Cholesterol: 101 mg/dL (ref 0–200)
HDL: 50 mg/dL (ref >40)
LDL Cholesterol: 42 mg/dL (ref 0–99)
Total CHOL/HDL Ratio: 2 {ratio}
Triglycerides: 43 mg/dL (ref <150)
VLDL: 9 mg/dL (ref 0–40)

## 2023-05-30 LAB — HEMOGLOBIN A1C
Hgb A1c MFr Bld: 5.3 % (ref 4.8–5.6)
Mean Plasma Glucose: 105.41 mg/dL

## 2023-05-30 MED ORDER — DEXTROSE 5 % IV SOLN
500.0000 mg | INTRAVENOUS | Status: DC
  Administered 2023-05-31: 500 mg via INTRAVENOUS
  Filled 2023-05-30: qty 5

## 2023-05-30 MED ORDER — SODIUM CHLORIDE 0.9 % IV SOLN
500.0000 mg | INTRAVENOUS | Status: AC
  Administered 2023-05-30: 500 mg via INTRAVENOUS
  Filled 2023-05-30: qty 5

## 2023-05-30 MED ORDER — DILTIAZEM HCL ER COATED BEADS 180 MG PO CP24
360.0000 mg | ORAL_CAPSULE | Freq: Every day | ORAL | Status: DC
  Administered 2023-05-30 – 2023-05-31 (×2): 360 mg via ORAL
  Filled 2023-05-30 (×2): qty 2

## 2023-05-30 NOTE — Plan of Care (Signed)
°  Problem: Education: Goal: Knowledge of General Education information will improve Description: Including pain rating scale, medication(s)/side effects and non-pharmacologic comfort measures Outcome: Progressing   Problem: Clinical Measurements: Goal: Cardiovascular complication will be avoided Outcome: Progressing   Problem: Activity: Goal: Risk for activity intolerance will decrease Outcome: Progressing   Problem: Coping: Goal: Level of anxiety will decrease Outcome: Progressing

## 2023-05-30 NOTE — Progress Notes (Signed)
PROGRESS NOTE    JERMAR FERRETT  ION:[id number] DOB: 1941-01-12 DOA: 05/28/2023 PCP: Lorenda Ishihara, MD   Brief Narrative: MARGARET STEENSTRA is a 82 y.o. male with medical history significant of hyperlipidemia, hypertension, recent COVID, hemochromatosis admitted with fever and hypoxia and new onset atrial fibrillation.  Patient traveled to [place] recently for 2 weeks he noticed that he was more weak than usual .  After returning home patient started to have fever and cough.  His temp was 100.3 at home.  He complained of some diarrhea.  This has resolved.  I will with his wife who is asymptomatic and well.  He denied chest pain shortness of breath abdominal pain nausea vomiting.  He was febrile in the ER.  He reports he is active at baseline he works out he walks.  Patient was found to be hypoxic in ER and was placed on O2. Ct chest-No evidence of pulmonary embolism. Patchy consolidation in dependent portions of the lungs, greatest in the left lower lobe, suspicious for aspiration versus multifocal infection.Trace left pleural effusion.  Assessment & Plan:   Principal Problem:   Atrial fibrillation with RVR (HCC) Active Problems:   A-fib (HCC)   #1 new onset A-fib in the setting of hypoxia secondary to community-acquired pneumonia-COVID RSV and influenza negative on admission.  Respiratory virus panel ordered.  CT angiogram concerning for pneumonia.  Started Rocephin and Flagyl. TSH pending. He has been started on Cardizem and heparin appreciate cardiology input. Echocardiogram ejection fraction 50 to 55% mildly elevated pulmonary artery systolic pressure  started [facility] has changed Cardizem to p.o. today   #2 community-acquired pneumonia continue Rocephin and Flagyl  Still requiring 4 L oxygen to keep saturation above 92 Will encourage the patient to get out of bed ambulate and check ambulatory oxygen saturation   #3 hypoxia secondary to #2 chest x-ray read as mild  vascular congestion with mildly elevated BNP follow-up echo Not given any diuresis    #4 elevated troponin -likely from demand 1 61-1 54 trending down. EKG no acute ST-T wave changes   #5 elevated LFTs likely from 2 resolved   #6 thrombocytopenia new likely due to #2 will follow 104 from 113 from 123     Estimated body mass index is 29.84 kg/m as calculated from the following:   Height as of this encounter: 6' (1.829 m).   Weight as of this encounter: 99.8 kg.  DVT prophylaxis: Eliquis Code Status: Full code Family Communication: Wife at bedside Disposition Plan:  Status is: Inpatient Remains inpatient appropriate because: New onset A-fib and hypoxia   Consultants: Cardiology  Procedures: None Antimicrobials Rocephin azithromycin Subjective:  He is resting in bed he feels better than yesterday still on 4 L of oxygen slept with trazodone Objective: Vitals:   05/29/23 1450 05/29/23 1841 05/29/23 2047 05/30/23 0524  BP: 119/73 109/77 118/82 102/71  Pulse: 86 82 75 66  Resp: 17 18 16 16   Temp: 98.2 F (36.8 C) 98.5 F (36.9 C) 98.5 F (36.9 C) 98.3 F (36.8 C)  TempSrc: Oral Oral Oral Oral  SpO2: 93% 94% 90% 90%  Weight:      Height:        Intake/Output Summary (Last 24 hours) at 05/30/2023 1048 Last data filed at 05/29/2023 2216 Gross per 24 hour  Intake 1461.22 ml  Output 400 ml  Net 1061.22 ml   Filed Weights   05/28/23 2254  Weight: 99.8 kg    Examination:  General exam: Appears  calm and comfortable  Respiratory system: Scattered rhonchi to auscultation. Respiratory effort normal. Cardiovascular system: S1 & S2 heard, RRR. No JVD, murmurs, rubs, gallops or clicks. No pedal edema. Gastrointestinal system: Abdomen is nondistended, soft and nontender. No organomegaly or masses felt. Normal bowel sounds heard. Central nervous system: Alert and oriented. No focal neurological deficits. Extremities: Trace bilateral pitting edema Data Reviewed: I have  personally reviewed following labs and imaging studies  CBC: Recent Labs  Lab 05/28/23 2317 05/29/23 0449 05/30/23 0505  WBC 9.8 10.9* 6.5  NEUTROABS 8.5*  --   --   HGB 14.7 14.0 13.3  HCT 41.3 39.1 38.7*  MCV 98.6 98.7 102.4*  PLT 123* 113* 104*   Basic Metabolic Panel: Recent Labs  Lab 05/28/23 2317 05/30/23 0505  NA 136 132*  K 3.6 3.7  CL 103 102  CO2 21* 22  GLUCOSE 116* 121*  BUN 14 18  CREATININE 0.88 0.87  CALCIUM 9.0 9.2   GFR: Estimated Creatinine Clearance: 78.7 mL/min (by C-G formula based on SCr of 0.87 mg/dL). Liver Function Tests: Recent Labs  Lab 05/28/23 2317 05/30/23 0505  AST 46* 26  ALT 51* 35  ALKPHOS 107 78  BILITOT 1.4* 2.1*  PROT 7.7 6.6  ALBUMIN 4.1 3.3*   No results for input(s): "LIPASE", "AMYLASE" in the last 168 hours. No results for input(s): "AMMONIA" in the last 168 hours. Coagulation Profile: Recent Labs  Lab 05/28/23 2317  INR 1.1   Cardiac Enzymes: No results for input(s): "CKTOTAL", "CKMB", "CKMBINDEX", "TROPONINI" in the last 168 hours. BNP (last 3 results) No results for input(s): "PROBNP" in the last 8760 hours. HbA1C: Recent Labs    05/30/23 0505  HGBA1C 5.3   CBG: No results for input(s): "GLUCAP" in the last 168 hours. Lipid Profile: Recent Labs    05/30/23 0505  CHOL 101  HDL 50  LDLCALC 42  TRIG 43  CHOLHDL 2.0   Thyroid Function Tests: Recent Labs    05/29/23 1229  TSH 2.459   Anemia Panel: No results for input(s): "VITAMINB12", "FOLATE", "FERRITIN", "TIBC", "IRON", "RETICCTPCT" in the last 72 hours. Sepsis Labs: Recent Labs  Lab 05/28/23 2317 05/29/23 1228  PROCALCITON  --  0.77  LATICACIDVEN 1.7  --     Recent Results (from the past 240 hour(s))  Culture, blood (Routine x 2)     Status: None (Preliminary result)   Collection Time: 05/28/23 11:17 PM   Specimen: BLOOD  Result Value Ref Range Status   Specimen Description   Final    BLOOD RIGHT ANTECUBITAL Performed at [facility], [address] [place], [place] [address]    Special Requests   Final    BOTTLES DRAWN AEROBIC AND ANAEROBIC Blood Culture results may not be optimal due to an excessive volume of blood received in culture bottles Performed at [facility], [address] [place], [place] [address]    Culture   Final    NO GROWTH < 24 HOURS Performed at [facility], [address], [place], [place] [address]    Report Status PENDING  Incomplete  Resp panel by RT-PCR (RSV, Flu A&B, Covid) Anterior Nasal Swab     Status: None   Collection Time: 05/28/23 11:17 PM   Specimen: Anterior Nasal Swab  Result Value Ref Range Status   SARS Coronavirus 2 by RT PCR NEGATIVE NEGATIVE Final    Comment: (NOTE) SARS-CoV-2 target nucleic acids are NOT DETECTED.  The SARS-CoV-2 RNA is generally detectable in upper respiratory specimens during the acute phase of infection. The lowest concentration of SARS-CoV-2 viral copies this assay can detect is 138 copies/mL. A negative result does not preclude SARS-Cov-2 infection and should not be used as the sole basis for treatment or other patient management decisions. A negative result may occur with  improper specimen collection/handling, submission of specimen other than nasopharyngeal swab, presence of viral mutation(s) within the areas targeted by this assay, and inadequate number of viral copies(<138 copies/mL). A negative result must be combined with clinical observations, patient history, and epidemiological information. The expected result is Negative.  Fact Sheet for Patients:  BloggerCourse.com  Fact Sheet for Healthcare Providers:  SeriousBroker.it  This test is no t yet approved or cleared by the [place] FDA and  has been authorized for detection and/or diagnosis of SARS-CoV-2 by FDA under an Emergency Use Authorization (EUA). This EUA will remain  in effect  (meaning this test can be used) for the duration of the COVID-19 declaration under Section 564(b)(1) of the Act, 21 U.S.C.section 360bbb-3(b)(1), unless the authorization is terminated  or revoked sooner.       Influenza A by PCR NEGATIVE NEGATIVE Final   Influenza B by PCR NEGATIVE NEGATIVE Final    Comment: (NOTE) The Xpert Xpress SARS-CoV-2/FLU/RSV plus assay is intended as an aid in the diagnosis of influenza from Nasopharyngeal swab specimens and should not be used as a sole basis for treatment. Nasal washings and aspirates are unacceptable for Xpert Xpress SARS-CoV-2/FLU/RSV testing.  Fact Sheet for Patients: BloggerCourse.com  Fact Sheet for Healthcare Providers: SeriousBroker.it  This test is not yet approved or cleared by the [place] FDA and has been authorized for detection and/or diagnosis of SARS-CoV-2 by FDA under an Emergency Use Authorization (EUA). This EUA will remain in effect (meaning this test can be used) for the duration of the COVID-19 declaration under Section 564(b)(1) of the Act, 21 U.S.C. section 360bbb-3(b)(1), unless the authorization is terminated or revoked.     Resp Syncytial Virus by PCR NEGATIVE NEGATIVE Final    Comment: (NOTE) Fact Sheet for Patients: BloggerCourse.com  Fact Sheet for Healthcare Providers: SeriousBroker.it  This test is not yet approved or cleared by the [place] FDA and has been authorized for detection and/or diagnosis of SARS-CoV-2 by FDA under an Emergency Use Authorization (EUA). This EUA will remain in effect (meaning this test can be used) for the duration of the COVID-19 declaration under Section 564(b)(1) of the Act, 21 U.S.C. section 360bbb-3(b)(1), unless the authorization is terminated or revoked.  Performed at [facility], [address] [place], [place] [address]   Culture, blood  (Routine x 2)     Status: None (Preliminary result)   Collection Time: 05/28/23 11:18 PM   Specimen: BLOOD  Result Value Ref Range Status   Specimen Description   Final    BLOOD LEFT ANTECUBITAL Performed at [facility], [address] [place], [place] [address]    Special Requests   Final    BOTTLES DRAWN AEROBIC AND ANAEROBIC Blood Culture results may not be optimal due to an excessive volume of blood received in culture bottles Performed at [facility], [address] [place], [place] [address]    Culture   Final    NO GROWTH < 24 HOURS Performed at [facility], [address], [place], [place] [address]    Report Status PENDING  Incomplete  Respiratory (~20 pathogens) panel by PCR     Status: None   Collection Time: 05/29/23  9:19 AM   Specimen: Nasopharyngeal Swab; Respiratory  Result Value Ref Range Status   Adenovirus NOT DETECTED NOT DETECTED Final   Coronavirus 229E NOT DETECTED NOT DETECTED Final    Comment: (NOTE) The Coronavirus on the Respiratory Panel, DOES NOT test for the novel  Coronavirus (2019 nCoV)    Coronavirus HKU1 NOT DETECTED NOT DETECTED Final   Coronavirus NL63 NOT DETECTED NOT DETECTED Final   Coronavirus OC43 NOT DETECTED NOT DETECTED Final   Metapneumovirus NOT DETECTED NOT DETECTED Final   Rhinovirus / Enterovirus NOT DETECTED NOT DETECTED Final   Influenza A NOT DETECTED NOT DETECTED Final   Influenza B NOT DETECTED NOT DETECTED Final   Parainfluenza Virus 1 NOT DETECTED NOT DETECTED Final   Parainfluenza Virus 2 NOT DETECTED NOT DETECTED Final   Parainfluenza Virus 3 NOT DETECTED NOT DETECTED Final   Parainfluenza Virus 4 NOT DETECTED NOT DETECTED Final   Respiratory Syncytial Virus NOT DETECTED NOT DETECTED Final   Bordetella pertussis NOT DETECTED NOT DETECTED Final   Bordetella Parapertussis NOT DETECTED NOT DETECTED Final   Chlamydophila pneumoniae NOT DETECTED NOT DETECTED Final   Mycoplasma pneumoniae  NOT DETECTED NOT DETECTED Final    Comment: Performed at [facility], [address] [place], [place] [address]         Radiology Studies: ECHOCARDIOGRAM COMPLETE  Result Date: 05/30/2023    ECHOCARDIOGRAM REPORT   Patient Name:   LIZANDRO HINTON Date of Exam: 05/30/2023 Medical Rec #:  [id number]          Height:       72.0 in Accession #:    [id number]         Weight:       220.0 lb Date of Birth:  11/15/1940          BSA:          2.219 m Patient Age:    82 years           BP:           102/71 mmHg Patient Gender: M                  HR:           66 bpm. Exam Location:  Inpatient Procedure: 2D Echo, Cardiac Doppler and Color Doppler Indications:    Atrial Fibrillation I48.91  History:        Patient has no prior history of Echocardiogram examinations.                 Arrythmias:Atrial Fibrillation; Risk Factors:Hypertension and                 Dyslipidemia.  Sonographer:    Lucendia Herrlich RCS Referring Phys: [id number] DAYNA N DUNN IMPRESSIONS  1. Wall motion assessment challenging with afib. Left ventricular ejection fraction, by estimation, is 50 to 55%. The left ventricle has low normal function. Left ventricular diastolic parameters are indeterminate.  2. Right ventricular systolic function is normal. The right ventricular size is normal. There is mildly elevated pulmonary artery systolic pressure. The estimated right ventricular systolic pressure is 38.8 mmHg.  3. Left atrial size was mildly dilated.  4. Right atrial size was mildly dilated.  5. The mitral valve is degenerative. Mild mitral valve regurgitation.  6. The aortic valve is grossly normal. Aortic valve regurgitation is not  visualized.  7. The inferior vena cava is dilated in size with <50% respiratory variability, suggesting right atrial pressure of 15 mmHg. FINDINGS  Left Ventricle: Wall motion assessment challenging with afib. Left ventricular ejection fraction, by estimation, is 50 to 55%. The left ventricle has low normal  function. The left ventricular internal cavity size was normal in size. There is no left ventricular hypertrophy. Left ventricular diastolic parameters are indeterminate. Right Ventricle: The right ventricular size is normal. Right ventricular systolic function is normal. There is mildly elevated pulmonary artery systolic pressure. The tricuspid regurgitant velocity is 2.44 m/s, and with an assumed right atrial pressure of 15 mmHg, the estimated right ventricular systolic pressure is 38.8 mmHg. Left Atrium: Left atrial size was mildly dilated. Right Atrium: Right atrial size was mildly dilated. Pericardium: There is no evidence of pericardial effusion. Mitral Valve: The mitral valve is degenerative in appearance. Mild mitral valve regurgitation. Tricuspid Valve: Tricuspid valve regurgitation is mild. Aortic Valve: The aortic valve is grossly normal. Aortic valve regurgitation is not visualized. Aortic valve peak gradient measures 9.1 mmHg. Pulmonic Valve: The pulmonic valve was not well visualized. Pulmonic valve regurgitation is not visualized. Aorta: The aortic root and ascending aorta are structurally normal, with no evidence of dilitation. Venous: The inferior vena cava is dilated in size with less than 50% respiratory variability, suggesting right atrial pressure of 15 mmHg. IAS/Shunts: No atrial level shunt detected by color flow Doppler.  LEFT VENTRICLE PLAX 2D LVIDd:         5.00 cm   Diastology LVIDs:         3.70 cm   LV e' medial:    12.30 cm/s LV PW:         0.80 cm   LV E/e' medial:  10.7 LV IVS:        0.80 cm   LV e' lateral:   12.40 cm/s LVOT diam:     2.00 cm   LV E/e' lateral: 10.6 LV SV:         53 LV SV Index:   24 LVOT Area:     3.14 cm  RIGHT VENTRICLE             IVC RV S prime:     16.60 cm/s  IVC diam: 2.90 cm TAPSE (M-mode): 1.2 cm LEFT ATRIUM             Index        RIGHT ATRIUM           Index LA diam:        3.80 cm 1.71 cm/m   RA Area:     24.10 cm LA Vol (A2C):   77.1 ml 34.75  ml/m  RA Volume:   74.70 ml  33.67 ml/m LA Vol (A4C):   66.9 ml 30.15 ml/m LA Biplane Vol: 70.4 ml 31.73 ml/m  AORTIC VALVE AV Area (Vmax): 2.19 cm AV Vmax:        151.00 cm/s AV Peak Grad:   9.1 mmHg LVOT Vmax:      105.33 cm/s LVOT Vmean:     67.733 cm/s LVOT VTI:       0.168 m  AORTA Ao Root diam: 3.40 cm Ao Asc diam:  3.90 cm MITRAL VALVE                TRICUSPID VALVE MV Area (PHT): 4.80 cm     TR Peak grad:   23.8 mmHg MV Decel Time: 158 msec  TR Vmax:        244.00 cm/s MR Peak grad: 68.9 mmHg MR Vmax:      415.00 cm/s   SHUNTS MV E velocity: 132.00 cm/s  Systemic VTI:  0.17 m                             Systemic Diam: 2.00 cm Carolan Clines Electronically signed by Carolan Clines Signature Date/Time: 05/30/2023/10:16:17 AM    Final    CT Angio Chest Pulmonary Embolism (PE) W or WO Contrast  Result Date: 05/29/2023 CLINICAL DATA:  Pulmonary embolism (PE) suspected, low to intermediate prob, neg D-dimer. Possible sepsis. EXAM: CT ANGIOGRAPHY CHEST WITH CONTRAST TECHNIQUE: Multidetector CT imaging of the chest was performed using the standard protocol during bolus administration of intravenous contrast. Multiplanar CT image reconstructions and MIPs were obtained to evaluate the vascular anatomy. RADIATION DOSE REDUCTION: This exam was performed according to the departmental dose-optimization program which includes automated exposure control, adjustment of the mA and/or kV according to patient size and/or use of iterative reconstruction technique. CONTRAST:  75mL OMNIPAQUE IOHEXOL 350 MG/ML SOLN COMPARISON:  Chest radiograph 05/28/2023. FINDINGS: Cardiovascular: Satisfactory opacification of the pulmonary arteries to the segmental level. No evidence of pulmonary embolism. Mild cardiomegaly. No pericardial effusion. Extensive atherosclerotic calcifications of the coronary arteries. Aortic atherosclerosis. Mediastinum/Nodes: No suspicious thoracic lymphadenopathy. Trachea and esophagus are unremarkable.  Lungs/Pleura: Patchy consolidation in dependent portions of the lungs, greatest in the left lower lobe, suspicious for aspiration versus multifocal infection. Trace left pleural effusion. No pneumothorax. Upper Abdomen: No acute findings. Musculoskeletal: No chest wall abnormality. No acute or significant osseous findings. Review of the MIP images confirms the above findings. IMPRESSION: 1. No evidence of pulmonary embolism. 2. Patchy consolidation in dependent portions of the lungs, greatest in the left lower lobe, suspicious for aspiration versus multifocal infection. 3. Trace left pleural effusion. Aortic Atherosclerosis (ICD10-I70.0). Electronically Signed   By: Orvan Falconer M.D.   On: 05/29/2023 13:45   DG Chest Portable 1 View  Result Date: 05/29/2023 CLINICAL DATA:  Suspected sepsis EXAM: PORTABLE CHEST 1 VIEW COMPARISON:  Chest x-ray 02/11/2023 FINDINGS: The heart is mildly enlarged. There central pulmonary vascular congestion. There are minimal patchy retrocardiac opacities. Costophrenic angles are clear. There is no pneumothorax. IMPRESSION: 1. Mild cardiomegaly with central pulmonary vascular congestion. 2. Minimal patchy retrocardiac opacities, atelectasis versus infiltrate. Electronically Signed   By: Darliss Cheney M.D.   On: 05/29/2023 01:44     Scheduled Meds:  apixaban  5 mg Oral BID   atorvastatin  10 mg Oral Daily   traZODone  50 mg Oral QHS   Continuous Infusions:  azithromycin 500 mg (05/29/23 1537)   cefTRIAXone (ROCEPHIN)  IV 1 g (05/29/23 1345)   diltiazem (CARDIZEM) infusion 15 mg/hr (05/30/23 0110)     LOS: 1 day    Time spent: 39 min  Alwyn Ren, MD  05/30/2023, 10:48 AM

## 2023-05-30 NOTE — Progress Notes (Signed)
Rounding Note    Patient Name: Drew Calderon Date of Encounter: 05/30/2023  [facility] Cardiologist: Jodelle Red, MD   Subjective   Denies any CP or SOB. Breathing is good.   Inpatient Medications    Scheduled Meds:  apixaban  5 mg Oral BID   atorvastatin  10 mg Oral Daily   traZODone  50 mg Oral QHS   Continuous Infusions:  azithromycin 500 mg (05/29/23 1537)   cefTRIAXone (ROCEPHIN)  IV 1 g (05/29/23 1345)   diltiazem (CARDIZEM) infusion 15 mg/hr (05/30/23 0110)   PRN Meds:    Vital Signs    Vitals:   05/29/23 1450 05/29/23 1841 05/29/23 2047 05/30/23 0524  BP: 119/73 109/77 118/82 102/71  Pulse: 86 82 75 66  Resp: 17 18 16 16   Temp: 98.2 F (36.8 C) 98.5 F (36.9 C) 98.5 F (36.9 C) 98.3 F (36.8 C)  TempSrc: Oral Oral Oral Oral  SpO2: 93% 94% 90% 90%  Weight:      Height:        Intake/Output Summary (Last 24 hours) at 05/30/2023 0817 Last data filed at 05/29/2023 2216 Gross per 24 hour  Intake 1821.22 ml  Output 400 ml  Net 1421.22 ml      05/28/2023   10:54 PM 04/23/2023   11:15 AM 09/06/2022    9:53 AM  Last 3 Weights  Weight (lbs) 220 lb 0.3 oz 220 lb 213 lb  Weight (kg) 99.8 kg 99.791 kg 96.616 kg      Telemetry    Atrial fibrillation with HR 70-80s overnight - Personally Reviewed  ECG    Atrial fibrillation with RVR 05/28/2023 - Personally Reviewed  Physical Exam   GEN: No acute distress.   Neck: No JVD Cardiac: irregularly irregular, no murmurs, rubs, or gallops.  Respiratory: Clear to auscultation bilaterally. GI: Soft, nontender, non-distended  MS: No edema; No deformity. Neuro:  Nonfocal  Psych: Normal affect   Labs    High Sensitivity Troponin:   Recent Labs  Lab 05/28/23 2317 05/29/23 0100  TROPONINIHS 161* 154*     Chemistry Recent Labs  Lab 05/28/23 2317 05/30/23 0505  NA 136 132*  K 3.6 3.7  CL 103 102  CO2 21* 22  GLUCOSE 116* 121*  BUN 14 18  CREATININE 0.88 0.87   CALCIUM 9.0 9.2  PROT 7.7 6.6  ALBUMIN 4.1 3.3*  AST 46* 26  ALT 51* 35  ALKPHOS 107 78  BILITOT 1.4* 2.1*  GFRNONAA >60 >60  ANIONGAP 12 8    Lipids  Recent Labs  Lab 05/30/23 0505  CHOL 101  TRIG 43  HDL 50  LDLCALC 42  CHOLHDL 2.0    Hematology Recent Labs  Lab 05/28/23 2317 05/29/23 0449 05/30/23 0505  WBC 9.8 10.9* 6.5  RBC 4.19* 3.96* 3.78*  HGB 14.7 14.0 13.3  HCT 41.3 39.1 38.7*  MCV 98.6 98.7 102.4*  MCH 35.1* 35.4* 35.2*  MCHC 35.6 35.8 34.4  RDW 13.2 13.2 13.4  PLT 123* 113* 104*   Thyroid  Recent Labs  Lab 05/29/23 1229  TSH 2.459    BNP Recent Labs  Lab 05/28/23 2317  BNP 219.3*    DDimer No results for input(s): "DDIMER" in the last 168 hours.   Radiology    CT Angio Chest Pulmonary Embolism (PE) W or WO Contrast  Result Date: 05/29/2023 CLINICAL DATA:  Pulmonary embolism (PE) suspected, low to intermediate prob, neg D-dimer. Possible sepsis. EXAM: CT ANGIOGRAPHY CHEST WITH  CONTRAST TECHNIQUE: Multidetector CT imaging of the chest was performed using the standard protocol during bolus administration of intravenous contrast. Multiplanar CT image reconstructions and MIPs were obtained to evaluate the vascular anatomy. RADIATION DOSE REDUCTION: This exam was performed according to the departmental dose-optimization program which includes automated exposure control, adjustment of the mA and/or kV according to patient size and/or use of iterative reconstruction technique. CONTRAST:  75mL OMNIPAQUE IOHEXOL 350 MG/ML SOLN COMPARISON:  Chest radiograph 05/28/2023. FINDINGS: Cardiovascular: Satisfactory opacification of the pulmonary arteries to the segmental level. No evidence of pulmonary embolism. Mild cardiomegaly. No pericardial effusion. Extensive atherosclerotic calcifications of the coronary arteries. Aortic atherosclerosis. Mediastinum/Nodes: No suspicious thoracic lymphadenopathy. Trachea and esophagus are unremarkable. Lungs/Pleura: Patchy  consolidation in dependent portions of the lungs, greatest in the left lower lobe, suspicious for aspiration versus multifocal infection. Trace left pleural effusion. No pneumothorax. Upper Abdomen: No acute findings. Musculoskeletal: No chest wall abnormality. No acute or significant osseous findings. Review of the MIP images confirms the above findings. IMPRESSION: 1. No evidence of pulmonary embolism. 2. Patchy consolidation in dependent portions of the lungs, greatest in the left lower lobe, suspicious for aspiration versus multifocal infection. 3. Trace left pleural effusion. Aortic Atherosclerosis (ICD10-I70.0). Electronically Signed   By: Orvan Falconer M.D.   On: 05/29/2023 13:45   DG Chest Portable 1 View  Result Date: 05/29/2023 CLINICAL DATA:  Suspected sepsis EXAM: PORTABLE CHEST 1 VIEW COMPARISON:  Chest x-ray 02/11/2023 FINDINGS: The heart is mildly enlarged. There central pulmonary vascular congestion. There are minimal patchy retrocardiac opacities. Costophrenic angles are clear. There is no pneumothorax. IMPRESSION: 1. Mild cardiomegaly with central pulmonary vascular congestion. 2. Minimal patchy retrocardiac opacities, atelectasis versus infiltrate. Electronically Signed   By: Darliss Cheney M.D.   On: 05/29/2023 01:44    Cardiac Studies   N/A  Patient Profile     82 y.o. male with PMH of HTN, HLD, EtOH use (1-2 glasses of wine every night), hemochromatosis who presented with afib with RVR and PNA.  Assessment & Plan    Newly diagnosed atrial fibrillation with RVR  - TSH normal. H/o 1-2 glasses of wine every night.  - likely exacerbated by pneumonia. Started on IV diltiazem and Eliquis. - pending echocardiogram to assess EF and left atrial size. Currently HR 70s-80s on 15 ml/hr of IV diltiazem, if EF is normal, then will transition to 360mg  oral Cardizem CD. If EF is low, will consider switch to metoprolol. Given well controlled HR, will reassess as outpatient in 3 weeks before  outpatient DCCV.   CAP: with hypoxia, viral panel negative, strep pneumo antigen nonreactive, CTA negative for PE, but concerning for LLL PNA. per primary team  Elevated troponin: Serial hs trop 161 --> 154, likely demand ischemia in the setting of PNA and afib with RVR  Mild thrombocytopenia      For questions or updates, please contact [facility] Please consult [email or web address] for contact info under        Signed, Azalee Course, PA  05/30/2023, 8:17 AM

## 2023-05-30 NOTE — Progress Notes (Signed)
Echocardiogram 2D Echocardiogram has been performed.  Drew Calderon 05/30/2023, 9:02 AM

## 2023-05-31 DIAGNOSIS — I4891 Unspecified atrial fibrillation: Secondary | ICD-10-CM | POA: Diagnosis not present

## 2023-05-31 LAB — COMPREHENSIVE METABOLIC PANEL
ALT: 37 U/L (ref 0–44)
AST: 28 U/L (ref 15–41)
Albumin: 3.2 g/dL — ABNORMAL LOW (ref 3.5–5.0)
Alkaline Phosphatase: 84 U/L (ref 38–126)
Anion gap: 12 (ref 5–15)
BUN: 20 mg/dL (ref 8–23)
CO2: 23 mmol/L (ref 22–32)
Calcium: 9.3 mg/dL (ref 8.9–10.3)
Chloride: 102 mmol/L (ref 98–111)
Creatinine, Ser: 0.79 mg/dL (ref 0.61–1.24)
GFR, Estimated: >60 mL/min (ref >60)
Glucose, Bld: 129 mg/dL — ABNORMAL HIGH (ref 70–99)
Potassium: 3.3 mmol/L — ABNORMAL LOW (ref 3.5–5.1)
Sodium: 137 mmol/L (ref 135–145)
Total Bilirubin: 1.4 mg/dL — ABNORMAL HIGH (ref 0.3–1.2)
Total Protein: 6.7 g/dL (ref 6.5–8.1)

## 2023-05-31 LAB — CBC
HCT: 37.7 % — ABNORMAL LOW (ref 39.0–52.0)
Hemoglobin: 12.7 g/dL — ABNORMAL LOW (ref 13.0–17.0)
MCH: 34.6 pg — ABNORMAL HIGH (ref 26.0–34.0)
MCHC: 33.7 g/dL (ref 30.0–36.0)
MCV: 102.7 fL — ABNORMAL HIGH (ref 80.0–100.0)
Platelets: 134 10*3/uL — ABNORMAL LOW (ref 150–400)
RBC: 3.67 MIL/uL — ABNORMAL LOW (ref 4.22–5.81)
RDW: 13.2 % (ref 11.5–15.5)
WBC: 5.3 10*3/uL (ref 4.0–10.5)
nRBC: 0 % (ref 0.0–0.2)

## 2023-05-31 LAB — LEGIONELLA PNEUMOPHILA SEROGP 1 UR AG: L. pneumophila Serogp 1 Ur Ag: NEGATIVE

## 2023-05-31 MED ORDER — APIXABAN 5 MG PO TABS: 5.0000 mg | ORAL_TABLET | Freq: Two times a day (BID) | ORAL | 4 refills | Status: DC

## 2023-05-31 MED ORDER — POTASSIUM CHLORIDE CRYS ER 20 MEQ PO TBCR: 40.0000 meq | EXTENDED_RELEASE_TABLET | Freq: Once | ORAL | Status: DC

## 2023-05-31 MED ORDER — AMOXICILLIN-POT CLAVULANATE 500-125 MG PO TABS
1.0000 | ORAL_TABLET | Freq: Three times a day (TID) | ORAL | 0 refills | Status: AC
Start: 2023-05-31 — End: ?

## 2023-05-31 MED ORDER — DILTIAZEM HCL ER COATED BEADS 360 MG PO CP24: 360.0000 mg | ORAL_CAPSULE | Freq: Every day | ORAL | 4 refills | Status: DC

## 2023-05-31 NOTE — TOC Transition Note (Signed)
Transition of Care [facility]) - CM/SW Discharge Note   Patient Details  Name: CAELAN BRANDEN MRN: [id number] Date of Birth: Sep 13, 1940  [facility] CM/SW Contact:  Lanier Clam, RN Phone Number: 05/31/2023, 1:51 PM   Clinical Narrative:  d/c home no needs or orders.     Final next level of care: Home/Self Care Barriers to Discharge: No Barriers Identified   Patient Goals and CMS Choice CMS Medicare.gov Compare Post Acute Care list provided to:: Patient Choice offered to / list presented to : Patient  Discharge Placement                         Discharge Plan and Services Additional resources added to the After Visit Summary for     Discharge Planning Services: CM Consult                                 Social Determinants of Health (SDOH) Interventions SDOH Screenings   Food Insecurity: No Food Insecurity (05/29/2023)  Housing: Low Risk  (05/29/2023)  Transportation Needs: No Transportation Needs (05/29/2023)  Utilities: Not At Risk (05/29/2023)  Social Connections: Unknown (12/12/2021)   Received from [facility]  Tobacco Use: Medium Risk (05/28/2023)     Readmission Risk Interventions     No data to display

## 2023-05-31 NOTE — Plan of Care (Signed)
  Problem: Education: Goal: Knowledge of General Education information will improve Description: Including pain rating scale, medication(s)/side effects and non-pharmacologic comfort measures Outcome: Progressing   Problem: Clinical Measurements: Goal: Diagnostic test results will improve Outcome: Progressing Goal: Respiratory complications will improve Outcome: Progressing Goal: Cardiovascular complication will be avoided Outcome: Progressing   Problem: Activity: Goal: Risk for activity intolerance will decrease Outcome: Progressing   Problem: Coping: Goal: Level of anxiety will decrease Outcome: Progressing

## 2023-05-31 NOTE — Progress Notes (Signed)
AVS reviewed w/ pt & wife who verbalized an understanding. Pt will hold on resuming vitamins & herbs until cleared by cardiologist or PCP. Antbx infusing to R AC fossa. PIV removed from L FA as noted.

## 2023-05-31 NOTE — Discharge Summary (Signed)
Physician Discharge Summary  Drew Calderon WUJ:[id number] DOB: 1941/07/29 DOA: 05/28/2023  PCP: Lorenda Ishihara, MD  Admit date: 05/28/2023 Discharge date: 05/31/2023  Admitted From: Home Disposition: Home  Recommendations for Outpatient Follow-up:  Follow up with PCP in 1-2 weeks Please obtain BMP/CBC in one week Please follow up with cardiology Dr. Cristal Deer for new onset A-fib  Home Health: None none Equipment/Devices: None  Discharge Condition: Stable  CODE STATUS: Full code Diet recommendation: Cardiac  Brief/Interim Summary:   Drew Calderon is a 82 y.o. male with medical history significant of hyperlipidemia, hypertension, recent COVID, hemochromatosis admitted with fever and hypoxia and new onset atrial fibrillation.  Patient traveled to [place] recently for 2 weeks he noticed that he was more weak than usual .  After returning home patient started to have fever and cough.  His temp was 100.3 at home.  He complained of some diarrhea.  This has resolved.  I will with his wife who is asymptomatic and well.  He denied chest pain shortness of breath abdominal pain nausea vomiting.  He was febrile in the ER.  He reports he is active at baseline he works out he walks.  Patient was found to be hypoxic in ER and was placed on O2. Ct chest-No evidence of pulmonary embolism. Patchy consolidation in dependent portions of the lungs, greatest in the left lower lobe, suspicious for aspiration versus multifocal infection.Trace left pleural effusion.   Discharge Diagnoses:  Principal Problem:   Atrial fibrillation with RVR (HCC) Active Problems:   A-fib (HCC)  #1 new onset A-fib in the setting of hypoxia secondary to community-acquired pneumonia-COVID RSV and influenza negative on admission.  Respiratory virus panel negative.  TSH normal.  CT chest/concerning for pneumonia.  No pulmonary embolism he was treated with Rocephin and Flagyl.   He was treated with Cardizem and  heparin drip initially changed to p.o. Cardizem and Eliquis on discharge.  Seen by cardiology Dr. Cristal Deer and he will follow-up with her. Echocardiogram ejection fraction 50 to 55% mildly elevated pulmonary artery systolic pressure.   #2 community-acquired pneumonia continue Rocephin and Flagyl  He was discharged on Augmentin.  He ambulated in the hallway prior to discharge maintaining his saturation above 92% on room air.    #3 elevated troponin -this was thought to be demand induced.  EKG showed no acute findings other than new A-fib.    #5 elevated LFTs resolved   #6 thrombocytopenia improving 134 from 104 from 113 from 123     Estimated body mass index is 29.84 kg/m as calculated from the following:   Height as of this encounter: 6' (1.829 m).   Weight as of this encounter: 99.8 kg.  Discharge Instructions  Discharge Instructions     Amb referral to AFIB Clinic   Complete by: As directed    Diet - low sodium heart healthy   Complete by: As directed    Increase activity slowly   Complete by: As directed       Allergies as of 05/31/2023   No Known Allergies      Medication List     STOP taking these medications    amLODipine 5 MG tablet Commonly known as: NORVASC   hydrochlorothiazide 25 MG tablet Commonly known as: HYDRODIURIL   lisinopril 10 MG tablet Commonly known as: ZESTRIL       TAKE these medications    allopurinol 100 MG tablet Commonly known as: ZYLOPRIM Take 100 mg by mouth daily.   amoxicillin-clavulanate  500-125 MG tablet Commonly known as: Augmentin Take 1 tablet by mouth 3 (three) times daily.   apixaban 5 MG Tabs tablet Commonly known as: ELIQUIS Take 1 tablet (5 mg total) by mouth 2 (two) times daily.   aspirin EC 81 MG tablet Take 81 mg by mouth daily.   atorvastatin 10 MG tablet Commonly known as: LIPITOR Take 10 mg by mouth daily.   diltiazem 360 MG 24 hr capsule Commonly known as: CARDIZEM CD Take 1 capsule (360 mg  total) by mouth daily. Start taking on: June 01, 2023   ECHINACEA PO Take 1 tablet by mouth daily.   HYDROcodone-acetaminophen 5-325 MG tablet Commonly known as: NORCO/VICODIN Take 1-2 tablets by mouth every 4 (four) hours as needed. What changed: reasons to take this   OVER THE COUNTER MEDICATION Take 1 tablet by mouth 2 (two) times daily. Antioxidant multi vitamin        Follow-up Information     Alver Sorrow, NP. Go on 06/18/2023.   Specialty: Cardiology Why: Please come to cardiology follow up with Gillian Shields, NP on 06/18/23 at 2:20 PM at our [facility] office (on the second floor). Contact information: [address] [place] [address] [phone]         Lorenda Ishihara, MD Follow up.   Specialty: Internal Medicine Contact information: [address] [place] [address] [phone]                No Known Allergies  Consultations:cardiology   Procedures/Studies: ECHOCARDIOGRAM COMPLETE  Result Date: 05/30/2023    ECHOCARDIOGRAM REPORT   Patient Name:   Drew Calderon Date of Exam: 05/30/2023 Medical Rec #:  [id number]          Height:       72.0 in Accession #:    [id number]         Weight:       220.0 lb Date of Birth:  03/20/1941          BSA:          2.219 m Patient Age:    82 years           BP:           102/71 mmHg Patient Gender: M                  HR:           66 bpm. Exam Location:  Inpatient Procedure: 2D Echo, Cardiac Doppler and Color Doppler Indications:    Atrial Fibrillation I48.91  History:        Patient has no prior history of Echocardiogram examinations.                 Arrythmias:Atrial Fibrillation; Risk Factors:Hypertension and                 Dyslipidemia.  Sonographer:    Lucendia Herrlich RCS Referring Phys: 6 DAYNA N DUNN IMPRESSIONS  1. Wall motion assessment challenging with afib. Left ventricular ejection fraction, by estimation, is 50 to 55%. The left ventricle has low normal  function. Left ventricular diastolic parameters are indeterminate.  2. Right ventricular systolic function is normal. The right ventricular size is normal. There is mildly elevated pulmonary artery systolic pressure. The estimated right ventricular systolic pressure is 38.8 mmHg.  3. Left atrial size was mildly dilated.  4. Right atrial size was mildly dilated.  5. The mitral valve is degenerative. Mild mitral valve regurgitation.  6. The aortic valve is grossly normal. Aortic valve regurgitation is not visualized.  7. The inferior vena cava is dilated in size with <50% respiratory variability, suggesting right atrial pressure of 15 mmHg. FINDINGS  Left Ventricle: Wall motion assessment challenging with afib. Left ventricular ejection fraction, by estimation, is 50 to 55%. The left ventricle has low normal function. The left ventricular internal cavity size was normal in size. There is no left ventricular hypertrophy. Left ventricular diastolic parameters are indeterminate. Right Ventricle: The right ventricular size is normal. Right ventricular systolic function is normal. There is mildly elevated pulmonary artery systolic pressure. The tricuspid regurgitant velocity is 2.44 m/s, and with an assumed right atrial pressure of 15 mmHg, the estimated right ventricular systolic pressure is 38.8 mmHg. Left Atrium: Left atrial size was mildly dilated. Right Atrium: Right atrial size was mildly dilated. Pericardium: There is no evidence of pericardial effusion. Mitral Valve: The mitral valve is degenerative in appearance. Mild mitral valve regurgitation. Tricuspid Valve: Tricuspid valve regurgitation is mild. Aortic Valve: The aortic valve is grossly normal. Aortic valve regurgitation is not visualized. Aortic valve peak gradient measures 9.1 mmHg. Pulmonic Valve: The pulmonic valve was not well visualized. Pulmonic valve regurgitation is not visualized. Aorta: The aortic root and ascending aorta are structurally normal,  with no evidence of dilitation. Venous: The inferior vena cava is dilated in size with less than 50% respiratory variability, suggesting right atrial pressure of 15 mmHg. IAS/Shunts: No atrial level shunt detected by color flow Doppler.  LEFT VENTRICLE PLAX 2D LVIDd:         5.00 cm   Diastology LVIDs:         3.70 cm   LV e' medial:    12.30 cm/s LV PW:         0.80 cm   LV E/e' medial:  10.7 LV IVS:        0.80 cm   LV e' lateral:   12.40 cm/s LVOT diam:     2.00 cm   LV E/e' lateral: 10.6 LV SV:         53 LV SV Index:   24 LVOT Area:     3.14 cm  RIGHT VENTRICLE             IVC RV S prime:     16.60 cm/s  IVC diam: 2.90 cm TAPSE (M-mode): 1.2 cm LEFT ATRIUM             Index        RIGHT ATRIUM           Index LA diam:        3.80 cm 1.71 cm/m   RA Area:     24.10 cm LA Vol (A2C):   77.1 ml 34.75 ml/m  RA Volume:   74.70 ml  33.67 ml/m LA Vol (A4C):   66.9 ml 30.15 ml/m LA Biplane Vol: 70.4 ml 31.73 ml/m  AORTIC VALVE AV Area (Vmax): 2.19 cm AV Vmax:        151.00 cm/s AV Peak Grad:   9.1 mmHg LVOT Vmax:      105.33 cm/s LVOT Vmean:     67.733 cm/s LVOT VTI:       0.168 m  AORTA Ao Root diam: 3.40 cm Ao Asc diam:  3.90 cm MITRAL VALVE                TRICUSPID VALVE MV Area (PHT): 4.80 cm     TR Peak  grad:   23.8 mmHg MV Decel Time: 158 msec     TR Vmax:        244.00 cm/s MR Peak grad: 68.9 mmHg MR Vmax:      415.00 cm/s   SHUNTS MV E velocity: 132.00 cm/s  Systemic VTI:  0.17 m                             Systemic Diam: 2.00 cm Carolan Clines Electronically signed by Carolan Clines Signature Date/Time: 05/30/2023/10:16:17 AM    Final    CT Angio Chest Pulmonary Embolism (PE) W or WO Contrast  Result Date: 05/29/2023 CLINICAL DATA:  Pulmonary embolism (PE) suspected, low to intermediate prob, neg D-dimer. Possible sepsis. EXAM: CT ANGIOGRAPHY CHEST WITH CONTRAST TECHNIQUE: Multidetector CT imaging of the chest was performed using the standard protocol during bolus administration of intravenous contrast.  Multiplanar CT image reconstructions and MIPs were obtained to evaluate the vascular anatomy. RADIATION DOSE REDUCTION: This exam was performed according to the departmental dose-optimization program which includes automated exposure control, adjustment of the mA and/or kV according to patient size and/or use of iterative reconstruction technique. CONTRAST:  75mL OMNIPAQUE IOHEXOL 350 MG/ML SOLN COMPARISON:  Chest radiograph 05/28/2023. FINDINGS: Cardiovascular: Satisfactory opacification of the pulmonary arteries to the segmental level. No evidence of pulmonary embolism. Mild cardiomegaly. No pericardial effusion. Extensive atherosclerotic calcifications of the coronary arteries. Aortic atherosclerosis. Mediastinum/Nodes: No suspicious thoracic lymphadenopathy. Trachea and esophagus are unremarkable. Lungs/Pleura: Patchy consolidation in dependent portions of the lungs, greatest in the left lower lobe, suspicious for aspiration versus multifocal infection. Trace left pleural effusion. No pneumothorax. Upper Abdomen: No acute findings. Musculoskeletal: No chest wall abnormality. No acute or significant osseous findings. Review of the MIP images confirms the above findings. IMPRESSION: 1. No evidence of pulmonary embolism. 2. Patchy consolidation in dependent portions of the lungs, greatest in the left lower lobe, suspicious for aspiration versus multifocal infection. 3. Trace left pleural effusion. Aortic Atherosclerosis (ICD10-I70.0). Electronically Signed   By: Orvan Falconer M.D.   On: 05/29/2023 13:45   DG Chest Portable 1 View  Result Date: 05/29/2023 CLINICAL DATA:  Suspected sepsis EXAM: PORTABLE CHEST 1 VIEW COMPARISON:  Chest x-ray 02/11/2023 FINDINGS: The heart is mildly enlarged. There central pulmonary vascular congestion. There are minimal patchy retrocardiac opacities. Costophrenic angles are clear. There is no pneumothorax. IMPRESSION: 1. Mild cardiomegaly with central pulmonary vascular  congestion. 2. Minimal patchy retrocardiac opacities, atelectasis versus infiltrate. Electronically Signed   By: Darliss Cheney M.D.   On: 05/29/2023 01:44   (Echo, Carotid, EGD, Colonoscopy, ERCP)    Subjective:  Anxious to go home Feels well Discharge Exam: Vitals:   05/30/23 2125 05/31/23 0439  BP: 126/75 108/64  Pulse: 82 80  Resp: 18 18  Temp: 98.1 F (36.7 C) 98 F (36.7 C)  SpO2: 93% 93%   Vitals:   05/30/23 1050 05/30/23 1357 05/30/23 2125 05/31/23 0439  BP:  125/82 126/75 108/64  Pulse:  88 82 80  Resp:  20 18 18   Temp:  98.1 F (36.7 C) 98.1 F (36.7 C) 98 F (36.7 C)  TempSrc:  Oral Oral Oral  SpO2: 94% 93% 93% 93%  Weight:      Height:        General: Pt is alert, awake, not in acute distress Cardiovascular: RRR, S1/S2 +, no rubs, no gallops Respiratory: few rhonchi  bilaterally, no wheezing, no rhonchi Abdominal: Soft, NT, ND,  bowel sounds + Extremities: no edema, no cyanosis    The results of significant diagnostics from this hospitalization (including imaging, microbiology, ancillary and laboratory) are listed below for reference.     Microbiology: Recent Results (from the past 240 hour(s))  Culture, blood (Routine x 2)     Status: None (Preliminary result)   Collection Time: 05/28/23 11:17 PM   Specimen: BLOOD  Result Value Ref Range Status   Specimen Description   Final    BLOOD RIGHT ANTECUBITAL Performed at [facility], [address] [place], [place] [address]    Special Requests   Final    BOTTLES DRAWN AEROBIC AND ANAEROBIC Blood Culture results may not be optimal due to an excessive volume of blood received in culture bottles Performed at [facility], [address] [place], [place] [address]    Culture   Final    NO GROWTH 2 DAYS Performed at [facility], [address], [place], [place] [address]    Report Status PENDING  Incomplete  Resp panel by RT-PCR (RSV, Flu A&B, Covid) Anterior Nasal Swab      Status: None   Collection Time: 05/28/23 11:17 PM   Specimen: Anterior Nasal Swab  Result Value Ref Range Status   SARS Coronavirus 2 by RT PCR NEGATIVE NEGATIVE Final    Comment: (NOTE) SARS-CoV-2 target nucleic acids are NOT DETECTED.  The SARS-CoV-2 RNA is generally detectable in upper respiratory specimens during the acute phase of infection. The lowest concentration of SARS-CoV-2 viral copies this assay can detect is 138 copies/mL. A negative result does not preclude SARS-Cov-2 infection and should not be used as the sole basis for treatment or other patient management decisions. A negative result may occur with  improper specimen collection/handling, submission of specimen other than nasopharyngeal swab, presence of viral mutation(s) within the areas targeted by this assay, and inadequate number of viral copies(<138 copies/mL). A negative result must be combined with clinical observations, patient history, and epidemiological information. The expected result is Negative.  Fact Sheet for Patients:  BloggerCourse.com  Fact Sheet for Healthcare Providers:  SeriousBroker.it  This test is no t yet approved or cleared by the [place] FDA and  has been authorized for detection and/or diagnosis of SARS-CoV-2 by FDA under an Emergency Use Authorization (EUA). This EUA will remain  in effect (meaning this test can be used) for the duration of the COVID-19 declaration under Section 564(b)(1) of the Act, 21 U.S.C.section 360bbb-3(b)(1), unless the authorization is terminated  or revoked sooner.       Influenza A by PCR NEGATIVE NEGATIVE Final   Influenza B by PCR NEGATIVE NEGATIVE Final    Comment: (NOTE) The Xpert Xpress SARS-CoV-2/FLU/RSV plus assay is intended as an aid in the diagnosis of influenza from Nasopharyngeal swab specimens and should not be used as a sole basis for treatment. Nasal washings and aspirates are  unacceptable for Xpert Xpress SARS-CoV-2/FLU/RSV testing.  Fact Sheet for Patients: BloggerCourse.com  Fact Sheet for Healthcare Providers: SeriousBroker.it  This test is not yet approved or cleared by the [place] FDA and has been authorized for detection and/or diagnosis of SARS-CoV-2 by FDA under an Emergency Use Authorization (EUA). This EUA will remain in effect (meaning this test can be used) for the duration of the COVID-19 declaration under Section 564(b)(1) of the Act, 21 U.S.C. section 360bbb-3(b)(1), unless the authorization is terminated or revoked.     Resp Syncytial Virus by PCR  NEGATIVE NEGATIVE Final    Comment: (NOTE) Fact Sheet for Patients: BloggerCourse.com  Fact Sheet for Healthcare Providers: SeriousBroker.it  This test is not yet approved or cleared by the [place] FDA and has been authorized for detection and/or diagnosis of SARS-CoV-2 by FDA under an Emergency Use Authorization (EUA). This EUA will remain in effect (meaning this test can be used) for the duration of the COVID-19 declaration under Section 564(b)(1) of the Act, 21 U.S.C. section 360bbb-3(b)(1), unless the authorization is terminated or revoked.  Performed at [facility], [address] [place], [place] [address]   Culture, blood (Routine x 2)     Status: None (Preliminary result)   Collection Time: 05/28/23 11:18 PM   Specimen: BLOOD  Result Value Ref Range Status   Specimen Description   Final    BLOOD LEFT ANTECUBITAL Performed at [facility], [address] [place], [place] [address]    Special Requests   Final    BOTTLES DRAWN AEROBIC AND ANAEROBIC Blood Culture results may not be optimal due to an excessive volume of blood received in culture bottles Performed at [facility], [address] [place], [place] [address]    Culture    Final    NO GROWTH 2 DAYS Performed at [facility], [address], [place], [place] [address]    Report Status PENDING  Incomplete  Respiratory (~20 pathogens) panel by PCR     Status: None   Collection Time: 05/29/23  9:19 AM   Specimen: Nasopharyngeal Swab; Respiratory  Result Value Ref Range Status   Adenovirus NOT DETECTED NOT DETECTED Final   Coronavirus 229E NOT DETECTED NOT DETECTED Final    Comment: (NOTE) The Coronavirus on the Respiratory Panel, DOES NOT test for the novel  Coronavirus (2019 nCoV)    Coronavirus HKU1 NOT DETECTED NOT DETECTED Final   Coronavirus NL63 NOT DETECTED NOT DETECTED Final   Coronavirus OC43 NOT DETECTED NOT DETECTED Final   Metapneumovirus NOT DETECTED NOT DETECTED Final   Rhinovirus / Enterovirus NOT DETECTED NOT DETECTED Final   Influenza A NOT DETECTED NOT DETECTED Final   Influenza B NOT DETECTED NOT DETECTED Final   Parainfluenza Virus 1 NOT DETECTED NOT DETECTED Final   Parainfluenza Virus 2 NOT DETECTED NOT DETECTED Final   Parainfluenza Virus 3 NOT DETECTED NOT DETECTED Final   Parainfluenza Virus 4 NOT DETECTED NOT DETECTED Final   Respiratory Syncytial Virus NOT DETECTED NOT DETECTED Final   Bordetella pertussis NOT DETECTED NOT DETECTED Final   Bordetella Parapertussis NOT DETECTED NOT DETECTED Final   Chlamydophila pneumoniae NOT DETECTED NOT DETECTED Final   Mycoplasma pneumoniae NOT DETECTED NOT DETECTED Final    Comment: Performed at [facility], [address], [place], [place] [address]     Labs: BNP (last 3 results) Recent Labs    05/28/23 2317  BNP 219.3*   Basic Metabolic Panel: Recent Labs  Lab 05/28/23 2317 05/30/23 0505 05/31/23 0505  NA 136 132* 137  K 3.6 3.7 3.3*  CL 103 102 102  CO2 21* 22 23  GLUCOSE 116* 121* 129*  BUN 14 18 20   CREATININE 0.88 0.87 0.79  CALCIUM 9.0 9.2 9.3   Liver Function Tests: Recent Labs  Lab 05/28/23 2317 05/30/23 0505 05/31/23 0505  AST 46* 26 28   ALT 51* 35 37  ALKPHOS 107 78 84  BILITOT 1.4* 2.1* 1.4*  PROT 7.7 6.6 6.7  ALBUMIN 4.1 3.3* 3.2*   No results for input(s): "LIPASE", "AMYLASE" in the last 168 hours. No results for input(s): "AMMONIA" in the last 168 hours. CBC: Recent Labs  Lab 05/28/23 2317 05/29/23 0449 05/30/23 0505 05/31/23 0505  WBC 9.8 10.9* 6.5 5.3  NEUTROABS 8.5*  --   --   --   HGB 14.7 14.0 13.3 12.7*  HCT 41.3 39.1 38.7* 37.7*  MCV 98.6 98.7 102.4* 102.7*  PLT 123* 113* 104* 134*   Cardiac Enzymes: No results for input(s): "CKTOTAL", "CKMB", "CKMBINDEX", "TROPONINI" in the last 168 hours. BNP: Invalid input(s): "POCBNP" CBG: No results for input(s): "GLUCAP" in the last 168 hours. D-Dimer No results for input(s): "DDIMER" in the last 72 hours. Hgb A1c Recent Labs    05/30/23 0505  HGBA1C 5.3   Lipid Profile Recent Labs    05/30/23 0505  CHOL 101  HDL 50  LDLCALC 42  TRIG 43  CHOLHDL 2.0   Thyroid function studies Recent Labs    05/29/23 1229  TSH 2.459   Anemia work up No results for input(s): "VITAMINB12", "FOLATE", "FERRITIN", "TIBC", "IRON", "RETICCTPCT" in the last 72 hours. Urinalysis    Component Value Date/Time   COLORURINE YELLOW 06/26/2015 1905   APPEARANCEUR HAZY (A) 06/26/2015 1905   LABSPEC 1.011 06/26/2015 1905   PHURINE 5.5 06/26/2015 1905   GLUCOSEU NEGATIVE 06/26/2015 1905   HGBUR TRACE (A) 06/26/2015 1905   BILIRUBINUR NEGATIVE 06/26/2015 1905   KETONESUR NEGATIVE 06/26/2015 1905   PROTEINUR NEGATIVE 06/26/2015 1905   NITRITE NEGATIVE 06/26/2015 1905   LEUKOCYTESUR LARGE (A) 06/26/2015 1905   Sepsis Labs Recent Labs  Lab 05/28/23 2317 05/29/23 0449 05/30/23 0505 05/31/23 0505  WBC 9.8 10.9* 6.5 5.3   Microbiology Recent Results (from the past 240 hour(s))  Culture, blood (Routine x 2)     Status: None (Preliminary result)   Collection Time: 05/28/23 11:17 PM   Specimen: BLOOD  Result Value Ref Range Status   Specimen Description    Final    BLOOD RIGHT ANTECUBITAL Performed at [facility], [address] [place], [place] [address]    Special Requests   Final    BOTTLES DRAWN AEROBIC AND ANAEROBIC Blood Culture results may not be optimal due to an excessive volume of blood received in culture bottles Performed at [facility], [address] [place], [place] [address]    Culture   Final    NO GROWTH 2 DAYS Performed at [facility], [address] [place], [place] [address]    Report Status PENDING  Incomplete  Resp panel by RT-PCR (RSV, Flu A&B, Covid) Anterior Nasal Swab     Status: None   Collection Time: 05/28/23 11:17 PM   Specimen: Anterior Nasal Swab  Result Value Ref Range Status   SARS Coronavirus 2 by RT PCR NEGATIVE NEGATIVE Final    Comment: (NOTE) SARS-CoV-2 target nucleic acids are NOT DETECTED.  The SARS-CoV-2 RNA is generally detectable in upper respiratory specimens during the acute phase of infection. The lowest concentration of SARS-CoV-2 viral copies this assay can detect is 138 copies/mL. A negative result does not preclude SARS-Cov-2 infection and should not be used as the sole basis for treatment or other patient management decisions. A negative result may occur with  improper specimen collection/handling, submission of specimen other than nasopharyngeal swab, presence of viral mutation(s) within the areas targeted by this assay, and inadequate number of viral copies(<138 copies/mL). A negative  result must be combined with clinical observations, patient history, and epidemiological information. The expected result is Negative.  Fact Sheet for Patients:  BloggerCourse.com  Fact Sheet for Healthcare Providers:  SeriousBroker.it  This test is no t yet approved or cleared by the [place] FDA and  has been authorized for detection and/or diagnosis of SARS-CoV-2 by FDA under an Emergency Use  Authorization (EUA). This EUA will remain  in effect (meaning this test can be used) for the duration of the COVID-19 declaration under Section 564(b)(1) of the Act, 21 U.S.C.section 360bbb-3(b)(1), unless the authorization is terminated  or revoked sooner.       Influenza A by PCR NEGATIVE NEGATIVE Final   Influenza B by PCR NEGATIVE NEGATIVE Final    Comment: (NOTE) The Xpert Xpress SARS-CoV-2/FLU/RSV plus assay is intended as an aid in the diagnosis of influenza from Nasopharyngeal swab specimens and should not be used as a sole basis for treatment. Nasal washings and aspirates are unacceptable for Xpert Xpress SARS-CoV-2/FLU/RSV testing.  Fact Sheet for Patients: BloggerCourse.com  Fact Sheet for Healthcare Providers: SeriousBroker.it  This test is not yet approved or cleared by the [place] FDA and has been authorized for detection and/or diagnosis of SARS-CoV-2 by FDA under an Emergency Use Authorization (EUA). This EUA will remain in effect (meaning this test can be used) for the duration of the COVID-19 declaration under Section 564(b)(1) of the Act, 21 U.S.C. section 360bbb-3(b)(1), unless the authorization is terminated or revoked.     Resp Syncytial Virus by PCR NEGATIVE NEGATIVE Final    Comment: (NOTE) Fact Sheet for Patients: BloggerCourse.com  Fact Sheet for Healthcare Providers: SeriousBroker.it  This test is not yet approved or cleared by the [place] FDA and has been authorized for detection and/or diagnosis of SARS-CoV-2 by FDA under an Emergency Use Authorization (EUA). This EUA will remain in effect (meaning this test can be used) for the duration of the COVID-19 declaration under Section 564(b)(1) of the Act, 21 U.S.C. section 360bbb-3(b)(1), unless the authorization is terminated or revoked.  Performed at [facility], [address] [place], [place] [address]   Culture, blood (Routine x 2)     Status: None (Preliminary result)   Collection Time: 05/28/23 11:18 PM   Specimen: BLOOD  Result Value Ref Range Status   Specimen Description   Final    BLOOD LEFT ANTECUBITAL Performed at [facility], [address] [place], [place] [address]    Special Requests   Final    BOTTLES DRAWN AEROBIC AND ANAEROBIC Blood Culture results may not be optimal due to an excessive volume of blood received in culture bottles Performed at [facility], [address] [place], [place] [address]    Culture   Final    NO GROWTH 2 DAYS Performed at [facility], [address] [place], [place] [address]    Report Status PENDING  Incomplete  Respiratory (~20 pathogens) panel by PCR     Status: None   Collection Time: 05/29/23  9:19 AM   Specimen: Nasopharyngeal Swab; Respiratory  Result Value Ref Range Status   Adenovirus NOT DETECTED NOT DETECTED Final   Coronavirus 229E NOT DETECTED NOT DETECTED Final    Comment: (NOTE) The Coronavirus on the Respiratory Panel, DOES NOT test for the novel  Coronavirus (2019 nCoV)    Coronavirus HKU1 NOT DETECTED NOT DETECTED Final   Coronavirus NL63 NOT DETECTED NOT DETECTED Final  Coronavirus OC43 NOT DETECTED NOT DETECTED Final   Metapneumovirus NOT DETECTED NOT DETECTED Final   Rhinovirus / Enterovirus NOT DETECTED NOT DETECTED Final   Influenza A NOT DETECTED NOT DETECTED Final   Influenza B NOT DETECTED NOT DETECTED Final   Parainfluenza Virus 1 NOT DETECTED NOT DETECTED Final   Parainfluenza Virus 2 NOT DETECTED NOT DETECTED Final   Parainfluenza Virus 3 NOT DETECTED NOT DETECTED Final   Parainfluenza Virus 4 NOT DETECTED NOT DETECTED Final   Respiratory Syncytial Virus NOT DETECTED NOT DETECTED Final   Bordetella pertussis NOT DETECTED NOT DETECTED Final   Bordetella Parapertussis NOT DETECTED NOT DETECTED Final   Chlamydophila pneumoniae NOT  DETECTED NOT DETECTED Final   Mycoplasma pneumoniae NOT DETECTED NOT DETECTED Final    Comment: Performed at [facility], [address], [place], [place] [address]     Time coordinating discharge: 39 minutes  SIGNED:   Alwyn Ren, MD  [facility] 05/31/2023, 2:43 PM

## 2023-06-03 LAB — CULTURE, BLOOD (ROUTINE X 2)
Culture: NO GROWTH
Culture: NO GROWTH

## 2023-06-06 ENCOUNTER — Encounter (HOSPITAL_BASED_OUTPATIENT_CLINIC_OR_DEPARTMENT_OTHER): Payer: Self-pay | Admitting: Cardiology

## 2023-06-06 ENCOUNTER — Ambulatory Visit (INDEPENDENT_AMBULATORY_CARE_PROVIDER_SITE_OTHER): Payer: Medicare Other | Admitting: Cardiology

## 2023-06-06 VITALS — BP 106/70 | HR 73 | Ht 72.0 in | Wt 207.8 lb

## 2023-06-06 DIAGNOSIS — I10 Essential (primary) hypertension: Secondary | ICD-10-CM | POA: Diagnosis not present

## 2023-06-06 DIAGNOSIS — Z09 Encounter for follow-up examination after completed treatment for conditions other than malignant neoplasm: Secondary | ICD-10-CM

## 2023-06-06 DIAGNOSIS — I4819 Other persistent atrial fibrillation: Secondary | ICD-10-CM | POA: Diagnosis not present

## 2023-06-06 DIAGNOSIS — Z7189 Other specified counseling: Secondary | ICD-10-CM

## 2023-06-06 DIAGNOSIS — I4891 Unspecified atrial fibrillation: Secondary | ICD-10-CM

## 2023-06-06 DIAGNOSIS — E785 Hyperlipidemia, unspecified: Secondary | ICD-10-CM

## 2023-06-06 NOTE — Progress Notes (Signed)
Cardiology Office Note:  .   Date:  06/06/2023  ID:  Drew Calderon, DOB [birth year], MRN [id number] PCP: Lorenda Ishihara, MD  [facility] Providers Cardiologist:  Jodelle Red, MD {  History of Present Illness: .   Drew Calderon is a 82 y.o. male with PMH atrial fibrillation, hypertension, hyperlipidemia, hemachromatosis. I met him during his hospitalization 04/2023 for atrial fibrillation.  Pertinent CV history: Noted symptoms of LE edema and DOE beginning after Covid infection 01/2023. Presented for evaluation 05/29/23, found to be hypoxic and in new atrial fibrillation. CT showed pneumonia.  Today: Feeling much better. Breathing improved, cough is improved,no wheezing. No fevers/chills. Able to walk several miles yesterday without limitations.  He is still in atrial fibrillation today. We discussed medical management vs. Cardioversion.  ROS: Denies chest pain, shortness of breath at rest or with normal exertion. No PND, orthopnea, LE edema or unexpected weight gain. No syncope or palpitations. ROS otherwise negative except as noted.   Studies Reviewed: Marland Kitchen    EKG:  EKG Interpretation Date/Time:  Thursday June 06 2023 11:44:46 EST Ventricular Rate:  72 PR Interval:    QRS Duration:  84 QT Interval:  374 QTC Calculation: 409 R Axis:   31  Text Interpretation: Atrial fibrillation When compared with ECG of 28-May-2023 23:00, PREVIOUS ECG IS PRESENT Confirmed by Jodelle Red [phone]) on 06/06/2023 12:02:34 PM    Physical Exam:   VS:  BP 106/70   Pulse 73   Ht 6' (1.829 m)   Wt 207 lb 12.8 oz (94.3 kg)   SpO2 95%   BMI 28.18 kg/m    Wt Readings from Last 3 Encounters:  06/06/23 207 lb 12.8 oz (94.3 kg)  05/28/23 220 lb 0.3 oz (99.8 kg)  04/23/23 220 lb (99.8 kg)    GEN: Well nourished, well developed in no acute distress HEENT: Normal, moist mucous membranes NECK: No JVD CARDIAC: irregularly irregular rhythm, normal S1 and S2, no  rubs or gallops. No murmur. VASCULAR: Radial and DP pulses 2+ bilaterally. No carotid bruits RESPIRATORY:  Clear to auscultation without rales, wheezing or rhonchi  ABDOMEN: Soft, non-tender, non-distended MUSCULOSKELETAL:  Ambulates independently SKIN: Warm and dry, no edema NEUROLOGIC:  Alert and oriented x 3. No focal neuro deficits noted. PSYCHIATRIC:  Normal affect    ASSESSMENT AND PLAN: .    New atrial fibrillation in the setting of pneumonia -EF ~55% -started on diltiazem 360 mg, apixaban 5 mg BID during his hospitalization -still in afib, discussed cardioversion today. If needed, he is amenable. We will plan to get an ECG in 2 weeks, and if still in afib, will arrange for cardioversion -tolerating apixaban, started 10/30 -chadsvasc=3  Informed Consent   Shared Decision Making/Informed Consent The risks (stroke, cardiac arrhythmias rarely resulting in the need for a temporary or permanent pacemaker, skin irritation or burns and complications associated with conscious sedation including aspiration, arrhythmia, respiratory failure and death), benefits (restoration of normal sinus rhythm) and alternatives of a direct current cardioversion were explained in detail to Drew Calderon and he agrees to proceed.        Hypertension -BP low in the hospital, lisinopril held, amlodipine stopped with start of diltiazem -controlled today, continue   Hyperlipidemia -continue atorvastatin  CV risk counseling and prevention -recommend heart healthy/Mediterranean diet, with whole grains, fruits, vegetable, fish, lean meats, nuts, and olive oil. Limit salt. -recommend moderate walking, 3-5 times/week for 30-50 minutes each session. Aim for at least 150 minutes.week. Goal should be  pace of 3 miles/hours, or walking 1.5 miles in 30 minutes -recommend avoidance of tobacco products. Avoid excess alcohol.  Dispo: keep appt on 1/29 with me. Nurse visit in 2 weeks for ECG  Signed, Jodelle Red, MD   Jodelle Red, MD, PhD, [facility]  [facility]  [place]  Heart & Vascular at [facility] at [facility] [address], [address] [place], [place] [address] [phone]

## 2023-06-06 NOTE — H&P (View-Only) (Signed)
Cardiology Office Note:  .   Date:  06/06/2023  ID:  Drew Calderon, DOB 04/03/1941, MRN [id number] PCP: Drew Ishihara, MD  [facility] Providers Cardiologist:  Drew Red, MD {  History of Present Illness: .   Drew Calderon is a 82 y.o. male with PMH atrial fibrillation, hypertension, hyperlipidemia, hemachromatosis. I met him during his hospitalization 04/2023 for atrial fibrillation.  Pertinent CV history: Noted symptoms of LE edema and DOE beginning after Covid infection 01/2023. Presented for evaluation 05/29/23, found to be hypoxic and in new atrial fibrillation. CT showed pneumonia.  Today: Feeling much better. Breathing improved, cough is improved,no wheezing. No fevers/chills. Able to walk several miles yesterday without limitations.  He is still in atrial fibrillation today. We discussed medical management vs. Cardioversion.  ROS: Denies chest pain, shortness of breath at rest or with normal exertion. No PND, orthopnea, LE edema or unexpected weight gain. No syncope or palpitations. ROS otherwise negative except as noted.   Studies Reviewed: Marland Kitchen    EKG:  EKG Interpretation Date/Time:  Thursday June 06 2023 11:44:46 EST Ventricular Rate:  72 PR Interval:    QRS Duration:  84 QT Interval:  374 QTC Calculation: 409 R Axis:   31  Text Interpretation: Atrial fibrillation When compared with ECG of 28-May-2023 23:00, PREVIOUS ECG IS PRESENT Confirmed by Drew Calderon [phone]) on 06/06/2023 12:02:34 PM    Physical Exam:   VS:  BP 106/70   Pulse 73   Ht 6' (1.829 m)   Wt 207 lb 12.8 oz (94.3 kg)   SpO2 95%   BMI 28.18 kg/m    Wt Readings from Last 3 Encounters:  06/06/23 207 lb 12.8 oz (94.3 kg)  05/28/23 220 lb 0.3 oz (99.8 kg)  04/23/23 220 lb (99.8 kg)    GEN: Well nourished, well developed in no acute distress HEENT: Normal, moist mucous membranes NECK: No JVD CARDIAC: irregularly irregular rhythm, normal S1 and S2, no  rubs or gallops. No murmur. VASCULAR: Radial and DP pulses 2+ bilaterally. No carotid bruits RESPIRATORY:  Clear to auscultation without rales, wheezing or rhonchi  ABDOMEN: Soft, non-tender, non-distended MUSCULOSKELETAL:  Ambulates independently SKIN: Warm and dry, no edema NEUROLOGIC:  Alert and oriented x 3. No focal neuro deficits noted. PSYCHIATRIC:  Normal affect    ASSESSMENT AND PLAN: .    New atrial fibrillation in the setting of pneumonia -EF ~55% -started on diltiazem 360 mg, apixaban 5 mg BID during his hospitalization -still in afib, discussed cardioversion today. If needed, he is amenable. We will plan to get an ECG in 2 weeks, and if still in afib, will arrange for cardioversion -tolerating apixaban, started 10/30 -chadsvasc=3  Informed Consent   Shared Decision Making/Informed Consent The risks (stroke, cardiac arrhythmias rarely resulting in the need for a temporary or permanent pacemaker, skin irritation or burns and complications associated with conscious sedation including aspiration, arrhythmia, respiratory failure and death), benefits (restoration of normal sinus rhythm) and alternatives of a direct current cardioversion were explained in detail to Drew Calderon and he agrees to proceed.        Hypertension -BP low in the hospital, lisinopril held, amlodipine stopped with start of diltiazem -controlled today, continue   Hyperlipidemia -continue atorvastatin  CV risk counseling and prevention -recommend heart healthy/Mediterranean diet, with whole grains, fruits, vegetable, fish, lean meats, nuts, and olive oil. Limit salt. -recommend moderate walking, 3-5 times/week for 30-50 minutes each session. Aim for at least 150 minutes.week. Goal should be  pace of 3 miles/hours, or walking 1.5 miles in 30 minutes -recommend avoidance of tobacco products. Avoid excess alcohol.  Dispo: keep appt on 1/29 with me. Nurse visit in 2 weeks for ECG  Signed, Drew Red, MD   Drew Red, MD, PhD, [facility]  [facility]  [place]  Heart & Vascular at [facility] at [facility] [address] [place], [place] [address] [phone]

## 2023-06-06 NOTE — Patient Instructions (Signed)
Medication Instructions:  Your physician recommends that you continue on your current medications as directed. Please refer to the Current Medication list given to you today.  Labwork: NONE  Testing/Procedures: NONE  Follow-Up: NURSE VISIT 11/20 AT 1:00 PM  IF YOU ARE STILL IN AFIB WILL ARRANGE CARDIOVERSION FOR YOU   KEEP APPOINTMENT WITH DR Cristal Deer IN [place] AS SCHEDULED   Any Other Special Instructions Will Be Listed Below (If Applicable).   Electrical Cardioversion Electrical cardioversion is the delivery of a jolt of electricity to restore a normal rhythm to the heart. A rhythm that is too fast or is not regular (arrhythmia) keeps the heart from pumping blood well. There is also another type of cardioversion called a chemical (pharmacologic) cardioversion. This is when your health care provider gives you one or more medicines to bring back your regular heart rhythm. Electrical cardioversion is done as a scheduled procedure for arrhythmiasthat are not life-threatening. Electrical cardioversion may also be done in an emergency for sudden life-threatening arrhythmias. Tell a health care provider about: Any allergies you have. All medicines you are taking, including vitamins, herbs, eye drops, creams, and over-the-counter medicines. Any problems you or family members have had with sedatives or anesthesia. Any bleeding problems you have. Any surgeries you have had, including a pacemaker, defibrillator, or other implanted device. Any medical conditions you have. Whether you are pregnant or may be pregnant. What are the risks? Your provider will talk with you about risks. These include: Allergic reactions to medicines. Irritation to the skin on your chest or back where the sticky pads (electrodes) or paddles were put during electrical cardioversion. A blood clot that breaks free and travels to other parts of your body, such as your brain. Return of a worse abnormal heart rhythm  that will need to be treated with medicines, a pacemaker, or an implantable cardioverter defibrillator (ICD). What happens before the procedure? Medicines Your provider may give you: Blood-thinning medicines (anticoagulants) so your blood does not clot as easily. If your provider gives you this medicine, you may need to take it for 4 weeks before the procedure. Medicines to help stabilize your heart rate and rhythm. Ask your provider about: Changing or stopping your regular medicines. These include any diabetes medicines or blood thinners you take. Taking medicines such as aspirin and ibuprofen. These medicines can thin your blood. Do not take them unless your provider tells you to. Taking over-the-counter medicines, vitamins, herbs, and supplements. General instructions Follow instructions from your provider about what you may eat and drink. Do not put any lotions, powders, or ointments on your chest and back for 24 hours before the procedure. They can cause problems with the electrodes or paddles used to deliver electricity to your heart. Do not wear jewelry as this can interfere with delivering electricity to your heart. If you will be going home right after the procedure, plan to have a responsible adult: Take you home from the hospital or clinic. You will not be allowed to drive. Care for you for the time you are told. Tests You may have an exam or testing. This may include: Blood labs. A transesophageal echocardiogram (TEE). What happens during the procedure?     An IV will be inserted into one of your veins. You will be given a sedative. This helps you relax. Electrodes or metal paddles will be placed on your chest. They may be placed in one of these ways: One placed on your right chest, the other on the left  ribs. One placed on your chest and the other on your back. An electrical shock will be delivered. The shock briefly stops (resets) your heart rhythm. Your provider will  check to see if your heart rhythm is now normal. Some people need only one shock. Some need more to restore a normal heart rhythm. The procedure may vary among providers and hospitals. What happens after the procedure? Your blood pressure, heart rate, breathing rate, and blood oxygen level will be monitored until you leave the hospital or clinic. Your heart rhythm will be watched to make sure it does not change. This information is not intended to replace advice given to you by your health care provider. Make sure you discuss any questions you have with your health care provider. Document Revised: 03/08/2022 Document Reviewed: 03/08/2022 Elsevier Patient Education  2024 [employer].

## 2023-06-07 DIAGNOSIS — I4891 Unspecified atrial fibrillation: Secondary | ICD-10-CM | POA: Diagnosis not present

## 2023-06-07 DIAGNOSIS — J189 Pneumonia, unspecified organism: Secondary | ICD-10-CM | POA: Diagnosis not present

## 2023-06-07 DIAGNOSIS — R350 Frequency of micturition: Secondary | ICD-10-CM | POA: Diagnosis not present

## 2023-06-07 DIAGNOSIS — D6869 Other thrombophilia: Secondary | ICD-10-CM | POA: Diagnosis not present

## 2023-06-07 DIAGNOSIS — M109 Gout, unspecified: Secondary | ICD-10-CM | POA: Diagnosis not present

## 2023-06-07 DIAGNOSIS — R739 Hyperglycemia, unspecified: Secondary | ICD-10-CM | POA: Diagnosis not present

## 2023-06-07 DIAGNOSIS — R634 Abnormal weight loss: Secondary | ICD-10-CM | POA: Diagnosis not present

## 2023-06-10 DIAGNOSIS — R350 Frequency of micturition: Secondary | ICD-10-CM | POA: Diagnosis not present

## 2023-06-18 ENCOUNTER — Ambulatory Visit (HOSPITAL_BASED_OUTPATIENT_CLINIC_OR_DEPARTMENT_OTHER): Payer: Medicare Other | Admitting: Family

## 2023-06-19 ENCOUNTER — Ambulatory Visit (HOSPITAL_BASED_OUTPATIENT_CLINIC_OR_DEPARTMENT_OTHER): Payer: Medicare Other

## 2023-06-19 DIAGNOSIS — I4819 Other persistent atrial fibrillation: Secondary | ICD-10-CM

## 2023-06-19 DIAGNOSIS — Z23 Encounter for immunization: Secondary | ICD-10-CM | POA: Diagnosis not present

## 2023-06-19 NOTE — Patient Instructions (Addendum)
Nurse Visit   Date of Encounter: 06/19/2023 ID: Drew Calderon, DOB [birth year], MRN [id number]  PCP:  Lorenda Ishihara, MD   [facility] Providers Cardiologist:  Jodelle Red, MD      Visit Details   VS:  There were no vitals taken for this visit. , BMI There is no height or weight on file to calculate BMI.  Wt Readings from Last 3 Encounters:  06/06/23 207 lb 12.8 oz (94.3 kg)  05/28/23 220 lb 0.3 oz (99.8 kg)  04/23/23 220 lb (99.8 kg)     Reason for visit: Follow up EKG (A-fib) Performed today: EKG and Provider consulted:Dr. Cristal Deer Changes (medications, testing, etc.) : Per Dr. Cristal Deer, patient is to get a cardioversion.  Length of Visit: 15 minutes    Medications Adjustments/Labs and Tests Ordered: Orders Placed This Encounter  Procedures   EKG 12-Lead       Dear Drew Calderon  You are scheduled for a Cardioversion on Tuesday, November 26 with Dr. Jacques Navy.  Please arrive at the [facility] (Main Entrance A) at [facility]: [address] [place], [place] [address] at 10:30 AM (This time is 1 hour(s) before your procedure to ensure your preparation).   Free valet parking service is available. You will check in at ADMITTING.   *Please Note: You will receive a call the day before your procedure to confirm the appointment time. That time may have changed from the original time based on the schedule for that day.*    DIET:  Nothing to eat or drink after midnight except a sip of water with medications (see medication instructions below)  MEDICATION INSTRUCTIONS: !!IF ANY NEW MEDICATIONS ARE STARTED AFTER TODAY, PLEASE NOTIFY YOUR PROVIDER AS SOON AS POSSIBLE!!  FYI: Medications such as Semaglutide (Ozempic, [place]), Tirzepatide (Mounjaro, Zepbound), Dulaglutide (Trulicity), etc ("GLP1 agonists") AND Canagliflozin (Invokana), Dapagliflozin (Farxiga), Empagliflozin (Jardiance), Ertugliflozin (Steglatro), Bexagliflozin  [employer]) or any combination with one of these drugs such as Invokamet (Canagliflozin/Metformin), Synjardy (Empagliflozin/Metformin), etc ("SGLT2 inhibitors") must be held around the time of a procedure. This is not a comprehensive list of all of these drugs. Please review all of your medications and talk to your provider if you take any one of these. If you are not sure, ask your provider.     Continue taking your anticoagulant (blood thinner): Apixaban (Eliquis).  You will need to continue this after your procedure until you are told by your provider that it is safe to stop.    LABS: CBC & BMP today   FYI:  For your safety, and to allow [place] to monitor your vital signs accurately during the surgery/procedure we request: If you have artificial nails, gel coating, SNS etc, please have those removed prior to your surgery/procedure. Not having the nail coverings /polish removed may result in cancellation or delay of your surgery/procedure.  Your support person will be asked to wait in the waiting room during your procedure.  It is OK to have someone drop you off and come back when you are ready to be discharged.  You cannot drive after the procedure and will need someone to drive you home.  Bring your insurance cards.  *Special Note: Every effort is made to have your procedure done on time. Occasionally there are emergencies that occur at the hospital that may cause delays. Please be patient if a delay does occur.    Signed, Gabriel Rung, LPN  21/30/8657 1:56 PM   Electrical Cardioversion Electrical cardioversion  is the delivery of a jolt of electricity to restore a normal rhythm to the heart. A rhythm that is too fast or is not regular (arrhythmia) keeps the heart from pumping blood well. There is also another type of cardioversion called a chemical (pharmacologic) cardioversion. This is when your health care provider gives you one or more medicines to bring back your regular heart  rhythm. Electrical cardioversion is done as a scheduled procedure for arrhythmiasthat are not life-threatening. Electrical cardioversion may also be done in an emergency for sudden life-threatening arrhythmias. Tell a health care provider about: Any allergies you have. All medicines you are taking, including vitamins, herbs, eye drops, creams, and over-the-counter medicines. Any problems you or family members have had with sedatives or anesthesia. Any bleeding problems you have. Any surgeries you have had, including a pacemaker, defibrillator, or other implanted device. Any medical conditions you have. Whether you are pregnant or may be pregnant. What are the risks? Your provider will talk with you about risks. These include: Allergic reactions to medicines. Irritation to the skin on your chest or back where the sticky pads (electrodes) or paddles were put during electrical cardioversion. A blood clot that breaks free and travels to other parts of your body, such as your brain. Return of a worse abnormal heart rhythm that will need to be treated with medicines, a pacemaker, or an implantable cardioverter defibrillator (ICD). What happens before the procedure? Medicines Your provider may give you: Blood-thinning medicines (anticoagulants) so your blood does not clot as easily. If your provider gives you this medicine, you may need to take it for 4 weeks before the procedure. Medicines to help stabilize your heart rate and rhythm. Ask your provider about: Changing or stopping your regular medicines. These include any diabetes medicines or blood thinners you take. Taking medicines such as aspirin and ibuprofen. These medicines can thin your blood. Do not take them unless your provider tells you to. Taking over-the-counter medicines, vitamins, herbs, and supplements. General instructions Follow instructions from your provider about what you may eat and drink. Do not put any lotions, powders,  or ointments on your chest and back for 24 hours before the procedure. They can cause problems with the electrodes or paddles used to deliver electricity to your heart. Do not wear jewelry as this can interfere with delivering electricity to your heart. If you will be going home right after the procedure, plan to have a responsible adult: Take you home from the hospital or clinic. You will not be allowed to drive. Care for you for the time you are told. Tests You may have an exam or testing. This may include: Blood labs. A transesophageal echocardiogram (TEE). What happens during the procedure?     An IV will be inserted into one of your veins. You will be given a sedative. This helps you relax. Electrodes or metal paddles will be placed on your chest. They may be placed in one of these ways: One placed on your right chest, the other on the left ribs. One placed on your chest and the other on your back. An electrical shock will be delivered. The shock briefly stops (resets) your heart rhythm. Your provider will check to see if your heart rhythm is now normal. Some people need only one shock. Some need more to restore a normal heart rhythm. The procedure may vary among providers and hospitals. What happens after the procedure? Your blood pressure, heart rate, breathing rate, and blood oxygen level will be  monitored until you leave the hospital or clinic. Your heart rhythm will be watched to make sure it does not change. This information is not intended to replace advice given to you by your health care provider. Make sure you discuss any questions you have with your health care provider. Document Revised: 03/08/2022 Document Reviewed: 03/08/2022 Elsevier Patient Education  2024 [employer].

## 2023-06-20 LAB — BASIC METABOLIC PANEL
BUN/Creatinine Ratio: 17 (ref 10–24)
BUN: 17 mg/dL (ref 8–27)
CO2: 24 mmol/L (ref 20–29)
Calcium: 10.2 mg/dL (ref 8.6–10.2)
Chloride: 103 mmol/L (ref 96–106)
Creatinine, Ser: 1.02 mg/dL (ref 0.76–1.27)
Glucose: 97 mg/dL (ref 70–99)
Potassium: 4.8 mmol/L (ref 3.5–5.2)
Sodium: 139 mmol/L (ref 134–144)
eGFR: 73 mL/min/{1.73_m2} (ref >59)

## 2023-06-20 LAB — CBC
Hematocrit: 45.4 % (ref 37.5–51.0)
Hemoglobin: 15.6 g/dL (ref 13.0–17.7)
MCH: 35 pg — ABNORMAL HIGH (ref 26.6–33.0)
MCHC: 34.4 g/dL (ref 31.5–35.7)
MCV: 102 fL — ABNORMAL HIGH (ref 79–97)
Platelets: 206 10*3/uL (ref 150–450)
RBC: 4.46 x10E6/uL (ref 4.14–5.80)
RDW: 12.2 % (ref 11.6–15.4)
WBC: 7.2 10*3/uL (ref 3.4–10.8)

## 2023-06-24 NOTE — Progress Notes (Signed)
Spoke to pt and instructed them to come at 0915 and to be NPO after 0000.  Confirmed no missed doses of AC and instructed to take in AM with a small sip of water.  Confirmed that pt will have a ride home and someone to stay with them for 24 hours after the procedure. Instructed patient to not wear any jewelry or lotion.

## 2023-06-25 ENCOUNTER — Ambulatory Visit (HOSPITAL_COMMUNITY)
Admission: RE | Admit: 2023-06-25 | Discharge: 2023-06-25 | Disposition: A | Discharge status: Home / Self Care | Payer: Medicare Other | Source: Ambulatory Visit | Attending: Internal Medicine | Admitting: Internal Medicine

## 2023-06-25 ENCOUNTER — Ambulatory Visit (HOSPITAL_COMMUNITY): Payer: Medicare Other | Admitting: Anesthesiology

## 2023-06-25 ENCOUNTER — Other Ambulatory Visit: Payer: Self-pay

## 2023-06-25 ENCOUNTER — Encounter (HOSPITAL_COMMUNITY): Payer: Self-pay | Admitting: Internal Medicine

## 2023-06-25 ENCOUNTER — Encounter (HOSPITAL_COMMUNITY)
Admission: RE | Disposition: A | Discharge status: Home / Self Care | Payer: Self-pay | Source: Ambulatory Visit | Attending: Internal Medicine

## 2023-06-25 DIAGNOSIS — Z79899 Other long term (current) drug therapy: Secondary | ICD-10-CM | POA: Insufficient documentation

## 2023-06-25 DIAGNOSIS — Z7901 Long term (current) use of anticoagulants: Secondary | ICD-10-CM | POA: Diagnosis not present

## 2023-06-25 DIAGNOSIS — I10 Essential (primary) hypertension: Secondary | ICD-10-CM | POA: Insufficient documentation

## 2023-06-25 DIAGNOSIS — I4891 Unspecified atrial fibrillation: Secondary | ICD-10-CM | POA: Diagnosis not present

## 2023-06-25 DIAGNOSIS — E785 Hyperlipidemia, unspecified: Secondary | ICD-10-CM | POA: Insufficient documentation

## 2023-06-25 HISTORY — PX: CARDIOVERSION: EP1203

## 2023-06-25 SURGERY — CARDIOVERSION (CATH LAB)
Anesthesia: Monitor Anesthesia Care

## 2023-06-25 MED ORDER — PROPOFOL 10 MG/ML IV BOLUS
INTRAVENOUS | Status: DC | PRN
  Administered 2023-06-25: 50 mg via INTRAVENOUS

## 2023-06-25 MED ORDER — LIDOCAINE 2% (20 MG/ML) 5 ML SYRINGE
INTRAMUSCULAR | Status: DC | PRN
  Administered 2023-06-25: 60 mg via INTRAVENOUS

## 2023-06-25 MED ORDER — CHLORHEXIDINE GLUCONATE 0.12 % MT SOLN
OROMUCOSAL | Status: AC
  Filled 2023-06-25: qty 15

## 2023-06-25 MED ORDER — SODIUM CHLORIDE 0.9% FLUSH
10.0000 mL | Freq: Two times a day (BID) | INTRAVENOUS | Status: DC
  Administered 2023-06-25: 10 mL via INTRAVENOUS

## 2023-06-25 SURGICAL SUPPLY — 1 items: PAD DEFIB RADIO PHYSIO CONN (PAD) ×2 IMPLANT

## 2023-06-25 NOTE — Anesthesia Preprocedure Evaluation (Signed)
Anesthesia Evaluation  Patient identified by MRN, date of birth, ID band Patient awake    Reviewed: Allergy & Precautions, NPO status , Patient's Chart, lab work & pertinent test results  History of Anesthesia Complications Negative for: history of anesthetic complications  Airway Mallampati: II  TM Distance: >3 FB Neck ROM: Full    Dental  (+) Dental Advisory Given, Teeth Intact   Pulmonary neg shortness of breath, neg sleep apnea, neg COPD, neg recent URI, former smoker   breath sounds clear to auscultation       Cardiovascular hypertension, Pt. on medications + dysrhythmias Atrial Fibrillation  Rhythm:Irregular  1. Wall motion assessment challenging with afib. Left ventricular  ejection fraction, by estimation, is 50 to 55%. The left ventricle has low  normal function. Left ventricular diastolic parameters are indeterminate.   2. Right ventricular systolic function is normal. The right ventricular  size is normal. There is mildly elevated pulmonary artery systolic  pressure. The estimated right ventricular systolic pressure is 38.8 mmHg.   3. Left atrial size was mildly dilated.   4. Right atrial size was mildly dilated.   5. The mitral valve is degenerative. Mild mitral valve regurgitation.   6. The aortic valve is grossly normal. Aortic valve regurgitation is not  visualized.   7. The inferior vena cava is dilated in size with <50% respiratory  variability, suggesting right atrial pressure of 15 mmHg.     Neuro/Psych negative neurological ROS  negative psych ROS   GI/Hepatic negative GI ROS, Neg liver ROS,,,  Endo/Other  negative endocrine ROS    Renal/GU negative Renal ROS     Musculoskeletal negative musculoskeletal ROS (+)    Abdominal   Peds  Hematology Lab Results      Component                Value               Date                      WBC                      7.2                 06/19/2023                 HGB                      15.6                06/19/2023                HCT                      45.4                06/19/2023                MCV                      102 (H)             06/19/2023                PLT                      206  06/19/2023            eliquis   Anesthesia Other Findings   Reproductive/Obstetrics                              Anesthesia Physical Anesthesia Plan  ASA: 2  Anesthesia Plan: General   Post-op Pain Management: Minimal or no pain anticipated   Induction: Intravenous  PONV Risk Score and Plan: 2 and Treatment may vary due to age or medical condition  Airway Management Planned: Nasal Cannula, Natural Airway and Simple Face Mask  Additional Equipment: None  Intra-op Plan:   Post-operative Plan:   Informed Consent: I have reviewed the patients History and Physical, chart, labs and discussed the procedure including the risks, benefits and alternatives for the proposed anesthesia with the patient or authorized representative who has indicated his/her understanding and acceptance.     Dental advisory given  Plan Discussed with: CRNA  Anesthesia Plan Comments:          Anesthesia Quick Evaluation

## 2023-06-25 NOTE — Anesthesia Procedure Notes (Signed)
Procedure Name: MAC Date/Time: 06/25/2023 12:29 PM  Performed by: Waynard Edwards, CRNAPre-anesthesia Checklist: Patient identified, Emergency Drugs available, Suction available and Patient being monitored Patient Re-evaluated:Patient Re-evaluated prior to induction Oxygen Delivery Method: Nasal cannula Induction Type: IV induction Placement Confirmation: positive ETCO2 Dental Injury: Teeth and Oropharynx as per pre-operative assessment

## 2023-06-25 NOTE — Transfer of Care (Signed)
Immediate Anesthesia Transfer of Care Note  Patient: ERIS HALTEMAN  Procedure(s) Performed: CARDIOVERSION (CATH LAB)  Patient Location: Cath Lab  Anesthesia Type:MAC  Level of Consciousness: awake, alert , and oriented  Airway & Oxygen Therapy: Patient Spontanous Breathing and Patient connected to nasal cannula oxygen  Post-op Assessment: Report given to RN and Post -op Vital signs reviewed and stable  Post vital signs: Reviewed and stable  Last Vitals:  Vitals Value Taken Time  BP 137/98 06/25/23 1224  Temp    Pulse 82 06/25/23 1223  Resp 16 06/25/23 1223  SpO2 94 % 06/25/23 1223  Vitals shown include unfiled device data.  Last Pain:  Vitals:   06/25/23 1045  TempSrc: Temporal         Complications: No notable events documented.

## 2023-06-25 NOTE — Interval H&P Note (Signed)
History and Physical Interval Note:  06/25/2023 11:45 AM  Drew Calderon  has presented today for surgery, with the diagnosis of AFIB.  The various methods of treatment have been discussed with the patient and family. After consideration of risks, benefits and other options for treatment, the patient has consented to  Procedure(s): CARDIOVERSION (CATH LAB) (N/A) as a surgical intervention.  The patient's history has been reviewed, patient examined, no change in status, stable for surgery.  I have reviewed the patient's chart and labs.  Questions were answered to the patient's satisfaction.     Parke Poisson

## 2023-06-25 NOTE — CV Procedure (Signed)
Procedure: Electrical Cardioversion Indications:  Atrial Fibrillation  Procedure Details:  Consent: Risks of procedure as well as the alternatives and risks of each were explained to the (patient/caregiver).  Consent for procedure obtained.  Time Out: Verified patient identification, verified procedure, site/side was marked, verified correct patient position, special equipment/implants available, medications/allergies/relevent history reviewed, required imaging and test results available. PERFORMED.  Patient placed on cardiac monitor, pulse oximetry, supplemental oxygen as necessary.  Sedation given:  propofol per anesthesia Pacer pads placed anterior and posterior chest.  Cardioverted 1 time(s).  Cardioversion with synchronized biphasic 200J shock.  Evaluation: Findings: Post procedure EKG shows:  SR with frequent PACs Complications: None Patient did tolerate procedure well.  Time Spent Directly with the Patient:  30 minutes   Parke Poisson 06/25/2023, 12:34 PM

## 2023-07-02 DIAGNOSIS — N3000 Acute cystitis without hematuria: Secondary | ICD-10-CM | POA: Diagnosis not present

## 2023-07-04 NOTE — Anesthesia Postprocedure Evaluation (Signed)
Anesthesia Post Note  Patient: Drew Calderon  Procedure(s) Performed: CARDIOVERSION (CATH LAB)     Patient location during evaluation: Cath Lab Anesthesia Type: General Level of consciousness: awake and alert Pain management: pain level controlled Vital Signs Assessment: post-procedure vital signs reviewed and stable Respiratory status: spontaneous breathing, nonlabored ventilation and respiratory function stable Cardiovascular status: blood pressure returned to baseline and stable Postop Assessment: no apparent nausea or vomiting Anesthetic complications: no   No notable events documented.  Last Vitals:  Vitals:   06/25/23 1237 06/25/23 1300  BP: (!) 122/109 135/86  Pulse: 68 63  Resp: 16 14  Temp: 36.7 C   SpO2: 94% 95%    Last Pain:  Vitals:   06/25/23 1300  TempSrc:   PainSc: 0-No pain                 Tolulope Pinkett

## 2023-07-16 DIAGNOSIS — N400 Enlarged prostate without lower urinary tract symptoms: Secondary | ICD-10-CM | POA: Diagnosis not present

## 2023-07-16 DIAGNOSIS — I480 Paroxysmal atrial fibrillation: Secondary | ICD-10-CM | POA: Diagnosis not present

## 2023-07-16 DIAGNOSIS — Z87440 Personal history of urinary (tract) infections: Secondary | ICD-10-CM | POA: Diagnosis not present

## 2023-07-16 DIAGNOSIS — D6869 Other thrombophilia: Secondary | ICD-10-CM | POA: Diagnosis not present

## 2023-07-16 DIAGNOSIS — Z8701 Personal history of pneumonia (recurrent): Secondary | ICD-10-CM | POA: Diagnosis not present

## 2023-08-23 ENCOUNTER — Encounter (HOSPITAL_BASED_OUTPATIENT_CLINIC_OR_DEPARTMENT_OTHER): Payer: Self-pay

## 2023-08-28 ENCOUNTER — Encounter (HOSPITAL_BASED_OUTPATIENT_CLINIC_OR_DEPARTMENT_OTHER): Payer: Self-pay | Admitting: Cardiology

## 2023-08-28 ENCOUNTER — Ambulatory Visit (HOSPITAL_BASED_OUTPATIENT_CLINIC_OR_DEPARTMENT_OTHER): Payer: Medicare Other | Admitting: Cardiology

## 2023-08-28 VITALS — BP 118/72 | HR 70 | Ht 72.0 in | Wt 217.7 lb

## 2023-08-28 DIAGNOSIS — D6869 Other thrombophilia: Secondary | ICD-10-CM | POA: Diagnosis not present

## 2023-08-28 DIAGNOSIS — E785 Hyperlipidemia, unspecified: Secondary | ICD-10-CM

## 2023-08-28 DIAGNOSIS — Z7901 Long term (current) use of anticoagulants: Secondary | ICD-10-CM

## 2023-08-28 DIAGNOSIS — I4819 Other persistent atrial fibrillation: Secondary | ICD-10-CM

## 2023-08-28 DIAGNOSIS — I10 Essential (primary) hypertension: Secondary | ICD-10-CM

## 2023-08-28 NOTE — Patient Instructions (Signed)
Medication Instructions:  Your physician recommends that you continue on your current medications as directed. Please refer to the Current Medication list given to you today.  *If you need a refill on your cardiac medications before your next appointment, please call your pharmacy*  Follow-Up: At [facility], you and your health needs are our priority.  As part of our continuing mission to provide you with exceptional heart care, we have created designated Provider Care Teams.  These Care Teams include your primary Cardiologist (physician) and Advanced Practice Providers (APPs -  Physician Assistants and Nurse Practitioners) who all work together to provide you with the care you need, when you need it.  We recommend signing up for the patient portal called "MyChart".  Sign up information is provided on this After Visit Summary.  MyChart is used to connect with patients for Virtual Visits (Telemedicine).  Patients are able to view lab/test results, encounter notes, upcoming appointments, etc.  Non-urgent messages can be sent to your provider as well.   To learn more about what you can do with MyChart, go to [email or web address].    Your next appointment:   6 month(s)  Provider:   Jodelle Red, MD

## 2023-08-28 NOTE — Progress Notes (Unsigned)
  Cardiology Office Note:  .   Date:  08/28/2023  ID:  [name], DOB 1941-04-03, MRN [id number] PCP: Lorenda Ishihara, MD  [facility] Providers Cardiologist:  Jodelle Red, MD {  History of Present Illness: .   MUHSIN Calderon is a 83 y.o. male with PMH atrial fibrillation, hypertension, hyperlipidemia, hemachromatosis. I met him during his hospitalization 04/2023 for atrial fibrillation.  Pertinent CV history: Noted symptoms of LE edema and DOE beginning after Covid infection 01/2023. Presented for evaluation 05/29/23, found to be hypoxic and in new atrial fibrillation. CT showed pneumonia.  Today: Feeling much better. Breathing improved, cough is improved,no wheezing. No fevers/chills. Able to walk several miles yesterday without limitations.  He is still in atrial fibrillation today. We discussed medical management vs. Cardioversion.  Today: Doing very well. Lourena Simmonds today shows he is in afib.  Had cardioversion 06/25/23, converted to sinus with PACs. Cannot tell   ROS: Denies chest pain, shortness of breath at rest or with normal exertion. No PND, orthopnea, LE edema or unexpected weight gain. No syncope or palpitations. ROS otherwise negative except as noted.   Studies Reviewed: Marland Kitchen    EKG:       Physical Exam:   VS:  BP 118/72   Pulse 70   Ht 6' (1.829 m)   Wt 217 lb 11.2 oz (98.7 kg)   SpO2 96%   BMI 29.53 kg/m    Wt Readings from Last 3 Encounters:  08/28/23 217 lb 11.2 oz (98.7 kg)  06/25/23 205 lb (93 kg)  06/06/23 207 lb 12.8 oz (94.3 kg)    GEN: Well nourished, well developed in no acute distress HEENT: Normal, moist mucous membranes NECK: No JVD CARDIAC: irregularly irregular rhythm, normal S1 and S2, no rubs or gallops. No murmur. VASCULAR: Radial and DP pulses 2+ bilaterally. No carotid bruits RESPIRATORY:  Clear to auscultation without rales, wheezing or rhonchi  ABDOMEN: Soft, non-tender, non-distended MUSCULOSKELETAL:   Ambulates independently SKIN: Warm and dry, no edema NEUROLOGIC:  Alert and oriented x 3. No focal neuro deficits noted. PSYCHIATRIC:  Normal affect    ASSESSMENT AND PLAN: .    New atrial fibrillation in the setting of pneumonia -EF ~55% -started on diltiazem 360 mg, apixaban 5 mg BID during his hospitalization -had cardioversion 11/024, converted but then returned to afib -discussed options for management  Hypertension -BP low in the hospital, lisinopril held, amlodipine stopped with start of diltiazem -controlled today, continue   Hyperlipidemia -continue atorvastatin  CV risk counseling and prevention -recommend heart healthy/Mediterranean diet, with whole grains, fruits, vegetable, fish, lean meats, nuts, and olive oil. Limit salt. -recommend moderate walking, 3-5 times/week for 30-50 minutes each session. Aim for at least 150 minutes.week. Goal should be pace of 3 miles/hours, or walking 1.5 miles in 30 minutes -recommend avoidance of tobacco products. Avoid excess alcohol.  Dispo: keep appt on 1/29 with me. Nurse visit in 2 weeks for ECG  Signed, Jodelle Red, MD   Jodelle Red, MD, PhD, [facility]  [facility]  [place]  [facility] at [facility] at [facility] [address] [place], [place] [address] [phone]

## 2023-09-08 ENCOUNTER — Encounter (HOSPITAL_BASED_OUTPATIENT_CLINIC_OR_DEPARTMENT_OTHER): Payer: Self-pay | Admitting: Cardiology

## 2023-09-15 ENCOUNTER — Encounter (HOSPITAL_BASED_OUTPATIENT_CLINIC_OR_DEPARTMENT_OTHER): Payer: Self-pay

## 2023-10-28 ENCOUNTER — Telehealth: Payer: Self-pay | Admitting: Cardiology

## 2023-10-28 DIAGNOSIS — I4891 Unspecified atrial fibrillation: Secondary | ICD-10-CM | POA: Diagnosis not present

## 2023-10-28 DIAGNOSIS — I480 Paroxysmal atrial fibrillation: Secondary | ICD-10-CM | POA: Diagnosis not present

## 2023-10-28 DIAGNOSIS — I10 Essential (primary) hypertension: Secondary | ICD-10-CM | POA: Diagnosis not present

## 2023-10-28 DIAGNOSIS — N400 Enlarged prostate without lower urinary tract symptoms: Secondary | ICD-10-CM | POA: Diagnosis not present

## 2023-10-28 NOTE — Telephone Encounter (Signed)
*  STAT* If patient is at the pharmacy, call can be transferred to refill team.   1. Which medications need to be refilled? (please list name of each medication and dose if known) apixaban (ELIQUIS) 5 MG TABS tablet   2. Which pharmacy/location (including street and city if local pharmacy) is medication to be sent to? [facility] [id number] - [place], [place] - [address]   3. Do they need a 30 day or 90 day supply? 90

## 2023-10-29 ENCOUNTER — Other Ambulatory Visit: Payer: Self-pay

## 2023-10-29 MED ORDER — APIXABAN 5 MG PO TABS: 5.0000 mg | ORAL_TABLET | Freq: Two times a day (BID) | ORAL | 1 refills | Status: DC

## 2023-10-29 NOTE — Telephone Encounter (Signed)
 Prescription refill request for Eliquis received. Indication:afib Last office visit:1/25 Scr:1.02  11/24 Age: 83 Weight:98.7  kg  Prescription refilled

## 2023-11-25 DIAGNOSIS — J40 Bronchitis, not specified as acute or chronic: Secondary | ICD-10-CM | POA: Diagnosis not present

## 2023-11-27 DIAGNOSIS — N400 Enlarged prostate without lower urinary tract symptoms: Secondary | ICD-10-CM | POA: Diagnosis not present

## 2023-11-27 DIAGNOSIS — I4891 Unspecified atrial fibrillation: Secondary | ICD-10-CM | POA: Diagnosis not present

## 2023-11-27 DIAGNOSIS — I480 Paroxysmal atrial fibrillation: Secondary | ICD-10-CM | POA: Diagnosis not present

## 2023-11-27 DIAGNOSIS — I10 Essential (primary) hypertension: Secondary | ICD-10-CM | POA: Diagnosis not present

## 2023-12-28 DIAGNOSIS — I480 Paroxysmal atrial fibrillation: Secondary | ICD-10-CM | POA: Diagnosis not present

## 2023-12-28 DIAGNOSIS — I10 Essential (primary) hypertension: Secondary | ICD-10-CM | POA: Diagnosis not present

## 2023-12-28 DIAGNOSIS — N400 Enlarged prostate without lower urinary tract symptoms: Secondary | ICD-10-CM | POA: Diagnosis not present

## 2023-12-28 DIAGNOSIS — I4891 Unspecified atrial fibrillation: Secondary | ICD-10-CM | POA: Diagnosis not present

## 2024-02-12 ENCOUNTER — Other Ambulatory Visit (HOSPITAL_COMMUNITY): Payer: Self-pay | Admitting: *Deleted

## 2024-02-13 ENCOUNTER — Ambulatory Visit (HOSPITAL_COMMUNITY)
Admission: RE | Admit: 2024-02-13 | Discharge: 2024-02-13 | Disposition: A | Discharge status: Home / Self Care | Source: Ambulatory Visit | Attending: Internal Medicine | Admitting: Internal Medicine

## 2024-02-13 LAB — POCT HEMOGLOBIN-HEMACUE: Hemoglobin: 15.3 g/dL (ref 13.0–17.0)

## 2024-02-13 MED ORDER — LIDOCAINE HCL (PF) 1 % IJ SOLN
2.0000 mL | INTRAMUSCULAR | Status: DC
  Administered 2024-02-13: 2 mL via INTRADERMAL

## 2024-02-13 MED ORDER — LIDOCAINE HCL (PF) 1 % IJ SOLN
INTRAMUSCULAR | Status: AC
  Filled 2024-02-13: qty 2

## 2024-02-27 DIAGNOSIS — N400 Enlarged prostate without lower urinary tract symptoms: Secondary | ICD-10-CM | POA: Diagnosis not present

## 2024-02-27 DIAGNOSIS — I4891 Unspecified atrial fibrillation: Secondary | ICD-10-CM | POA: Diagnosis not present

## 2024-02-27 DIAGNOSIS — I480 Paroxysmal atrial fibrillation: Secondary | ICD-10-CM | POA: Diagnosis not present

## 2024-02-27 DIAGNOSIS — I10 Essential (primary) hypertension: Secondary | ICD-10-CM | POA: Diagnosis not present

## 2024-03-05 DIAGNOSIS — D3617 Benign neoplasm of peripheral nerves and autonomic nervous system of trunk, unspecified: Secondary | ICD-10-CM | POA: Diagnosis not present

## 2024-03-05 DIAGNOSIS — L570 Actinic keratosis: Secondary | ICD-10-CM | POA: Diagnosis not present

## 2024-03-05 DIAGNOSIS — L111 Transient acantholytic dermatosis [Grover]: Secondary | ICD-10-CM | POA: Diagnosis not present

## 2024-03-05 DIAGNOSIS — L821 Other seborrheic keratosis: Secondary | ICD-10-CM | POA: Diagnosis not present

## 2024-03-05 DIAGNOSIS — D1801 Hemangioma of skin and subcutaneous tissue: Secondary | ICD-10-CM | POA: Diagnosis not present

## 2024-03-05 DIAGNOSIS — L720 Epidermal cyst: Secondary | ICD-10-CM | POA: Diagnosis not present

## 2024-03-05 DIAGNOSIS — D485 Neoplasm of uncertain behavior of skin: Secondary | ICD-10-CM | POA: Diagnosis not present

## 2024-03-29 DIAGNOSIS — I480 Paroxysmal atrial fibrillation: Secondary | ICD-10-CM | POA: Diagnosis not present

## 2024-03-29 DIAGNOSIS — I4891 Unspecified atrial fibrillation: Secondary | ICD-10-CM | POA: Diagnosis not present

## 2024-03-29 DIAGNOSIS — I10 Essential (primary) hypertension: Secondary | ICD-10-CM | POA: Diagnosis not present

## 2024-03-29 DIAGNOSIS — N400 Enlarged prostate without lower urinary tract symptoms: Secondary | ICD-10-CM | POA: Diagnosis not present

## 2024-04-21 DIAGNOSIS — I10 Essential (primary) hypertension: Secondary | ICD-10-CM | POA: Diagnosis not present

## 2024-04-21 DIAGNOSIS — Z136 Encounter for screening for cardiovascular disorders: Secondary | ICD-10-CM | POA: Diagnosis not present

## 2024-04-21 DIAGNOSIS — Z7189 Other specified counseling: Secondary | ICD-10-CM | POA: Diagnosis not present

## 2024-04-21 DIAGNOSIS — Z23 Encounter for immunization: Secondary | ICD-10-CM | POA: Diagnosis not present

## 2024-04-21 DIAGNOSIS — I480 Paroxysmal atrial fibrillation: Secondary | ICD-10-CM | POA: Diagnosis not present

## 2024-04-21 DIAGNOSIS — Z1331 Encounter for screening for depression: Secondary | ICD-10-CM | POA: Diagnosis not present

## 2024-04-21 DIAGNOSIS — E7800 Pure hypercholesterolemia, unspecified: Secondary | ICD-10-CM | POA: Diagnosis not present

## 2024-04-23 ENCOUNTER — Other Ambulatory Visit: Payer: Self-pay | Admitting: Cardiology

## 2024-04-23 NOTE — Telephone Encounter (Signed)
 Refill Request.

## 2024-04-23 NOTE — Telephone Encounter (Signed)
 Prescription refill request for Eliquis  received. Indication:afib Last office visit:1/25 Scr:1.02  11/24 Age: 83 Weight:93.9  kg  Prescription refilled

## 2024-04-28 DIAGNOSIS — I10 Essential (primary) hypertension: Secondary | ICD-10-CM | POA: Diagnosis not present

## 2024-04-28 DIAGNOSIS — I480 Paroxysmal atrial fibrillation: Secondary | ICD-10-CM | POA: Diagnosis not present

## 2024-04-28 DIAGNOSIS — I4891 Unspecified atrial fibrillation: Secondary | ICD-10-CM | POA: Diagnosis not present

## 2024-04-28 DIAGNOSIS — N400 Enlarged prostate without lower urinary tract symptoms: Secondary | ICD-10-CM | POA: Diagnosis not present

## 2024-05-07 DIAGNOSIS — H6123 Impacted cerumen, bilateral: Secondary | ICD-10-CM | POA: Diagnosis not present

## 2024-05-07 DIAGNOSIS — H9193 Unspecified hearing loss, bilateral: Secondary | ICD-10-CM | POA: Diagnosis not present

## 2024-05-12 DIAGNOSIS — R972 Elevated prostate specific antigen [PSA]: Secondary | ICD-10-CM | POA: Diagnosis not present

## 2024-05-15 ENCOUNTER — Encounter (HOSPITAL_COMMUNITY)

## 2024-05-22 DIAGNOSIS — J0100 Acute maxillary sinusitis, unspecified: Secondary | ICD-10-CM | POA: Diagnosis not present

## 2024-05-22 DIAGNOSIS — R0601 Orthopnea: Secondary | ICD-10-CM | POA: Diagnosis not present

## 2024-05-22 DIAGNOSIS — I700 Atherosclerosis of aorta: Secondary | ICD-10-CM | POA: Diagnosis not present

## 2024-05-22 DIAGNOSIS — I517 Cardiomegaly: Secondary | ICD-10-CM | POA: Diagnosis not present

## 2024-05-22 DIAGNOSIS — R052 Subacute cough: Secondary | ICD-10-CM | POA: Diagnosis not present

## 2024-05-22 DIAGNOSIS — I771 Stricture of artery: Secondary | ICD-10-CM | POA: Diagnosis not present

## 2024-07-25 ENCOUNTER — Encounter (HOSPITAL_BASED_OUTPATIENT_CLINIC_OR_DEPARTMENT_OTHER): Payer: Self-pay | Admitting: Emergency Medicine

## 2024-07-25 ENCOUNTER — Other Ambulatory Visit: Payer: Self-pay

## 2024-07-25 ENCOUNTER — Emergency Department (HOSPITAL_BASED_OUTPATIENT_CLINIC_OR_DEPARTMENT_OTHER)
Admission: EM | Admit: 2024-07-25 | Discharge: 2024-07-25 | Disposition: A | Discharge status: Home / Self Care | Attending: Emergency Medicine | Admitting: Emergency Medicine

## 2024-07-25 ENCOUNTER — Emergency Department (HOSPITAL_BASED_OUTPATIENT_CLINIC_OR_DEPARTMENT_OTHER)

## 2024-07-25 ENCOUNTER — Telehealth: Payer: Self-pay | Admitting: Cardiology

## 2024-07-25 DIAGNOSIS — M7122 Synovial cyst of popliteal space [Baker], left knee: Secondary | ICD-10-CM | POA: Insufficient documentation

## 2024-07-25 DIAGNOSIS — Z7901 Long term (current) use of anticoagulants: Secondary | ICD-10-CM | POA: Insufficient documentation

## 2024-07-25 DIAGNOSIS — M7989 Other specified soft tissue disorders: Secondary | ICD-10-CM | POA: Diagnosis present

## 2024-07-25 HISTORY — DX: Unspecified atrial fibrillation: I48.91

## 2024-07-25 NOTE — ED Provider Notes (Signed)
 " [place] EMERGENCY DEPARTMENT AT [facility] Provider Note   CSN: [id number] Arrival date & time: 07/25/24  1223     Patient presents with: Leg Pain   Drew Calderon is a 83 y.o. male.   Pleasant 83 year old male here today with swelling in his left leg and calf for the last 3 weeks.  Started with knee pain, notes progressive swelling in his legs.  He takes Eliquis , has not missed any doses.  Going on a trip to [place] in [place] tomorrow.   Leg Pain      Prior to Admission medications  Medication Sig Start Date End Date Taking? Authorizing Provider  allopurinol (ZYLOPRIM) 100 MG tablet Take 100 mg by mouth daily. Patient not taking: Reported on 08/28/2023 04/16/23   [provider]  amoxicillin -clavulanate (AUGMENTIN ) 500-125 MG tablet Take 1 tablet by mouth 3 (three) times daily. Patient not taking: Reported on 08/28/2023 05/31/23   Will Almarie MATSU, MD  aspirin EC 81 MG tablet Take 81 mg by mouth once a week. Patient not taking: Reported on 08/28/2023    [provider]  atorvastatin  (LIPITOR) 10 MG tablet Take 10 mg by mouth daily. 05/20/15   [provider]  diltiazem  (CARDIZEM  LA) 360 MG 24 hr tablet Take 360 mg by mouth daily. 05/31/23   [provider]  ECHINACEA PO Take 1 tablet by mouth daily. Patient not taking: Reported on 08/28/2023    [provider]  ELIQUIS  5 MG TABS tablet TAKE 1 TABLET BY MOUTH 2 TIMES A DAY 04/23/24   Lonni Slain, MD  fluocinonide cream (LIDEX) 0.05 % Apply 1 Application topically 2 (two) times daily.    [provider]  OVER THE COUNTER MEDICATION Take 1 tablet by mouth 2 (two) times daily. Antioxidant multi vitamin Patient not taking: Reported on 08/28/2023    [provider]    Allergies: Patient has no known allergies.    Review of Systems  Updated Vital Signs BP (!) 140/86 (BP Location: Right Arm)   Pulse 88   Temp 98.1 F (36.7 C)   Resp 18    SpO2 94%   Physical Exam Vitals and nursing note reviewed.  Constitutional:      Appearance: He is not toxic-appearing.  Eyes:     Extraocular Movements: Extraocular movements intact.  Cardiovascular:     Rate and Rhythm: Normal rate.  Musculoskeletal:     Cervical back: Normal range of motion.     Comments: Mild swelling of the left lower extremity there is edema.  There is no significant pain, tenderness or erythema around the knee joint.  Patient does have some swelling in the popliteal fossa  Skin:    General: Skin is warm.     Comments: No significant erythema, no crepitus, no breakdown  Neurological:     General: No focal deficit present.     Mental Status: He is alert.     (all labs ordered are listed, but only abnormal results are displayed) Labs Reviewed - No data to display  EKG: None  Radiology: US  Venous Img Lower Unilateral Left Result Date: 07/25/2024 CLINICAL DATA:  Left lower extremity pain and swelling x3 weeks. EXAM: LEFT LOWER EXTREMITY VENOUS DOPPLER ULTRASOUND TECHNIQUE: Gray-scale sonography with graded compression, as well as color Doppler and duplex ultrasound were performed to evaluate the lower extremity deep venous systems from the level of the common femoral vein and including the common femoral, femoral, profunda femoral, popliteal and calf veins including  the posterior tibial, peroneal and gastrocnemius veins when visible. The superficial great saphenous vein was also interrogated. Spectral Doppler was utilized to evaluate flow at rest and with distal augmentation maneuvers in the common femoral, femoral and popliteal veins. COMPARISON:  None Available. FINDINGS: Contralateral Common Femoral Vein: Respiratory phasicity is normal and symmetric with the symptomatic side. No evidence of thrombus. Normal compressibility. Common Femoral Vein: No evidence of thrombus. Normal compressibility, respiratory phasicity and response to augmentation. Saphenofemoral  Junction: No evidence of thrombus. Normal compressibility and flow on color Doppler imaging. Profunda Femoral Vein: No evidence of thrombus. Normal compressibility and flow on color Doppler imaging. Femoral Vein: No evidence of thrombus. Normal compressibility, respiratory phasicity and response to augmentation. Popliteal Vein: No evidence of thrombus. Normal compressibility, respiratory phasicity and response to augmentation. Calf Veins: No evidence of thrombus. Normal compressibility and flow on color Doppler imaging. Superficial Great Saphenous Vein: No evidence of thrombus. Normal compressibility. Venous Reflux:  None. Other Findings: A 12.4 cm x 2.6 cm x 3.2 cm complex fluid collection is seen extending from the medial aspect of the left popliteal fossa into the superior portion of the left calf. IMPRESSION: 1. No evidence of deep venous thrombosis within the LEFT lower extremity. 2. Large, complex LEFT Baker's cyst, as described above. Electronically Signed   By: Suzen Dials M.D.   On: 07/25/2024 14:33     Procedures   Medications Ordered in the ED - No data to display                                  Medical Decision Making Pleasant 83 year old male here today with left leg swelling.  He takes Eliquis .  Differential diagnoses include Baker's cyst, consider DVT, less likely cellulitis, less likely septic arthritis.  Plan-patient overall well-appearing.  His DVT ultrasound is negative for DVT but does show a complex Baker's cyst, likely contributing to the swelling and pain that he is having.  Patient very active, appears much younger than his stated age.  Counseled on rest, ice, compression, elevation.  Will provide him with orthopedic follow-up for when he returns from his trip.        Final diagnoses:  Synovial cyst of left popliteal space    ED Discharge Orders     None          Mannie Fairy DASEN, DO 07/25/24 1555  "

## 2024-07-25 NOTE — Discharge Instructions (Addendum)
 You have a Baker's cyst.  I recommend wearing compression socks for about 12 hours/day.  Keep taking your Eliquis .  When you return from your trip, I recommend calling the orthopedic doctor I have included in your discharge paperwork.

## 2024-07-25 NOTE — ED Triage Notes (Signed)
 Left calf pain Swelling X 3 weeks Started with knee pain, knee pain improved Denies chest pain no sob Seen at [facility] walking sent for DVT R/o  Current takes eliquis  5mg  BID without missed doses  Leaving for vacation tomorrow so wants to get it looked at

## 2024-07-25 NOTE — Telephone Encounter (Signed)
 Outpatient service line:  Patient's wife calling reporting that she has concerns of a DVT in his right leg because it is swollen and painful around the calf area.  Also reports knee related pains but with known arthritis.  He is on Eliquis  and has been compliant although less likely still possibly could have DVT.  No way to evaluate that over the phone, has moderate risk based off Wells criteria.  Encouraged him to go to the emergency room for further evaluation.

## 2024-08-30 DEATH — deceased
